# Patient Record
Sex: Female | Born: 1935
Health system: Southern US, Community
[De-identification: ages and names within clinical notes are randomized; demographics above are authoritative.]

## PROBLEM LIST (undated history)

## (undated) DIAGNOSIS — L309 Dermatitis, unspecified: Secondary | ICD-10-CM

## (undated) DIAGNOSIS — R911 Solitary pulmonary nodule: Secondary | ICD-10-CM

## (undated) DIAGNOSIS — K219 Gastro-esophageal reflux disease without esophagitis: Secondary | ICD-10-CM

## (undated) DIAGNOSIS — M1990 Unspecified osteoarthritis, unspecified site: Secondary | ICD-10-CM

## (undated) DIAGNOSIS — J449 Chronic obstructive pulmonary disease, unspecified: Secondary | ICD-10-CM

## (undated) DIAGNOSIS — I10 Essential (primary) hypertension: Secondary | ICD-10-CM

## (undated) DIAGNOSIS — K449 Diaphragmatic hernia without obstruction or gangrene: Secondary | ICD-10-CM

## (undated) DIAGNOSIS — IMO0002 Reserved for concepts with insufficient information to code with codable children: Secondary | ICD-10-CM

## (undated) DIAGNOSIS — E785 Hyperlipidemia, unspecified: Secondary | ICD-10-CM

## (undated) DIAGNOSIS — G450 Vertebro-basilar artery syndrome: Secondary | ICD-10-CM

## (undated) DIAGNOSIS — E039 Hypothyroidism, unspecified: Secondary | ICD-10-CM

## (undated) DIAGNOSIS — M353 Polymyalgia rheumatica: Secondary | ICD-10-CM

## (undated) HISTORY — PX: OTHER SURGICAL HISTORY: SHX169

## (undated) HISTORY — DX: Gastro-esophageal reflux disease without esophagitis: K21.9

## (undated) HISTORY — DX: Dermatitis, unspecified: L30.9

## (undated) HISTORY — PX: TOTAL ABDOMINAL HYSTERECTOMY: SHX209

## (undated) HISTORY — DX: Reserved for concepts with insufficient information to code with codable children: IMO0002

## (undated) HISTORY — PX: BREAST CYST EXCISION: SHX579

## (undated) HISTORY — DX: Diaphragmatic hernia without obstruction or gangrene: K44.9

## (undated) HISTORY — PX: CAROTID ENDARTERECTOMY: SUR193

## (undated) HISTORY — DX: Polymyalgia rheumatica: M35.3

## (undated) HISTORY — DX: Essential (primary) hypertension: I10

## (undated) HISTORY — DX: Solitary pulmonary nodule: R91.1

## (undated) HISTORY — PX: RHINOPLASTY: SUR1284

## (undated) HISTORY — DX: Chronic obstructive pulmonary disease, unspecified: J44.9

## (undated) HISTORY — DX: Hyperlipidemia, unspecified: E78.5

## (undated) HISTORY — DX: Hypothyroidism, unspecified: E03.9

## (undated) HISTORY — DX: Vertebro-basilar artery syndrome: G45.0

## (undated) HISTORY — DX: Unspecified osteoarthritis, unspecified site: M19.90

---

## 1999-07-22 ENCOUNTER — Other Ambulatory Visit: Admission: RE | Admit: 1999-07-22 | Discharge: 1999-07-22 | Payer: Self-pay | Admitting: *Deleted

## 2000-09-04 ENCOUNTER — Other Ambulatory Visit: Admission: RE | Admit: 2000-09-04 | Discharge: 2000-09-04 | Payer: Self-pay | Admitting: *Deleted

## 2001-07-01 ENCOUNTER — Ambulatory Visit (HOSPITAL_COMMUNITY): Admission: RE | Admit: 2001-07-01 | Discharge: 2001-07-01 | Payer: Self-pay | Admitting: Gastroenterology

## 2001-09-19 ENCOUNTER — Emergency Department (HOSPITAL_COMMUNITY): Admission: EM | Admit: 2001-09-19 | Discharge: 2001-09-19 | Payer: Self-pay | Admitting: Emergency Medicine

## 2002-12-01 ENCOUNTER — Encounter: Payer: Self-pay | Admitting: Internal Medicine

## 2002-12-01 ENCOUNTER — Encounter: Admission: RE | Admit: 2002-12-01 | Discharge: 2002-12-01 | Payer: Self-pay | Admitting: Internal Medicine

## 2002-12-30 ENCOUNTER — Encounter: Payer: Self-pay | Admitting: Internal Medicine

## 2002-12-30 ENCOUNTER — Encounter: Admission: RE | Admit: 2002-12-30 | Discharge: 2002-12-30 | Payer: Self-pay | Admitting: Internal Medicine

## 2003-02-23 ENCOUNTER — Ambulatory Visit (HOSPITAL_COMMUNITY): Admission: RE | Admit: 2003-02-23 | Discharge: 2003-02-23 | Payer: Self-pay | Admitting: Gastroenterology

## 2003-04-05 ENCOUNTER — Encounter: Admission: RE | Admit: 2003-04-05 | Discharge: 2003-04-05 | Payer: Self-pay | Admitting: Gastroenterology

## 2003-10-26 ENCOUNTER — Encounter: Admission: RE | Admit: 2003-10-26 | Discharge: 2003-10-26 | Payer: Self-pay | Admitting: Internal Medicine

## 2003-11-02 ENCOUNTER — Encounter: Admission: RE | Admit: 2003-11-02 | Discharge: 2003-11-16 | Payer: Self-pay | Admitting: Internal Medicine

## 2005-11-10 ENCOUNTER — Encounter: Admission: RE | Admit: 2005-11-10 | Discharge: 2005-11-10 | Payer: Self-pay | Admitting: Internal Medicine

## 2008-12-22 ENCOUNTER — Encounter: Payer: Self-pay | Admitting: Pulmonary Disease

## 2009-01-02 ENCOUNTER — Encounter (INDEPENDENT_AMBULATORY_CARE_PROVIDER_SITE_OTHER): Payer: Self-pay | Admitting: Interventional Radiology

## 2009-01-02 ENCOUNTER — Ambulatory Visit (HOSPITAL_COMMUNITY): Admission: RE | Admit: 2009-01-02 | Discharge: 2009-01-02 | Payer: Self-pay | Admitting: Internal Medicine

## 2009-01-31 ENCOUNTER — Ambulatory Visit: Payer: Self-pay | Admitting: Pulmonary Disease

## 2009-01-31 DIAGNOSIS — M353 Polymyalgia rheumatica: Secondary | ICD-10-CM | POA: Insufficient documentation

## 2009-01-31 DIAGNOSIS — M1990 Unspecified osteoarthritis, unspecified site: Secondary | ICD-10-CM | POA: Insufficient documentation

## 2009-01-31 DIAGNOSIS — J984 Other disorders of lung: Secondary | ICD-10-CM | POA: Insufficient documentation

## 2009-01-31 DIAGNOSIS — K449 Diaphragmatic hernia without obstruction or gangrene: Secondary | ICD-10-CM | POA: Insufficient documentation

## 2009-01-31 DIAGNOSIS — L259 Unspecified contact dermatitis, unspecified cause: Secondary | ICD-10-CM | POA: Insufficient documentation

## 2009-01-31 DIAGNOSIS — E785 Hyperlipidemia, unspecified: Secondary | ICD-10-CM | POA: Insufficient documentation

## 2009-01-31 DIAGNOSIS — IMO0002 Reserved for concepts with insufficient information to code with codable children: Secondary | ICD-10-CM | POA: Insufficient documentation

## 2009-01-31 DIAGNOSIS — I10 Essential (primary) hypertension: Secondary | ICD-10-CM | POA: Insufficient documentation

## 2009-01-31 DIAGNOSIS — E039 Hypothyroidism, unspecified: Secondary | ICD-10-CM | POA: Insufficient documentation

## 2009-01-31 DIAGNOSIS — K219 Gastro-esophageal reflux disease without esophagitis: Secondary | ICD-10-CM | POA: Insufficient documentation

## 2009-02-02 ENCOUNTER — Ambulatory Visit: Payer: Self-pay | Admitting: Pulmonary Disease

## 2009-02-24 ENCOUNTER — Encounter: Payer: Self-pay | Admitting: Pulmonary Disease

## 2009-02-27 ENCOUNTER — Telehealth: Payer: Self-pay | Admitting: Pulmonary Disease

## 2009-03-05 ENCOUNTER — Ambulatory Visit: Payer: Self-pay | Admitting: Cardiovascular Disease

## 2009-03-09 ENCOUNTER — Ambulatory Visit: Payer: Self-pay | Admitting: Pulmonary Disease

## 2009-03-09 DIAGNOSIS — J479 Bronchiectasis, uncomplicated: Secondary | ICD-10-CM | POA: Insufficient documentation

## 2009-03-09 DIAGNOSIS — J449 Chronic obstructive pulmonary disease, unspecified: Secondary | ICD-10-CM | POA: Insufficient documentation

## 2009-04-26 ENCOUNTER — Ambulatory Visit: Payer: Self-pay | Admitting: Pulmonary Disease

## 2009-05-08 ENCOUNTER — Telehealth (INDEPENDENT_AMBULATORY_CARE_PROVIDER_SITE_OTHER): Payer: Self-pay | Admitting: *Deleted

## 2009-05-08 ENCOUNTER — Ambulatory Visit: Payer: Self-pay | Admitting: Internal Medicine

## 2009-06-28 ENCOUNTER — Ambulatory Visit: Payer: Self-pay | Admitting: Cardiology

## 2009-07-06 ENCOUNTER — Ambulatory Visit: Payer: Self-pay | Admitting: Pulmonary Disease

## 2009-07-06 DIAGNOSIS — K117 Disturbances of salivary secretion: Secondary | ICD-10-CM | POA: Insufficient documentation

## 2009-12-26 ENCOUNTER — Ambulatory Visit: Payer: Self-pay | Admitting: Cardiology

## 2009-12-28 ENCOUNTER — Ambulatory Visit: Payer: Self-pay | Admitting: Pulmonary Disease

## 2010-06-20 NOTE — Assessment & Plan Note (Signed)
Summary: f/u CT chest results/LC   Visit Type:  Follow-up Primary Sibel Khurana/Referring Maylee Bare:  Dr. Creola Corn  CC:  Patient here to discuss CT scan results.  The patient c/o increased sob with exertion and when outside in the heat/humidity..  History of Present Illness: 77 yo female with COPD, bronchiectasis, and pulmonary nodule.  Her breathing has been doing okay.  She has occasional cough and clear sputum.  She denies fever, epistaxis, or hemoptysis.  She notices some trouble with her breathing with the hot weather, but otherwise feels like she is able to do most of her activities w/o too much breathing trouble.  She does not get wheezing much, and has not needed to use her rescue inhaler.  She is now down to 5 mg prednisone per day.  CT of Chest  Procedure date:  12/26/2009  Findings:      CT CHEST WITHOUT CONTRAST   Technique:  Multidetector CT imaging of the chest was performed following the standard protocol without IV contrast.   Comparison: 06/28/2009 and 03/05/2009   Findings: There is no axillary lymphadenopathy.  No mediastinal or hilar lymphadenopathy.  Heart size is normal.  No pericardial or pleural effusion.  Coronary artery calcification is noted.   There is some bronchiectasis in the posterior right upper lobe. Spiculated posterior right upper lobe pulmonary nodule may have some associated mineralization, and measures 7 x 10 mm today, stable since the previous study when measured 8 x 11 mm and unchanged when comparing back to the study from 03/05/2009.   Bronchiectasis and scarring again noted in the right middle lobe. There is some bronchiectatic change with nodular scarring seen in the anterior right lower lobe.  4 mm posterior right lower lobe pulmonary nodule on image 35 is unchanged.   Bronchiectasis and scarring in the posterior lingula is unchanged. Tiny posteromedial left lower lobe pulmonary nodule is less apparent on today's study.     IMPRESSION: The dominant 7 x 10 mm right upper lobe pulmonary nodule is unchanged in the nearly 1-year interval since our oldest comparison study.  This is reassuring.  An additional follow-up CT chest without contrast in 12 months is recommended.   Scattered areas of bronchiectasis and nodular scarring are seen in the lower lungs bilaterally, unchanged.  Imaging features are probably related to previous atypical infection.   Current Medications (verified): 1)  Spiriva Handihaler 18 Mcg Caps (Tiotropium Bromide Monohydrate) .... One Puff Once Daily 2)  Symbicort 160-4.5 Mcg/act Aero (Budesonide-Formoterol Fumarate) .... Two Puffs Two Times A Day 3)  Proair Hfa 108 (90 Base) Mcg/act Aers (Albuterol Sulfate) .... Two Puffs Up To Four Times Per Day As Needed 4)  Toprol Xl 50 Mg Xr24h-Tab (Metoprolol Succinate) .Marland Kitchen.. 1 By Mouth Daily 5)  Aspir-Low 81 Mg Tbec (Aspirin) .Marland Kitchen.. 1 By Mouth Daily 6)  Amitriptyline Hcl 50 Mg Tabs (Amitriptyline Hcl) .Marland Kitchen.. 1 By Mouth Daily 7)  Multivitamins  Tabs (Multiple Vitamin) .Marland Kitchen.. 1 By Mouth Daily 8)  Synthroid 88 Mcg Tabs (Levothyroxine Sodium) .Marland Kitchen.. 1 By Mouth Daily 9)  Prilosec 20 Mg Cpdr (Omeprazole) .Marland Kitchen.. 1 By Mouth Daily As Needed 10)  Prednisone 5 Mg Tabs (Prednisone) .Marland Kitchen.. 1 By Mouth Daily  Allergies (verified): 1)  ! Sulfa  Past History:  Past Medical History: Polymyalgia rheumatica Degenerative disk disase Osteoarthritis Eczema Hiatal hernia GERD Gastric ulcer 2004 Hypothyroidism Hypertension Hyperlipidemia Vertebrobasilar insufficiency COPD       - PFT 02/02/09 FEV1 0.99(53%), FVC 1.58(58%), FEV1% 63, TLC 3.79(79%), DLCO 51%, +  BD Bronchiectasis Right upper lobe pulmonary nodule      - 9 x 12 mm CT chest 12/22/08, 03/05/09, 06/28/09, 12/26/09  Past Surgical History: Reviewed history from 01/31/2009 and no changes required. TAH  1975 Benign breast cysts rhinoplasty  1976 Broken tail bone Carotid Endarterectomy Right  Vital  Signs:  Patient profile:   77 year old female Height:      63 inches (160.02 cm) Weight:      166.38 pounds (75.63 kg) BMI:     29.58 O2 Sat:      94 % on Room air Temp:     97.9 degrees F (36.61 degrees C) oral Pulse rate:   91 / minute BP sitting:   130 / 68  (left arm) Cuff size:   regular  Vitals Entered By: Michel Bickers CMA (December 28, 2009 3:18 PM)  O2 Sat at Rest %:  94 O2 Flow:  Room air CC: Patient here to discuss CT scan results.  The patient c/o increased sob with exertion and when outside in the heat/humidity. Comments Medications reviewed with the patient. Daytime phone verified. Michel Bickers [facility]  December 28, 2009 3:25 PM   Physical Exam  General:  obese.   Nose:  no deformity, discharge, inflammation, or lesions Mouth:  no deformity or lesions Neck:  no JVD.   Lungs:  prolonged exhalation, no wheeze or crackels Heart:  regular rate and rhythm, S1, S2 without murmurs, rubs, gallops, or clicks Extremities:  no clubbing, cyanosis, edema, or deformity noted Cervical Nodes:  no significant adenopathy   Impression & Recommendations:  Problem # 1:  PULMONARY NODULE, RIGHT UPPER LOBE (ICD-518.89) She has stable radiographic findings.  Will plan to repeat CT chest in one year.  She does not have any symptoms that would suggest an active infection at present.  Problem # 2:  COPD (ICD-496)  She appears stable on her current regimen.  Will see if she can taper off some of her inhalers.  Will have her use spiriva every other day for 2 to 3 weeks.  If she remains stable, she is to then stop spiriva.  Problem # 3:  BRONCHIECTASIS (ICD-494.0)  As above.  Problem # 4:  POLYMYALGIA RHEUMATICA (ICD-725) She is on prednisone per primary care.  Medications Added to Medication List This Visit: 1)  Prilosec 20 Mg Cpdr (Omeprazole) .Marland Kitchen.. 1 by mouth daily as needed  Complete Medication List: 1)  Spiriva Handihaler 18 Mcg Caps (Tiotropium bromide monohydrate) .... One puff once  daily 2)  Symbicort 160-4.5 Mcg/act Aero (Budesonide-formoterol fumarate) .... Two puffs two times a day 3)  Proair Hfa 108 (90 Base) Mcg/act Aers (Albuterol sulfate) .... Two puffs up to four times per day as needed 4)  Toprol Xl 50 Mg Xr24h-tab (Metoprolol succinate) .Marland Kitchen.. 1 by mouth daily 5)  Aspir-low 81 Mg Tbec (Aspirin) .Marland Kitchen.. 1 by mouth daily 6)  Amitriptyline Hcl 50 Mg Tabs (Amitriptyline hcl) .Marland Kitchen.. 1 by mouth daily 7)  Multivitamins Tabs (Multiple vitamin) .Marland Kitchen.. 1 by mouth daily 8)  Synthroid 88 Mcg Tabs (Levothyroxine sodium) .Marland Kitchen.. 1 by mouth daily 9)  Prilosec 20 Mg Cpdr (Omeprazole) .Marland Kitchen.. 1 by mouth daily as needed 10)  Prednisone 5 Mg Tabs (Prednisone) .Marland Kitchen.. 1 by mouth daily  Patient Instructions: 1)  Spiriva every other day for 2 to 3 weeks.  If okay then stop spiriva. 2)  Symbicort two puffs two times a day 3)  Proair two puffs up to four times per day as  needed 4)  CT chest in one year 5)  Follow up in 6 months  Appended Document: f/u CT chest results/LC

## 2010-06-20 NOTE — Assessment & Plan Note (Signed)
Summary: rov after ct/apc   Primary Provider/Referring Provider:  Dr. Creola Corn  CC:  Patient here to discuss Ct chest results. Patients husband passed away this week.Marland Kitchen  History of Present Illness: 77 yo female with COPD, bronchiectasis, and pulmonary nodule.  Her breathing has been doing well.  She does not have much cough, wheeze, or sputum.  She denies fever or hemoptysis.  She has not had chest pain or swelling.  She has been getting dryness in her mouth.  Unfortunately her husband passed away recently after having a stroke.  CT of Chest  Procedure date:  06/28/2009  Findings:      Findings: The chest wall is unremarkable and stable.  No breast masses, supraclavicular or axillary adenopathy.   The heart is normal in size.  No pericardial effusion.  No mediastinal or hilar adenopathy.  Stable atherosclerotic changes involving the aorta but no focal aneurysm.  Coronary artery calcifications are again demonstrated.  The esophagus is grossly normal and stable.   Examination of the lung parenchyma demonstrates stable middle lobe and lingular bronchiectasis and parenchymal scarring.  Most of the small nodules seen on the prior study have resolved.  No change in the right upper lobe nodule which measures 10.0 x 8.0 mm. There is also a small stable right lower lobe nodule which measures 4 mm. There is a small stable calcified granuloma in the right lower lobe on image number 39 and small adjacent stable nodule on image number 40.   No focal pulmonary filtrates, pulmonary edema or pleural effusions.   The upper abdomen is unremarkable.   IMPRESSION:   1.  Persistent right middle lobe and lingular bronchiectasis and parenchymal scarring. 2.  Resolution of several of the smaller nodular opacities seen on the prior study. 3.  The remaining pulmonary nodules are stable.   Current Medications (verified): 1)  Spiriva Handihaler 18 Mcg Caps (Tiotropium Bromide Monohydrate)  .... One Puff Once Daily 2)  Symbicort 160-4.5 Mcg/act Aero (Budesonide-Formoterol Fumarate) .... Two Puffs Two Times A Day 3)  Proair Hfa 108 (90 Base) Mcg/act Aers (Albuterol Sulfate) .... Two Puffs Up To Four Times Per Day As Needed 4)  Toprol Xl 50 Mg Xr24h-Tab (Metoprolol Succinate) .Marland Kitchen.. 1 By Mouth Daily 5)  Aspir-Low 81 Mg Tbec (Aspirin) .Marland Kitchen.. 1 By Mouth Daily 6)  Amitriptyline Hcl 50 Mg Tabs (Amitriptyline Hcl) .Marland Kitchen.. 1 By Mouth Daily 7)  Multivitamins  Tabs (Multiple Vitamin) .Marland Kitchen.. 1 By Mouth Daily 8)  Synthroid 88 Mcg Tabs (Levothyroxine Sodium) .Marland Kitchen.. 1 By Mouth Daily 9)  Prilosec 20 Mg Cpdr (Omeprazole) .Marland Kitchen.. 1 By Mouth Daily 10)  Vitamin D 1000 Unit Tabs (Cholecalciferol) .Marland Kitchen.. 1 By Mouth Daily 11)  Prednisone 5 Mg Tabs (Prednisone) .Marland Kitchen.. 1 By Mouth Daily 12)  Caltrate 600+d 600-400 Mg-Unit Tabs (Calcium Carbonate-Vitamin D) .Marland Kitchen.. 1 By Mouth Two Times A Day 13)  Mucinex 600 Mg Xr12h-Tab (Guaifenesin) .... One By Mouth Two Times A Day As Needed 14)  Diflucan 100 Mg Tabs (Fluconazole) .... One Daily X 3 Days  Allergies (verified): 1)  ! Sulfa  Past History:  Past Surgical History: Last updated: 01/31/2009 TAH  1975 Benign breast cysts rhinoplasty  1976 Broken tail bone Carotid Endarterectomy Right  Past Medical History: Polymyalgia rheumatica Degenerative disk disase Osteoarthritis Eczema Hiatal hernia GERD Gastric ulcer 2004 Hypothyroidism Hypertension Hyperlipidemia Vertebrobasilar insufficiency COPD       - PFT 02/02/09 FEV1 0.99(53%), FVC 1.58(58%), FEV1% 63, TLC 3.79(79%), DLCO 51%, +BD Bronchiectasis Right upper lobe  pulmonary nodule      - 9 x 12 mm CT chest 12/22/08, 03/05/09, 06/28/09  Social History: Smoked 2 packs per day for 2 years.  Quit in 1960's. No alcohol use Widowed Worked staining glass  Vital Signs:  Patient profile:   77 year old female Height:      63 inches (160.02 cm) Weight:      168 pounds (76.36 kg) BMI:     29.87 O2 Sat:      92 %  on Room air Temp:     97.9 degrees F (36.61 degrees C) oral Pulse rate:   104 / minute BP sitting:   144 / 80  (right arm) Cuff size:   regular  Vitals Entered By: Michel Bickers CMA (July 06, 2009 1:33 PM)  O2 Sat at Rest %:  92 O2 Flow:  Room air  Physical Exam  General:  obese.   Nose:  no deformity, discharge, inflammation, or lesions Mouth:  no deformity or lesions Neck:  no JVD.   Lungs:  prolonged exhalation, no wheeze or crackels Heart:  regular rate and rhythm, S1, S2 without murmurs, rubs, gallops, or clicks Abdomen:  bowel sounds positive; abdomen soft and non-tender without masses, or organomegaly Extremities:  no clubbing, cyanosis, edema, or deformity noted Cervical Nodes:  no significant adenopathy   Impression & Recommendations:  Problem # 1:  COPD (ICD-496) She appears stable on her current regimen.  Problem # 2:  BRONCHIECTASIS (ICD-494.0) As above.  Problem # 3:  PULMONARY NODULE, RIGHT UPPER LOBE (ICD-518.89) He CT chest showed stable appearance of nodule.  Will plan to repeat CT chest in August 2011.  Problem # 4:  DRY MOUTH (ICD-527.7) She has noticed dry mouth.  I explained that use of elavil can contribute to this.  Advised that she should follow up with her primary care for further assessment of this.  Complete Medication List: 1)  Spiriva Handihaler 18 Mcg Caps (Tiotropium bromide monohydrate) .... One puff once daily 2)  Symbicort 160-4.5 Mcg/act Aero (Budesonide-formoterol fumarate) .... Two puffs two times a day 3)  Proair Hfa 108 (90 Base) Mcg/act Aers (Albuterol sulfate) .... Two puffs up to four times per day as needed 4)  Toprol Xl 50 Mg Xr24h-tab (Metoprolol succinate) .Marland Kitchen.. 1 by mouth daily 5)  Aspir-low 81 Mg Tbec (Aspirin) .Marland Kitchen.. 1 by mouth daily 6)  Amitriptyline Hcl 50 Mg Tabs (Amitriptyline hcl) .Marland Kitchen.. 1 by mouth daily 7)  Multivitamins Tabs (Multiple vitamin) .Marland Kitchen.. 1 by mouth daily 8)  Synthroid 88 Mcg Tabs (Levothyroxine sodium) .Marland Kitchen..  1 by mouth daily 9)  Prilosec 20 Mg Cpdr (Omeprazole) .Marland Kitchen.. 1 by mouth daily 10)  Vitamin D 1000 Unit Tabs (Cholecalciferol) .Marland Kitchen.. 1 by mouth daily 11)  Prednisone 5 Mg Tabs (Prednisone) .Marland Kitchen.. 1 by mouth daily 12)  Caltrate 600+d 600-400 Mg-unit Tabs (Calcium carbonate-vitamin d) .Marland Kitchen.. 1 by mouth two times a day 13)  Mucinex 600 Mg Xr12h-tab (Guaifenesin) .... One by mouth two times a day as needed 14)  Diflucan 100 Mg Tabs (Fluconazole) .... One daily x 3 days  Other Orders: Radiology Referral (Radiology) Est. Patient Level III (20254)  Patient Instructions: 1)  Repeat CT chest in 6 months 2)  Follow up after CT chest   Immunization History:  Pneumovax Immunization History:    Pneumovax:  historical (01/31/2009)

## 2010-07-08 ENCOUNTER — Ambulatory Visit (INDEPENDENT_AMBULATORY_CARE_PROVIDER_SITE_OTHER): Payer: MEDICARE | Admitting: Pulmonary Disease

## 2010-07-08 ENCOUNTER — Encounter: Payer: Self-pay | Admitting: Pulmonary Disease

## 2010-07-08 DIAGNOSIS — M353 Polymyalgia rheumatica: Secondary | ICD-10-CM

## 2010-07-08 DIAGNOSIS — J449 Chronic obstructive pulmonary disease, unspecified: Secondary | ICD-10-CM

## 2010-07-08 DIAGNOSIS — J984 Other disorders of lung: Secondary | ICD-10-CM

## 2010-07-09 ENCOUNTER — Other Ambulatory Visit: Payer: Self-pay | Admitting: Pulmonary Disease

## 2010-07-09 DIAGNOSIS — R911 Solitary pulmonary nodule: Secondary | ICD-10-CM

## 2010-07-16 NOTE — Assessment & Plan Note (Signed)
Summary: rov//sh   Primary Provider/Referring Provider:  Dr. Creola Corn  CC:  follow up. Pt states she has had an increase in sob x few days and feels tired. Marland Kitchen  History of Present Illness: 77 yo female with COPD, bronchiectasis, and pulmonary nodule.  Since her last visit she stopped spiriva.  She has noticed more trouble with her breathing, especially over the last few days.  She does not feel more congested, and has not been coughing more than usual.  She feels like she gets a rattle in her chest.  She denies fever, and her sinuses have been okay.  She denies sick exposures.  She is currently on 4 mg prednisone once daily.  She has not been using proair.  She was not sure if she could use it except in emergencies.  Current Medications (verified): 1)  Symbicort 160-4.5 Mcg/act Aero (Budesonide-Formoterol Fumarate) .... Two Puffs Two Times A Day 2)  Proair Hfa 108 (90 Base) Mcg/act Aers (Albuterol Sulfate) .... Two Puffs Up To Four Times Per Day As Needed 3)  Toprol Xl 50 Mg Xr24h-Tab (Metoprolol Succinate) .Marland Kitchen.. 1 By Mouth Daily 4)  Aspir-Low 81 Mg Tbec (Aspirin) .Marland Kitchen.. 1 By Mouth Daily 5)  Amitriptyline Hcl 50 Mg Tabs (Amitriptyline Hcl) .Marland Kitchen.. 1 By Mouth Daily 6)  Multivitamins  Tabs (Multiple Vitamin) .Marland Kitchen.. 1 By Mouth Daily 7)  Synthroid 75 Mcg Tabs (Levothyroxine Sodium) .... Once Daily 8)  Prilosec 20 Mg Cpdr (Omeprazole) .Marland Kitchen.. 1 By Mouth Daily As Needed 9)  Prednisone 1 Mg Tabs (Prednisone) .... 4 Tabs Daily  Allergies (verified): 1)  ! Sulfa  Past History:  Past Medical History: Reviewed history from 12/28/2009 and no changes required. Polymyalgia rheumatica Degenerative disk disase Osteoarthritis Eczema Hiatal hernia GERD Gastric ulcer 2004 Hypothyroidism Hypertension Hyperlipidemia Vertebrobasilar insufficiency COPD       - PFT 02/02/09 FEV1 0.99(53%), FVC 1.58(58%), FEV1% 63, TLC 3.79(79%), DLCO 51%, +BD Bronchiectasis Right upper lobe pulmonary nodule      - 9 x 12  mm CT chest 12/22/08, 03/05/09, 06/28/09, 12/26/09  Past Surgical History: Reviewed history from 01/31/2009 and no changes required. TAH  1975 Benign breast cysts rhinoplasty  1976 Broken tail bone Carotid Endarterectomy Right  Family History: Reviewed history from 01/31/2009 and no changes required. Family History COPD -- mother Family History Breast Cancer -- sister Liver failure -- son Family History Emphysema --mother and father Family History MI/Heart Attack--father  Social History: Reviewed history from 07/06/2009 and no changes required. Smoked 2 packs per day for 2 years.  Quit in 1960's. No alcohol use Widowed Worked staining glass  Vital Signs:  Patient profile:   77 year old female Height:      63 inches Weight:      158.25 pounds BMI:     28.13 O2 Sat:      90 % on Room air Temp:     98.3 degrees F oral Pulse rate:   85 / minute BP sitting:   140 / 70  (left arm) Cuff size:   regular  Vitals Entered By: Carver Fila (July 08, 2010 3:59 PM)  O2 Flow:  Room air CC: follow up. Pt states she has had an increase in sob x few days and feels tired.  Comments meds and allergies updated Phone number updated Carver Fila  July 08, 2010 3:59 PM    Physical Exam  General:  obese.   Nose:  no deformity, discharge, inflammation, or lesions Mouth:  no deformity or lesions  Neck:  no JVD.   Lungs:  prolonged exhalation, no wheeze or crackels Heart:  regular rate and rhythm, S1, S2 without murmurs, rubs, gallops, or clicks Extremities:  no clubbing, cyanosis, edema, or deformity noted Neurologic:  normal CN II-XII.   Cervical Nodes:  no significant adenopathy   Impression & Recommendations:  Problem # 1:  COPD (ICD-496) Will resume her spiriva.  She is to continue symbicort.  Advised her that it is okay to use her proair on a more regular basis if needed.  Problem # 2:  BRONCHIECTASIS (ICD-494.0) As above.  Problem # 3:  PULMONARY NODULE, RIGHT UPPER  LOBE (ICD-518.89)  She will need CT chest follow up in August 2012  Problem # 4:  POLYMYALGIA RHEUMATICA (ICD-725)  She is on prednisone per primary care.  Medications Added to Medication List This Visit: 1)  Spiriva Handihaler 18 Mcg Caps (Tiotropium bromide monohydrate) .... One puff once daily 2)  Synthroid 75 Mcg Tabs (Levothyroxine sodium) .... Once daily 3)  Prednisone 1 Mg Tabs (Prednisone) .... 4 tabs daily  Complete Medication List: 1)  Spiriva Handihaler 18 Mcg Caps (Tiotropium bromide monohydrate) .... One puff once daily 2)  Symbicort 160-4.5 Mcg/act Aero (Budesonide-formoterol fumarate) .... Two puffs two times a day 3)  Proair Hfa 108 (90 Base) Mcg/act Aers (Albuterol sulfate) .... Two puffs up to four times per day as needed 4)  Toprol Xl 50 Mg Xr24h-tab (Metoprolol succinate) .Marland Kitchen.. 1 by mouth daily 5)  Aspir-low 81 Mg Tbec (Aspirin) .Marland Kitchen.. 1 by mouth daily 6)  Amitriptyline Hcl 50 Mg Tabs (Amitriptyline hcl) .Marland Kitchen.. 1 by mouth daily 7)  Multivitamins Tabs (Multiple vitamin) .Marland Kitchen.. 1 by mouth daily 8)  Synthroid 75 Mcg Tabs (Levothyroxine sodium) .... Once daily 9)  Prilosec 20 Mg Cpdr (Omeprazole) .Marland Kitchen.. 1 by mouth daily as needed 10)  Prednisone 1 Mg Tabs (Prednisone) .... 4 tabs daily  Other Orders: Radiology Referral (Radiology) Est. Patient Level IV (60454)  Patient Instructions: 1)  Restart spiriva one puff once daily  2)  Okay to use proair up to four times per day as needed for cough, wheeze, chest congestion, or shortness of breath 3)  Follow up in August 2012 after CT chest Prescriptions: SPIRIVA HANDIHALER 18 MCG CAPS (TIOTROPIUM BROMIDE MONOHYDRATE) one puff once daily  #30 x 6   Entered and Authorized by:   [name] MD   Signed by:   [name] MD on 07/08/2010   Method used:   Electronically to        [facility]. [phone]* (retail)       [phone]       [place], [place]  [address]       Ph: [phone]        Fax: [phone]   RxID:   [id number]    Immunization History:  Influenza Immunization History:    Influenza:  historical (02/16/2010)   Appended Document: walk update Ambulatory Pulse Oximetry  Resting; HR__81___    02 93%ra  Lap1 (185 feet)   HR_88____   02 Sat__93%ra___ Lap2 (185 feet)   HR_91____   02 Sat__94%ra___    Lap3 (185 feet)   HR_96____   02 Sat___94%ra__  _x__Test Completed without Difficulty ___Test Stopped due to: Carver Fila  July 08, 2010 4:52 PM      Clinical Lists Changes

## 2010-08-24 LAB — CBC
HCT: 43.5 % (ref 36.0–46.0)
Hemoglobin: 14.3 g/dL (ref 12.0–15.0)
MCHC: 32.9 g/dL (ref 30.0–36.0)
MCV: 94.5 fL (ref 78.0–100.0)
Platelets: 266 10*3/uL (ref 150–400)
RBC: 4.6 MIL/uL (ref 3.87–5.11)
RDW: 13.9 % (ref 11.5–15.5)
WBC: 10.1 10*3/uL (ref 4.0–10.5)

## 2010-08-24 LAB — PROTIME-INR
INR: 1 (ref 0.00–1.49)
Prothrombin Time: 13.3 seconds (ref 11.6–15.2)

## 2010-08-24 LAB — APTT: aPTT: 29 seconds (ref 24–37)

## 2010-10-04 NOTE — Op Note (Signed)
   Jenna Carrillo, Jenna Carrillo                         ACCOUNT NO.:  [id number]   MEDICAL RECORD NO.:  [id number]                   PATIENT TYPE:  AMB   LOCATION:  ENDO                                 [facility]ACILITY[facility]  [facility]   PHYSICIAN:  James L. Malon Kindle., M.D.          DATE OF BIRTH:  [birth year]   DATE OF PROCEDURE:  02/23/2003  DATE OF DISCHARGE:                                 OPERATIVE REPORT   PROCEDURE:  Esophagogastroduodenoscopy with biopsy.   MEDICATIONS:  1. Cetacaine spray.  2. Fentanyl 50 mcg.  3. Versed __________ mg IV.   INDICATION:  Persistent epigastric pain despite fairly normal upper GI and  gallbladder ultrasound.   DESCRIPTION OF PROCEDURE:  The procedure explained to the patient and  consent obtained.  The patient in the left lateral decubitus position, the  Olympus scope was inserted and advanced.  We advanced down into the stomach.  The ileocecal valve was identified and passed.  The duodenum including the  bulb and second portion were seen well.  There were no ulcerations or  inflammation.  The scope was withdrawn.  The pyloric channel was normal.  On  the anterior wall of the antrum was a 0.5 cm shallow ulcer.  It appeared to  be healing.  In an area away from the ulcer, a biopsy was taken for rapid  urease test for Helicobacter.  No other ulcerations or inflammation was  seen.  The fundus and cardia were seen well in the retroflexed view and were  normal.  The scope was withdrawn.  The distal and proximal esophagus were  seen well and were normal.  The GE junction was widely patent with free  reflux.   ASSESSMENT:  1. Small gastric ulcer, probably the source of her continuing symptoms.  2. Gastroesophageal reflux disease.   PLAN:  1. We will continue on Aciphex b.i.d.  2. Check the results of CLOtest.  3. Keep her off of nonsteroidal drugs.  4. We will see her back in the office in two months.  We will need a follow-     up upper GI.                                 James L. Malon Kindle., M.D.    Waldron Session  D:  02/23/2003  T:  02/23/2003  Job:  [id number]   cc:   Gwen Pounds, M.D.  [address]  [place]  [place] [address]  Fax: [phone]

## 2010-10-04 NOTE — Procedures (Signed)
[place]  Patient:    Jenna Carrillo, Jenna Carrillo Visit Number: [id number] MRN: [id number]          Service Type: END Location: ENDO Attending Physician:  Orland Mustard Dictated by:   Llana Aliment. Randa Evens, M.D. Proc. Date: 07/01/01 Admit Date:  07/01/2001   CC:         Creola Corn, MD   Procedure Report  DATE OF BIRTH:  05-10-1931  PROCEDURE PERFORMED:  Colonoscopy.  ENDOSCOPIST:  Llana Aliment. Randa Evens, M.D.  MEDICATIONS USED:  Fentanyl 75 mcg, Versed 7.5 mg IV.  INSTRUMENT:  Olympus pediatric colonoscope.  INDICATIONS:  Strong family history of colon cancer.  DESCRIPTION OF PROCEDURE:  The procedure had been explained to the patient and consent obtained.  With the patient in the left lateral decubitus position, the Olympus pediatric video colonoscope was inserted and advanced under direct visualization.  The prep was excellent and we were able to advance using abdominal pressure and multiple position changes to the cecum.  The ileocecal valve was identified.  The scope was withdrawn.  The cecum, ascending colon, hepatic flexure, transverse colon, splenic flexure, descending and sigmoid colon were seen well upon withdrawal.  No polyps were seen, no significant diverticular disease.  The patient tolerated the procedure well.  Maintained on low flow oxygen and pulse oximeter throughout the procedure. ASSESSMENT:  Essentially normal colonoscopy to the cecum.  PLAN:  Will recommend repeating in five years. Dictated by:   Llana Aliment. Randa Evens, M.D. Attending Physician:  Orland Mustard DD:  07/01/01 TD:  07/01/01 Job: [id number]

## 2010-12-12 ENCOUNTER — Other Ambulatory Visit: Payer: Self-pay | Admitting: Pulmonary Disease

## 2011-01-02 ENCOUNTER — Ambulatory Visit (INDEPENDENT_AMBULATORY_CARE_PROVIDER_SITE_OTHER)
Admission: RE | Admit: 2011-01-02 | Discharge: 2011-01-02 | Disposition: A | Discharge status: Home / Self Care | Payer: Medicare Other | Source: Ambulatory Visit | Attending: Pulmonary Disease | Admitting: Pulmonary Disease

## 2011-01-02 ENCOUNTER — Telehealth: Payer: Self-pay | Admitting: Pulmonary Disease

## 2011-01-02 DIAGNOSIS — R911 Solitary pulmonary nodule: Secondary | ICD-10-CM

## 2011-01-02 DIAGNOSIS — J984 Other disorders of lung: Secondary | ICD-10-CM

## 2011-01-02 NOTE — Telephone Encounter (Signed)
ATC Melissa back from Radiology.  The above # left was incorrect.  This was the # for a supervisor at [facility] and he was unaware of anyone named Melissa in his dept.  ATC [address] where pt had Ct done but was informed by Misty Stanley there is no one named Melissa there either.  Therefore will sign off on this message and wait for [facility] to call back.

## 2011-01-03 ENCOUNTER — Telehealth: Payer: Self-pay | Admitting: Pulmonary Disease

## 2011-01-03 DIAGNOSIS — J984 Other disorders of lung: Secondary | ICD-10-CM

## 2011-01-03 DIAGNOSIS — J449 Chronic obstructive pulmonary disease, unspecified: Secondary | ICD-10-CM

## 2011-01-03 NOTE — Telephone Encounter (Signed)
Pt is coming in 8/23 at 9 am to discuss results.

## 2011-01-03 NOTE — Telephone Encounter (Signed)
CT chest 01/02/11>>11x7 mm spiculated RUL nodule, 4 mm RLL post nodule, 6 mm RLL nodule with central calcification, patchy opacity with BTX RML and lingula  Will have my nurse schedule ROV to discuss results.

## 2011-01-08 ENCOUNTER — Encounter: Payer: Self-pay | Admitting: Pulmonary Disease

## 2011-01-09 ENCOUNTER — Encounter: Payer: Self-pay | Admitting: Pulmonary Disease

## 2011-01-09 ENCOUNTER — Telehealth: Payer: Self-pay | Admitting: Pulmonary Disease

## 2011-01-09 ENCOUNTER — Other Ambulatory Visit: Payer: Self-pay | Admitting: Pulmonary Disease

## 2011-01-09 ENCOUNTER — Ambulatory Visit (INDEPENDENT_AMBULATORY_CARE_PROVIDER_SITE_OTHER): Payer: Medicare Other | Admitting: Pulmonary Disease

## 2011-01-09 DIAGNOSIS — J449 Chronic obstructive pulmonary disease, unspecified: Secondary | ICD-10-CM

## 2011-01-09 DIAGNOSIS — J984 Other disorders of lung: Secondary | ICD-10-CM

## 2011-01-09 DIAGNOSIS — M353 Polymyalgia rheumatica: Secondary | ICD-10-CM

## 2011-01-09 MED ORDER — ALBUTEROL SULFATE (2.5 MG/3ML) 0.083% IN NEBU: INHALATION_SOLUTION | RESPIRATORY_TRACT | Status: DC

## 2011-01-09 MED ORDER — BUDESONIDE 0.25 MG/2ML IN SUSP: 0.2500 mg | Freq: Two times a day (BID) | RESPIRATORY_TRACT | Status: DC

## 2011-01-09 NOTE — Patient Instructions (Signed)
Stop symbicort Albuterol one vial nebulized twice per day followed by budesonide one vial nebulized twice per day (morning and night) Continue spiriva one puff per day Proair two puffs as needed for cough, wheezing, or chest congestion Follow up in 6 months

## 2011-01-09 NOTE — Assessment & Plan Note (Signed)
She is on prednisone per primary care. 

## 2011-01-09 NOTE — Assessment & Plan Note (Signed)
As above.

## 2011-01-09 NOTE — Assessment & Plan Note (Addendum)
She has increasing dyspnea.  I am worried she may not be using her inhalers effectively.  Will have her stop symbicort.  Will arrange for home nebulizer and start scheduled albuterol and budesonide.  She is to continue spiriva.  Advised her to call if this does not help.

## 2011-01-09 NOTE — Progress Notes (Signed)
Subjective:    Patient ID: Jenna Carrillo, female    DOB: 1931-04-16, 77 y.o.   MRN: [id number]  HPI  77 yo female with COPD, bronchiectasis, and pulmonary nodule.   She is here to follow up CT chest.    She has been getting more short of breath and needing to use her proair more often.  She has been using spiriva and symbicort.  She has a cough with chest congestion.  She gets occasional wheeze, but no fever or hemoptysis.  She has been feeling more tired.  She has been under stress related to her home estate planning.  Past Medical History  Diagnosis Date  . Polymyalgia rheumatica   . Degenerative disk disease   . Osteoarthritis   . Eczema   . Hiatal hernia   . GERD (gastroesophageal reflux disease)   . Gastric ulcer   . Hypothyroidism   . Hypertension   . Hyperlipidemia   . Vertebrobasilar insufficiency   . COPD (chronic obstructive pulmonary disease)   . Bronchiectasis   . Pulmonary nodule, right      Family History  Problem Relation Age of Onset  . COPD Mother   . Breast cancer Mother   . Liver disease Son   . Emphysema Mother   . Emphysema Father   . Heart attack Father      History    Occupational History  .      staining glass   Social History Main Topics  . Smoking status: Former Smoker -- 2.0 packs/day for 2 years    Types: Cigarettes    Quit date: 05/19/1958  . Smokeless tobacco: Not on file  . Alcohol Use: Not on file  . Drug Use: Not on file  . Sexually Active: Not on file   Allergies  Allergen Reactions  . Sulfonamide Derivatives     Review of Systems     Objective:   Physical Exam BP 128/68  Pulse 68  Temp(Src) 97.5 F (36.4 C) (Oral)  Ht 5' 3.5" (1.613 m)  Wt 154 lb 6.4 oz (70.035 kg)  BMI 26.92 kg/m2  SpO2 96%  General: obese.  Nose: no deformity, discharge, inflammation, or lesions  Mouth: no deformity or lesions  Neck: no JVD.  Lungs: prolonged exhalation, no wheeze or crackels  Heart: regular rate and rhythm, S1, S2  without murmurs, rubs, gallops, or clicks  Extremities: no clubbing, cyanosis, edema, or deformity noted  Neurologic: normal CN II-XII.  Cervical Nodes: no significant adenopathy   CT CHEST WITHOUT CONTRAST 12/24/10:  Technique: Multidetector CT imaging of the chest was performed following the standard protocol without IV contrast.   Comparison: None.  Findings: The visualized thyroid is unremarkable.  11 x 7 mm spiculated opacity in the right upper lobe (series 3/image 20). 4 mm nodule in the posterior right lower lobe (series 3/image 36). 6 mm pulmonary nodule with central calcification in the right lower lobe (series 3/image 42). Additional smaller tiny pulmonary nodules bilaterally.  Patchy opacity with bronchiectasis in the right middle lobe and lingula, likely the sequela of prior atypical infection such as MAI.  No pleural effusion or pneumothorax. Heart is normal in size. No pericardial effusion. Coronary atherosclerosis. Atherosclerotic calcifications of the aortic arch.  No suspicious mediastinal or axillary lymphadenopathy.  Visualized upper abdomen is notable for hepatic steatosis.  Degenerative changes of the visualized thoracolumbar spine.   IMPRESSION:  11 mm spiculated opacity in the right upper lobe. In the absence of prior imaging for  comparison, the appearance is worrisome for neoplasm, and PET CT or possible biopsy is suggested. If prior imaging becomes available and is submitted for comparison, this report can be addended with a comparison.  Additional smaller bilateral pulmonary nodules, measuring up to 6 mm in the right lower lobe.  Patchy opacity with bronchiectasis in the right middle lobe and lingula, likely the sequela of prior atypical infection such as MAI.  Spirometry 01/09/11>>FEV1 0.97 (49%), FEV1% 67    Assessment & Plan:   PULMONARY NODULE, RIGHT UPPER LOBE Rt upper lobe nodule was 9x12 mm on CT chest from December 22 2008.  It is now 11x7 mm on CT chest from  January 02, 2011, so essentially unchanged.  No further interventions are needed for this.  COPD She has increasing dyspnea.  I am worried she may not be using her inhalers effectively.  Will have her stop symbicort.  Will arrange for home nebulizer and start scheduled albuterol and budesonide.  She is to continue spiriva.  Advised her to call if this does not help.  BRONCHIECTASIS As above.  Polymyalgia rheumatica She is on prednisone per primary care.    Updated Medication List Outpatient Encounter Prescriptions as of 01/09/2011  Medication Sig Dispense Refill  . albuterol (PROAIR HFA) 108 (90 BASE) MCG/ACT inhaler Inhale 2 puffs into the lungs every 4 (four) hours as needed.        Marland Kitchen amitriptyline (ELAVIL) 50 MG tablet Take 50 mg by mouth at bedtime.        Marland Kitchen aspirin 81 MG tablet Take 81 mg by mouth daily.        Marland Kitchen levothyroxine (SYNTHROID, LEVOTHROID) 75 MCG tablet Take 88 mcg by mouth daily.       . metoprolol (TOPROL-XL) 50 MG 24 hr tablet Take 50 mg by mouth 2 (two) times daily.       . Multiple Vitamin (MULTIVITAMIN) tablet Take 1 tablet by mouth daily.        Marland Kitchen omeprazole (PRILOSEC) 20 MG capsule Take 20 mg by mouth daily as needed.        . predniSONE (DELTASONE) 1 MG tablet 4 tablets a day       . tiotropium (SPIRIVA) 18 MCG inhalation capsule Place 18 mcg into inhaler and inhale daily.        Marland Kitchen DISCONTD: SYMBICORT 160-4.5 MCG/ACT inhaler INHALE 2 PUFFS BY MOUTH TWICE A DAY  10.2 g  2  . albuterol (PROVENTIL) (2.5 MG/3ML) 0.083% nebulizer solution One vial nebulized twice per day and every 6 hours as needed  75 mL  5  . budesonide (PULMICORT) 0.25 MG/2ML nebulizer solution Take 2 mLs (0.25 mg total) by nebulization 2 (two) times daily.  60 mL  5

## 2011-01-09 NOTE — Telephone Encounter (Signed)
ATC [facility] to verify the msg, the pharm closes at 5 pm. [facility] tomorrow am.

## 2011-01-09 NOTE — Assessment & Plan Note (Signed)
Rt upper lobe nodule was 9x12 mm on CT chest from December 22 2008.  It is now 11x7 mm on CT chest from January 02, 2011, so essentially unchanged.  No further interventions are needed for this.

## 2011-01-10 MED ORDER — BUDESONIDE 0.25 MG/2ML IN SUSP: 0.2500 mg | Freq: Two times a day (BID) | RESPIRATORY_TRACT | Status: DC

## 2011-01-10 MED ORDER — ALBUTEROL SULFATE (2.5 MG/3ML) 0.083% IN NEBU: INHALATION_SOLUTION | RESPIRATORY_TRACT | Status: DC

## 2011-01-10 NOTE — Telephone Encounter (Signed)
Rxs for albuterol and budesonide were sent to [facility] for only a 2 wk supply.  However, per Harriett Sine, they only bill for a 1 month supply.  She is requesting new rxs to cover the 1 month supply  Fax # [phone].  Rxs printed and given to Mindy to have VS sign.

## 2011-01-10 NOTE — Telephone Encounter (Signed)
Rx's has been signed and faxed to # given

## 2011-03-12 ENCOUNTER — Telehealth: Payer: Self-pay | Admitting: Pulmonary Disease

## 2011-03-12 DIAGNOSIS — J449 Chronic obstructive pulmonary disease, unspecified: Secondary | ICD-10-CM

## 2011-03-12 NOTE — Telephone Encounter (Signed)
Called and spoke with pt. Pt states she has had a history of low blood sugar years ago and "knows what the symptoms are."  However she states she doesn't have a way to test her blood sugars.  She states every time she uses albuterol and/or budesonide nebs she gets "weak, shaky and heart races."  Informed pt these are all side effects that the albuterol can cause.   Pt states she "cannot tolerate these symptoms" and requests VS recs.  Vs, please advise.  Thanks.

## 2011-03-13 MED ORDER — ALBUTEROL SULFATE (2.5 MG/3ML) 0.083% IN NEBU: 2.5000 mg | INHALATION_SOLUTION | RESPIRATORY_TRACT | Status: DC | PRN

## 2011-03-13 NOTE — Telephone Encounter (Signed)
Explained to pt that her symptoms are related to using albuterol.  She has been using this once or twice per day because she was instructed to.  She does not feel like she needs to use this otherwise.  Advised her she can use albuterol only as needed, but not on a scheduled basis.  She is to continue spiriva and pulmicort.

## 2011-07-22 ENCOUNTER — Other Ambulatory Visit: Payer: Self-pay | Admitting: Pulmonary Disease

## 2011-07-22 MED ORDER — TIOTROPIUM BROMIDE MONOHYDRATE 18 MCG IN CAPS: 18.0000 ug | ORAL_CAPSULE | Freq: Every day | RESPIRATORY_TRACT | Status: DC

## 2011-08-08 DIAGNOSIS — IMO0002 Reserved for concepts with insufficient information to code with codable children: Secondary | ICD-10-CM | POA: Insufficient documentation

## 2011-11-03 ENCOUNTER — Encounter: Payer: Self-pay | Admitting: Pulmonary Disease

## 2011-11-03 ENCOUNTER — Ambulatory Visit (INDEPENDENT_AMBULATORY_CARE_PROVIDER_SITE_OTHER): Payer: Medicare Other | Admitting: Pulmonary Disease

## 2011-11-03 VITALS — BP 132/66 | HR 77 | Temp 98.3°F | Ht 63.5 in | Wt 155.0 lb

## 2011-11-03 DIAGNOSIS — M353 Polymyalgia rheumatica: Secondary | ICD-10-CM

## 2011-11-03 DIAGNOSIS — J449 Chronic obstructive pulmonary disease, unspecified: Secondary | ICD-10-CM

## 2011-11-03 DIAGNOSIS — J479 Bronchiectasis, uncomplicated: Secondary | ICD-10-CM

## 2011-11-03 NOTE — Patient Instructions (Signed)
Follow up in one year >> call if help needed sooner 

## 2011-11-03 NOTE — Assessment & Plan Note (Signed)
She is maintained on spiriva and prn albuterol.

## 2011-11-03 NOTE — Assessment & Plan Note (Signed)
She has not been using nebulizer therapy.  Will have this discontinued.  She is likely getting some benefit from prednisone for this.  As such, she can forgo inhaled corticosteroid use for now.

## 2011-11-03 NOTE — Progress Notes (Signed)
Chief Complaint  Patient presents with  . Follow-up    Breathing has been okay. has a cough and wheezing occasionally but not very much. pt no longer wants to use the nebulizer medications/machine    History of Present Illness: Jenna SOBH is a 78 y.o. female with COPD, and bronchiectasis.   She has not noticed any benefit from nebulizer therapy.  She has not used this for months, and wants it removed.  She continues to use spiriva.  She uses proair once every few weeks.  She had her prednisone dose decreased to 3 mg daily, and has been doing okay.  She gets occasional cough, especially when talking on the phone.  She denies wheeze, sputum, hemoptysis, or fever.  Past Medical History  Diagnosis Date  . Polymyalgia rheumatica   . Degenerative disk disease   . Osteoarthritis   . Eczema   . Hiatal hernia   . GERD (gastroesophageal reflux disease)   . Gastric ulcer   . Hypothyroidism   . Hypertension   . Hyperlipidemia   . Vertebrobasilar insufficiency   . COPD (chronic obstructive pulmonary disease)   . Bronchiectasis   . Pulmonary nodule, right     Past Surgical History  Procedure Date  . Total abdominal hysterectomy   . Breast cyst excision     right  . Rhinoplasty   . Broken tail bone   . Carotid endarterectomy     right    Outpatient Encounter Prescriptions as of 11/03/2011  Medication Sig Dispense Refill  . albuterol (PROAIR HFA) 108 (90 BASE) MCG/ACT inhaler Inhale 2 puffs into the lungs every 4 (four) hours as needed.        Marland Kitchen amitriptyline (ELAVIL) 50 MG tablet Take 50 mg by mouth at bedtime.        Marland Kitchen aspirin 81 MG tablet Take 81 mg by mouth daily.        Marland Kitchen levothyroxine (SYNTHROID, LEVOTHROID) 75 MCG tablet Take 88 mcg by mouth daily.       . metoprolol (TOPROL-XL) 50 MG 24 hr tablet Take 50 mg by mouth 2 (two) times daily.       . Multiple Vitamin (MULTIVITAMIN) tablet Take 1 tablet by mouth daily.        Marland Kitchen omeprazole (PRILOSEC) 20 MG capsule Take 20 mg by  mouth daily as needed.        . predniSONE (DELTASONE) 1 MG tablet 3 tablets a day      . tiotropium (SPIRIVA) 18 MCG inhalation capsule Place 1 capsule (18 mcg total) into inhaler and inhale daily.  30 capsule  6  . DISCONTD: albuterol (PROVENTIL) (2.5 MG/3ML) 0.083% nebulizer solution Take 3 mLs (2.5 mg total) by nebulization every 4 (four) hours as needed for wheezing or shortness of breath. One vial nebulized twice per day and every 6 hours as needed  60 vial  5  . DISCONTD: budesonide (PULMICORT) 0.25 MG/2ML nebulizer solution Take 2 mLs (0.25 mg total) by nebulization 2 (two) times daily.  120 mL  5    Allergies  Allergen Reactions  . Sulfonamide Derivatives     Physical Exam:  Blood pressure 132/66, pulse 77, temperature 98.3 F (36.8 C), temperature source Oral, height 5' 3.5" (1.613 m), weight 155 lb (70.308 kg), SpO2 97.00%. Body mass index is 27.03 kg/(m^2). Wt Readings from Last 2 Encounters:  11/03/11 155 lb (70.308 kg)  01/09/11 154 lb 6.4 oz (70.035 kg)   General - no distress HEENT - no  sinus tenderness, no oral exudate, no LAN Cardiac - s1s2 regular, no murmur Chest - decreased breath sounds, no wheeze/rales Abd - soft, non tender Ext - no edema Neuro - normal strength   Assessment/Plan:  Jenna Helling, MD [place] [facility]/Critical [facility]/Sleep Pager:  [phone] 11/03/2011, 3:42 PM

## 2011-11-03 NOTE — Assessment & Plan Note (Signed)
She is on prednisone per primary care. 

## 2012-01-05 ENCOUNTER — Telehealth: Payer: Self-pay | Admitting: Pulmonary Disease

## 2012-01-05 DIAGNOSIS — J479 Bronchiectasis, uncomplicated: Secondary | ICD-10-CM

## 2012-01-05 NOTE — Telephone Encounter (Signed)
BRONCHIECTASIS Jenna Helling, MD 11/03/2011 3:42 PM Signed  She has not been using nebulizer therapy. Will have this discontinued. She is likely getting some benefit from prednisone for this. As such, she can forgo inhaled corticosteroid use for now.  I have sent order for this and pt is aware. Nothing further was needed

## 2012-03-31 ENCOUNTER — Other Ambulatory Visit: Payer: Self-pay | Admitting: *Deleted

## 2012-03-31 MED ORDER — TIOTROPIUM BROMIDE MONOHYDRATE 18 MCG IN CAPS: 18.0000 ug | ORAL_CAPSULE | Freq: Every day | RESPIRATORY_TRACT | Status: DC

## 2012-11-12 ENCOUNTER — Encounter: Payer: Self-pay | Admitting: Pulmonary Disease

## 2012-11-12 ENCOUNTER — Ambulatory Visit (INDEPENDENT_AMBULATORY_CARE_PROVIDER_SITE_OTHER)
Admission: RE | Admit: 2012-11-12 | Discharge: 2012-11-12 | Disposition: A | Discharge status: Home / Self Care | Payer: Medicare Other | Source: Ambulatory Visit | Attending: Pulmonary Disease | Admitting: Pulmonary Disease

## 2012-11-12 ENCOUNTER — Ambulatory Visit (INDEPENDENT_AMBULATORY_CARE_PROVIDER_SITE_OTHER): Payer: Medicare Other | Admitting: Pulmonary Disease

## 2012-11-12 VITALS — BP 120/66 | HR 66 | Temp 97.0°F | Ht 63.0 in | Wt 146.0 lb

## 2012-11-12 DIAGNOSIS — J449 Chronic obstructive pulmonary disease, unspecified: Secondary | ICD-10-CM

## 2012-11-12 DIAGNOSIS — M353 Polymyalgia rheumatica: Secondary | ICD-10-CM

## 2012-11-12 DIAGNOSIS — J479 Bronchiectasis, uncomplicated: Secondary | ICD-10-CM

## 2012-11-12 DIAGNOSIS — J4489 Other specified chronic obstructive pulmonary disease: Secondary | ICD-10-CM

## 2012-11-12 DIAGNOSIS — J309 Allergic rhinitis, unspecified: Secondary | ICD-10-CM

## 2012-11-12 MED ORDER — UMECLIDINIUM-VILANTEROL 62.5-25 MCG/INH IN AEPB: 1.0000 | INHALATION_SPRAY | Freq: Every day | RESPIRATORY_TRACT | Status: DC

## 2012-11-12 MED ORDER — MOMETASONE FUROATE 50 MCG/ACT NA SUSP: 2.0000 | Freq: Every day | NASAL | Status: DC

## 2012-11-12 NOTE — Assessment & Plan Note (Signed)
She is on prednisone per primary care. 

## 2012-11-12 NOTE — Assessment & Plan Note (Signed)
Some of her cough could be related to rhinitis and post nasal drip.  Will have her use nasonex daily for 2 weeks, then as needed.

## 2012-11-12 NOTE — Progress Notes (Signed)
Chief Complaint  Patient presents with  . COPD    Breathing has gotten worse. Reports SOB, coughing and wheezing. Denies chest tightness.    History of Present Illness: Jenna Carrillo is a 79 y.o. female COPD, and bronchiectasis with history of polymyalgia rheumatica.   Her breathing has been getting progressively worse.  She has more chest congestion and cough.  She is not bringing up sputum.  She has more allergies and sinus congestion.  She has been getting post-nasal drip.  She is not using anything for her allergies.  She has to clear her throat frequently.  She denies chest pain or fever.  She uses her proair twice per day, and this helps.  She remains on prednisone 3 mg daily. She has not had recent chest xray.  TESTS: PFT 02/02/09 >> FEV1 0.99(53%), FVC 1.58(58%), FEV1% 63, TLC 3.79(79%), DLCO 51%, +BD Spirometry 01/09/11 >> FEV1 0.97 (49%), FEV1% 67   Jenna Carrillo  has a past medical history of Polymyalgia rheumatica; Degenerative disk disease; Osteoarthritis; Eczema; Hiatal hernia; GERD (gastroesophageal reflux disease); Gastric ulcer; Hypothyroidism; Hypertension; Hyperlipidemia; Vertebrobasilar insufficiency; COPD (chronic obstructive pulmonary disease); Bronchiectasis; and Pulmonary nodule, right.  Jenna Carrillo  has past surgical history that includes Total abdominal hysterectomy; Breast cyst excision; Rhinoplasty; broken tail bone; and Carotid endarterectomy.  Prior to Admission medications   Medication Sig Start Date End Date Taking? Authorizing Provider  albuterol (PROAIR HFA) 108 (90 BASE) MCG/ACT inhaler Inhale 2 puffs into the lungs every 4 (four) hours as needed.     Yes Historical Provider, MD  amitriptyline (ELAVIL) 50 MG tablet Take 50 mg by mouth at bedtime.     Yes Historical Provider, MD  aspirin 81 MG tablet Take 81 mg by mouth daily.     Yes Historical Provider, MD  levothyroxine (SYNTHROID, LEVOTHROID) 88 MCG tablet Take 88 mcg by mouth daily before  breakfast.   Yes Historical Provider, MD  metoprolol (TOPROL-XL) 50 MG 24 hr tablet Take 50 mg by mouth 2 (two) times daily.    Yes Historical Provider, MD  Multiple Vitamin (MULTIVITAMIN) tablet Take 1 tablet by mouth daily.     Yes Historical Provider, MD  omeprazole (PRILOSEC) 20 MG capsule Take 20 mg by mouth daily as needed.     Yes Historical Provider, MD  predniSONE (DELTASONE) 1 MG tablet 3 tablets a day   Yes Historical Provider, MD  tiotropium (SPIRIVA) 18 MCG inhalation capsule Place 1 capsule (18 mcg total) into inhaler and inhale daily. 03/31/12  Yes [name], MD    Allergies  Allergen Reactions  . Sulfonamide Derivatives      Physical Exam:  General - No distress ENT - No sinus tenderness, clear nasal discharge, no oral exudate, no LAN Cardiac - s1s2 regular, no murmur Chest - scattered rhonchi, no wheeze Back - No focal tenderness Abd - Soft, non-tender Ext - No edema Neuro - Normal strength Skin - No rashes Psych - normal mood, and behavior   Assessment/Plan:  [name], MD [place] [facility]/Critical Care/Sleep Pager:  [phone]

## 2012-11-12 NOTE — Patient Instructions (Signed)
Nasonex two sprays each nostril daily for two weeks, then as needed Chest xray today >> will call with results Anoro one puff daily >> call in one week to update status with anoro Do not use spiriva while using anoro Follow up in 6 months

## 2012-11-12 NOTE — Assessment & Plan Note (Signed)
Will repeat chest x-ray. 

## 2012-11-12 NOTE — Assessment & Plan Note (Signed)
She has increased cough.  Will try her on anoro in place of spiriva.  She will call back in one week to let us know if anoro is working better.

## 2012-11-16 ENCOUNTER — Telehealth: Payer: Self-pay | Admitting: Pulmonary Disease

## 2012-11-16 NOTE — Telephone Encounter (Signed)
Pt is aware of CXR results. Nothing further was needed. 

## 2012-11-16 NOTE — Telephone Encounter (Signed)
Patient returning call.

## 2012-11-16 NOTE — Telephone Encounter (Signed)
LMTCB

## 2012-11-16 NOTE — Telephone Encounter (Signed)
Dg Chest 2 View  11/12/2012    *RADIOLOGY REPORT*   Clinical Data: Cough, chronic obstructive pulmonary disease.   CHEST - 2 VIEW   Comparison: CT scan of January 02, 2011.   Findings:  Cardiomediastinal silhouette appears normal.  Right upper lobe nodular density is noted which appears to be stable compared with prior CT scan.  Bilateral basilar scarring is noted which is present on prior exam.  No pneumothorax or pleural effusion is noted.  No acute pulmonary disease is noted.   IMPRESSION:  Stable right upper lobe nodule compared to prior CT scan. Bilateral basilar scarring is noted.  No acute cardiopulmonary abnormality seen.    Original Report Authenticated By: Lupita Raider.,  M.D.    Will have my nurse inform pt that CXR looks okay.  She has chronic changes from hx of bronchiectasis.  No change in appearance of Rt upper lobe nodule.  No new findings.  No change to current treatment plan.

## 2012-11-25 ENCOUNTER — Telehealth: Payer: Self-pay | Admitting: Pulmonary Disease

## 2012-11-25 NOTE — Telephone Encounter (Signed)
ATC patient no answer [id number] 

## 2012-11-25 NOTE — Telephone Encounter (Signed)
Okay to send refill for spiriva one puff daily, dispense 30 capsules with 5 refills.

## 2012-11-25 NOTE — Telephone Encounter (Signed)
Spoke with patient, patient tried Anoro and states she does not feel this is any better than the spiriva and she would rather stay with the spiriva. Patient needin rx for spiriva sent to pharmacy-- rx sent Will forward to Dr. Craige Cotta as Lorain Childes

## 2012-11-25 NOTE — Telephone Encounter (Signed)
Returning call can be reached at [phone].Jenna Carrillo

## 2012-11-29 ENCOUNTER — Telehealth: Payer: Self-pay | Admitting: Pulmonary Disease

## 2012-11-29 MED ORDER — TIOTROPIUM BROMIDE MONOHYDRATE 18 MCG IN CAPS: 18.0000 ug | ORAL_CAPSULE | Freq: Every day | RESPIRATORY_TRACT | Status: DC

## 2012-11-29 NOTE — Telephone Encounter (Signed)
Pt aware rx has been sent. Nothing further was needed 

## 2013-05-14 ENCOUNTER — Encounter (HOSPITAL_BASED_OUTPATIENT_CLINIC_OR_DEPARTMENT_OTHER): Payer: Self-pay | Admitting: Emergency Medicine

## 2013-05-14 ENCOUNTER — Emergency Department (HOSPITAL_BASED_OUTPATIENT_CLINIC_OR_DEPARTMENT_OTHER)
Admission: EM | Admit: 2013-05-14 | Discharge: 2013-05-14 | Disposition: A | Discharge status: Home / Self Care | Payer: Medicare Other | Attending: Emergency Medicine | Admitting: Emergency Medicine

## 2013-05-14 DIAGNOSIS — M353 Polymyalgia rheumatica: Secondary | ICD-10-CM | POA: Insufficient documentation

## 2013-05-14 DIAGNOSIS — W268XXA Contact with other sharp object(s), not elsewhere classified, initial encounter: Secondary | ICD-10-CM | POA: Insufficient documentation

## 2013-05-14 DIAGNOSIS — Z7982 Long term (current) use of aspirin: Secondary | ICD-10-CM | POA: Insufficient documentation

## 2013-05-14 DIAGNOSIS — Z87891 Personal history of nicotine dependence: Secondary | ICD-10-CM | POA: Insufficient documentation

## 2013-05-14 DIAGNOSIS — E039 Hypothyroidism, unspecified: Secondary | ICD-10-CM | POA: Insufficient documentation

## 2013-05-14 DIAGNOSIS — S51811A Laceration without foreign body of right forearm, initial encounter: Secondary | ICD-10-CM

## 2013-05-14 DIAGNOSIS — J449 Chronic obstructive pulmonary disease, unspecified: Secondary | ICD-10-CM | POA: Insufficient documentation

## 2013-05-14 DIAGNOSIS — K219 Gastro-esophageal reflux disease without esophagitis: Secondary | ICD-10-CM | POA: Insufficient documentation

## 2013-05-14 DIAGNOSIS — Y9389 Activity, other specified: Secondary | ICD-10-CM | POA: Insufficient documentation

## 2013-05-14 DIAGNOSIS — I10 Essential (primary) hypertension: Secondary | ICD-10-CM | POA: Insufficient documentation

## 2013-05-14 DIAGNOSIS — IMO0002 Reserved for concepts with insufficient information to code with codable children: Secondary | ICD-10-CM | POA: Insufficient documentation

## 2013-05-14 DIAGNOSIS — Z79899 Other long term (current) drug therapy: Secondary | ICD-10-CM | POA: Insufficient documentation

## 2013-05-14 DIAGNOSIS — Y929 Unspecified place or not applicable: Secondary | ICD-10-CM | POA: Insufficient documentation

## 2013-05-14 DIAGNOSIS — M1990 Unspecified osteoarthritis, unspecified site: Secondary | ICD-10-CM | POA: Insufficient documentation

## 2013-05-14 DIAGNOSIS — Z872 Personal history of diseases of the skin and subcutaneous tissue: Secondary | ICD-10-CM | POA: Insufficient documentation

## 2013-05-14 DIAGNOSIS — J4489 Other specified chronic obstructive pulmonary disease: Secondary | ICD-10-CM | POA: Insufficient documentation

## 2013-05-14 DIAGNOSIS — S51809A Unspecified open wound of unspecified forearm, initial encounter: Secondary | ICD-10-CM | POA: Insufficient documentation

## 2013-05-14 DIAGNOSIS — Z8711 Personal history of peptic ulcer disease: Secondary | ICD-10-CM | POA: Insufficient documentation

## 2013-05-14 MED ORDER — LIDOCAINE HCL 2 % IJ SOLN
INTRAMUSCULAR | Status: AC
  Administered 2013-05-14: 13:00:00
  Filled 2013-05-14: qty 20

## 2013-05-14 NOTE — ED Provider Notes (Signed)
CSN: [id number]     Arrival date & time 05/14/13  1029 History   First MD Initiated Contact with Patient 05/14/13 1042     Chief Complaint  Patient presents with  . Laceration   (Consider location/radiation/quality/duration/timing/severity/associated sxs/prior Treatment) HPI Comments: Patient is an 79 year old female with history of PMR on chronic prednisone. She presents today with complaints of a laceration to the right forearm that occurred while reaching into a box and scratching it on a metal staple. Last tetanus was within the last 5 years.  Patient is a 79 y.o. female presenting with skin laceration. The history is provided by the patient.  Laceration Location: Right forearm. Depth:  Through underlying tissue Quality: straight   Bleeding: controlled   Time since incident:  1 hour Laceration mechanism:  Metal edge   Past Medical History  Diagnosis Date  . Polymyalgia rheumatica   . Degenerative disk disease   . Osteoarthritis   . Eczema   . Hiatal hernia   . GERD (gastroesophageal reflux disease)   . Gastric ulcer   . Hypothyroidism   . Hypertension   . Hyperlipidemia   . Vertebrobasilar insufficiency   . COPD (chronic obstructive pulmonary disease)   . Bronchiectasis   . Pulmonary nodule, right    Past Surgical History  Procedure Laterality Date  . Total abdominal hysterectomy    . Breast cyst excision      right  . Rhinoplasty    . Broken tail bone    . Carotid endarterectomy      right   Family History  Problem Relation Age of Onset  . COPD Mother   . Breast cancer Mother   . Liver disease Son   . Emphysema Mother   . Emphysema Father   . Heart attack Father    History  Substance Use Topics  . Smoking status: Former Smoker -- 2.00 packs/day for 2 years    Types: Cigarettes    Quit date: 05/19/1958  . Smokeless tobacco: Not on file  . Alcohol Use: No   OB History   Grav Para Term Preterm Abortions TAB SAB Ect Mult Living                  Review of Systems  All other systems reviewed and are negative.    Allergies  Sulfonamide derivatives  Home Medications   Current Outpatient Rx  Name  Route  Sig  Dispense  Refill  . albuterol (PROAIR HFA) 108 (90 BASE) MCG/ACT inhaler   Inhalation   Inhale 2 puffs into the lungs every 4 (four) hours as needed.           Marland Kitchen amitriptyline (ELAVIL) 50 MG tablet   Oral   Take 50 mg by mouth at bedtime.           Marland Kitchen aspirin 81 MG tablet   Oral   Take 81 mg by mouth daily.           Marland Kitchen levothyroxine (SYNTHROID, LEVOTHROID) 88 MCG tablet   Oral   Take 88 mcg by mouth daily before breakfast.         . metoprolol (TOPROL-XL) 50 MG 24 hr tablet   Oral   Take 50 mg by mouth 2 (two) times daily.          . mometasone (NASONEX) 50 MCG/ACT nasal spray   Nasal   Place 2 sprays into the nose daily.   17 g   11   . Multiple  Vitamin (MULTIVITAMIN) tablet   Oral   Take 1 tablet by mouth daily.           Marland Kitchen omeprazole (PRILOSEC) 20 MG capsule   Oral   Take 20 mg by mouth daily as needed.           . predniSONE (DELTASONE) 1 MG tablet      3 tablets a day         . tiotropium (SPIRIVA) 18 MCG inhalation capsule   Inhalation   Place 1 capsule (18 mcg total) into inhaler and inhale daily.   30 capsule   6   . Umeclidinium-Vilanterol (ANORO ELLIPTA) 62.5-25 MCG/INH AEPB   Inhalation   Inhale 1 puff into the lungs daily.   1 each   5    BP 167/61  Pulse 92  Temp(Src) 97.3 F (36.3 C) (Oral)  Resp 18  Ht 5\' 3"  (1.6 m)  Wt 137 lb (62.143 kg)  BMI 24.27 kg/m2  SpO2 97% Physical Exam  Nursing note and vitals reviewed. Constitutional: She is oriented to person, place, and time. She appears well-developed and well-nourished. No distress.  HENT:  Head: Normocephalic and atraumatic.  Neck: Normal range of motion. Neck supple.  Musculoskeletal:  The right forearm is noted to have a 2.5 cm laceration to the dorsal aspect just distal to the elbow. There is  some fat visible however no tendons or muscles are visible. Distal pulses are intact as are sensation and motor.  Neurological: She is alert and oriented to person, place, and time.  Skin: Skin is warm. She is not diaphoretic.    ED Course  Procedures (including critical care time) Labs Review Labs Reviewed - No data to display Imaging Review No results found.  EKG Interpretation   None      LACERATION REPAIR Performed by: Geoffery Lyons Authorized by: Geoffery Lyons Consent: Verbal consent obtained. Risks and benefits: risks, benefits and alternatives were discussed Consent given by: patient Patient identity confirmed: provided demographic data Prepped and Draped in normal sterile fashion Wound explored  Laceration Location: Right forearm  Laceration Length: 2.5 cm  No Foreign Bodies seen or palpated  Anesthesia: local infiltration  Local anesthetic: lidocaine 1 % without epinephrine  Anesthetic total: 2 ml  Irrigation method: syringe Amount of cleaning: standard  Skin closure: 5-0 Prolene   Number of sutures: 5   Technique: Simple interrupted   Patient tolerance: Patient tolerated the procedure well with no immediate complications.  MDM  No diagnosis found. Local wound care and sutures out in 10 days.    Geoffery Lyons, MD 05/14/13 1106

## 2013-05-14 NOTE — ED Notes (Signed)
Patient cut R forearm in a box staple approx one hour ago, bleeding controlled, Tetanus with the past five years per patient

## 2013-05-24 ENCOUNTER — Ambulatory Visit (INDEPENDENT_AMBULATORY_CARE_PROVIDER_SITE_OTHER): Payer: Medicare Other | Admitting: Pulmonary Disease

## 2013-05-24 ENCOUNTER — Encounter: Payer: Self-pay | Admitting: Pulmonary Disease

## 2013-05-24 VITALS — BP 132/70 | HR 76 | Temp 97.5°F | Ht 63.5 in | Wt 141.0 lb

## 2013-05-24 DIAGNOSIS — J449 Chronic obstructive pulmonary disease, unspecified: Secondary | ICD-10-CM

## 2013-05-24 DIAGNOSIS — J479 Bronchiectasis, uncomplicated: Secondary | ICD-10-CM

## 2013-05-24 NOTE — Progress Notes (Signed)
Chief Complaint  Patient presents with  . COPD    Breathing is unchanged. Reports coughing and wheezing. Denies chest tightness or SOB at this time.    History of Present Illness: Jenna Carrillo is a 80 y.o. female COPD, and bronchiectasis with history of polymyalgia rheumatica.   She remains on predisone 3 mg per day.  She has occasional cough with clear sputum.  She denies fever, wheeze, chest tightness or sinus congestion.  She has developed a rash on her legs and had laceration on her right arm.  TESTS: PFT 02/02/09 >> FEV1 0.99(53%), FVC 1.58(58%), FEV1% 63, TLC 3.79(79%), DLCO 51%, +BD Spirometry 01/09/11 >> FEV1 0.97 (49%), FEV1% 67   Jenna Carrillo  has a past medical history of Polymyalgia rheumatica; Degenerative disk disease; Osteoarthritis; Eczema; Hiatal hernia; GERD (gastroesophageal reflux disease); Gastric ulcer; Hypothyroidism; Hypertension; Hyperlipidemia; Vertebrobasilar insufficiency; COPD (chronic obstructive pulmonary disease); Bronchiectasis; and Pulmonary nodule, right.  Jenna Carrillo  has past surgical history that includes Total abdominal hysterectomy; Breast cyst excision; Rhinoplasty; broken tail bone; and Carotid endarterectomy.  Prior to Admission medications   Medication Sig Start Date End Date Taking? Authorizing Provider  albuterol (PROAIR HFA) 108 (90 BASE) MCG/ACT inhaler Inhale 2 puffs into the lungs every 4 (four) hours as needed.     Yes Historical Provider, MD  amitriptyline (ELAVIL) 50 MG tablet Take 50 mg by mouth at bedtime.     Yes Historical Provider, MD  aspirin 81 MG tablet Take 81 mg by mouth daily.     Yes Historical Provider, MD  levothyroxine (SYNTHROID, LEVOTHROID) 88 MCG tablet Take 88 mcg by mouth daily before breakfast.   Yes Historical Provider, MD  metoprolol (TOPROL-XL) 50 MG 24 hr tablet Take 50 mg by mouth 2 (two) times daily.    Yes Historical Provider, MD  Multiple Vitamin (MULTIVITAMIN) tablet Take 1 tablet by mouth  daily.     Yes Historical Provider, MD  omeprazole (PRILOSEC) 20 MG capsule Take 20 mg by mouth daily as needed.     Yes Historical Provider, MD  predniSONE (DELTASONE) 1 MG tablet 3 tablets a day   Yes Historical Provider, MD  tiotropium (SPIRIVA) 18 MCG inhalation capsule Place 1 capsule (18 mcg total) into inhaler and inhale daily. 03/31/12  Yes [name], MD    Allergies  Allergen Reactions  . Sulfonamide Derivatives      Physical Exam:  General - No distress ENT - No sinus tenderness, no oral exudate, no LAN Cardiac - s1s2 regular, no murmur Chest - scattered rhonchi, no wheeze Back - No focal tenderness Abd - Soft, non-tender Ext - No edema, partially healed laceration Rt forearm Neuro - Normal strength Skin - faint petechial type rash on legs b/l Psych - normal mood, and behavior   Assessment/Plan:  [name], MD [place] [facility]/Critical [facility]/Sleep Pager:  [phone]

## 2013-05-24 NOTE — Assessment & Plan Note (Signed)
She did not tolerate anoro.  She has been using spiriva and this helps.  Unfortunately she was told by her insurance that she would need to change to alternate inhaler >> she will check with her insurance about what options are available for her.

## 2013-05-24 NOTE — Patient Instructions (Signed)
Follow up in 6 months 

## 2013-05-24 NOTE — Assessment & Plan Note (Signed)
Stable

## 2013-09-05 ENCOUNTER — Telehealth: Payer: Self-pay | Admitting: Pulmonary Disease

## 2013-09-05 MED ORDER — TIOTROPIUM BROMIDE MONOHYDRATE 18 MCG IN CAPS: 18.0000 ug | ORAL_CAPSULE | Freq: Every day | RESPIRATORY_TRACT | Status: DC

## 2013-09-05 NOTE — Telephone Encounter (Signed)
Called and spoke with donna, pts daughter and she is aware of refill of the spiriva that has been sent to the pharmacy.

## 2013-12-28 DIAGNOSIS — H3530 Unspecified macular degeneration: Secondary | ICD-10-CM | POA: Insufficient documentation

## 2013-12-28 DIAGNOSIS — H40003 Preglaucoma, unspecified, bilateral: Secondary | ICD-10-CM | POA: Insufficient documentation

## 2014-02-03 ENCOUNTER — Ambulatory Visit (INDEPENDENT_AMBULATORY_CARE_PROVIDER_SITE_OTHER): Payer: Medicare Other | Admitting: Pulmonary Disease

## 2014-02-03 ENCOUNTER — Encounter: Payer: Self-pay | Admitting: Pulmonary Disease

## 2014-02-03 ENCOUNTER — Ambulatory Visit (INDEPENDENT_AMBULATORY_CARE_PROVIDER_SITE_OTHER)
Admission: RE | Admit: 2014-02-03 | Discharge: 2014-02-03 | Disposition: A | Discharge status: Home / Self Care | Payer: Medicare Other | Source: Ambulatory Visit | Attending: Pulmonary Disease | Admitting: Pulmonary Disease

## 2014-02-03 VITALS — BP 122/62 | HR 75 | Ht 63.5 in | Wt 133.4 lb

## 2014-02-03 DIAGNOSIS — R059 Cough, unspecified: Secondary | ICD-10-CM

## 2014-02-03 DIAGNOSIS — J42 Unspecified chronic bronchitis: Secondary | ICD-10-CM

## 2014-02-03 DIAGNOSIS — J309 Allergic rhinitis, unspecified: Secondary | ICD-10-CM

## 2014-02-03 DIAGNOSIS — J449 Chronic obstructive pulmonary disease, unspecified: Secondary | ICD-10-CM

## 2014-02-03 DIAGNOSIS — R05 Cough: Secondary | ICD-10-CM | POA: Insufficient documentation

## 2014-02-03 DIAGNOSIS — J479 Bronchiectasis, uncomplicated: Secondary | ICD-10-CM

## 2014-02-03 DIAGNOSIS — M353 Polymyalgia rheumatica: Secondary | ICD-10-CM

## 2014-02-03 MED ORDER — FLUTICASONE PROPIONATE 50 MCG/ACT NA SUSP: 2.0000 | Freq: Every day | NASAL | Status: DC

## 2014-02-03 NOTE — Assessment & Plan Note (Signed)
Likely related to post-nasal drip.

## 2014-02-03 NOTE — Assessment & Plan Note (Signed)
She is to continue spiriva and prn proair.  I don't think she needs Abx at this time.  Will f/u CXR.

## 2014-02-03 NOTE — Assessment & Plan Note (Signed)
Will have her use nasal irrigation and flonase for one week, then as needed.

## 2014-02-03 NOTE — Progress Notes (Signed)
Chief Complaint  Patient presents with  . Follow-up    Pt c/o cough, wheezing and chest congestion x 1 week with thick mucus that she is unable to expel. Denies SOB, CP/tightness.     History of Present Illness: Jenna Carrillo is a 80 y.o. female COPD, and bronchiectasis with history of polymyalgia rheumatica.   She has notice more trouble with cough for the past week.  She is prone to getting allergies, and her nose has been more weepy.  She is getting glob of phlegm in her throat that she can't cough up, and was to swallow sometimes.  She hears some wheeze.  She denies fever, headache, or chest pain.  She remains on 3 mg prednisone per day.  She has not been using proair much.  TESTS: PFT 02/02/09 >> FEV1 0.99(53%), FVC 1.58(58%), FEV1% 63, TLC 3.79(79%), DLCO 51%, +BD Spirometry 01/09/11 >> FEV1 0.97 (49%), FEV1% 67  PMHx, PSHx, Medications, Allergies, Fhx, Shx reviewed.  Physical Exam:  General - No distress ENT - No sinus tenderness, no oral exudate, no LAN, clear nasal drainage Cardiac - s1s2 regular, no murmur Chest - scattered rhonchi, no wheeze Back - No focal tenderness Abd - Soft, non-tender Ext - No edema, partially healed laceration Rt forearm Neuro - Normal strength Skin - no rashes Psych - normal mood, and behavior   Assessment/Plan:  [name], MD [place] [facility]Critical [facility]/Sleep Pager:  [phone]

## 2014-02-03 NOTE — Assessment & Plan Note (Addendum)
She is on prednisone per primary care.

## 2014-02-03 NOTE — Patient Instructions (Signed)
Chest xray today Nasal irrigation (saline nasal spray) daily for one week, then as needed Flonase two sprays each nostril daily for one week, then as needed Call if not feeling better Follow up in 6 months

## 2014-02-03 NOTE — Assessment & Plan Note (Signed)
F/u CXR.

## 2014-02-07 ENCOUNTER — Telehealth: Payer: Self-pay | Admitting: Pulmonary Disease

## 2014-02-07 NOTE — Telephone Encounter (Signed)
Dg Chest 2 View  02/03/2014   CLINICAL DATA:  Increasing cough.  Chronic bronchitis.  EXAM: CHEST  2 VIEW  COMPARISON:  11/12/2012 and chest CT dated 01/02/2011  FINDINGS: The heart size and pulmonary vascularity are normal. There is scarring in the right upper lobe and chronic atelectasis and scarring in the right middle lobe as well as in the lingula of the left lung. There is a nodular density in the right upper lobe laterally with central calcification. There is also slight calcification and scarring in the right middle lobe. The appearance is unchanged since the prior CT scan.  There are no acute infiltrates or effusions. No acute osseous abnormality.  IMPRESSION: No acute disease.  Stable scarring in both lungs.   Electronically Signed   By: Geanie Cooley M.D.   On: 02/03/2014 15:28    Will have my nurse inform pt that CXR shows chronic changes of COPD.  No evidence for pneumonia.  No change to current tx plan from 02/03/14.

## 2014-02-09 NOTE — Telephone Encounter (Signed)
Results have been explained to patient, pt expressed understanding. Nothing further needed.  

## 2014-05-04 ENCOUNTER — Telehealth: Payer: Self-pay | Admitting: Pulmonary Disease

## 2014-05-04 MED ORDER — TIOTROPIUM BROMIDE MONOHYDRATE 18 MCG IN CAPS: 18.0000 ug | ORAL_CAPSULE | Freq: Every day | RESPIRATORY_TRACT | Status: DC

## 2014-05-04 NOTE — Telephone Encounter (Signed)
Pt's daughter requesting spiriva refill for pt.  This has been sent in.  Nothing further needed.

## 2014-10-13 ENCOUNTER — Encounter: Payer: Self-pay | Admitting: Pulmonary Disease

## 2014-10-13 ENCOUNTER — Ambulatory Visit (INDEPENDENT_AMBULATORY_CARE_PROVIDER_SITE_OTHER): Payer: Medicare Other | Admitting: Pulmonary Disease

## 2014-10-13 VITALS — BP 132/74 | HR 79 | Ht 63.5 in | Wt 138.6 lb

## 2014-10-13 DIAGNOSIS — J479 Bronchiectasis, uncomplicated: Secondary | ICD-10-CM | POA: Diagnosis not present

## 2014-10-13 DIAGNOSIS — J42 Unspecified chronic bronchitis: Secondary | ICD-10-CM | POA: Diagnosis not present

## 2014-10-13 DIAGNOSIS — M353 Polymyalgia rheumatica: Secondary | ICD-10-CM

## 2014-10-13 MED ORDER — FLUTICASONE FUROATE-VILANTEROL 100-25 MCG/INH IN AEPB: 1.0000 | INHALATION_SPRAY | Freq: Every day | RESPIRATORY_TRACT | Status: DC

## 2014-10-13 NOTE — Patient Instructions (Signed)
Breo one puff daily >> rinse mouth after each use Stop spiriva while using Breo Albuterol every 6 hours as needed for cough, wheeze, or chest congestion Call in 2 weeks to update status Follow up in 6 months

## 2014-10-13 NOTE — Progress Notes (Signed)
Chief Complaint  Patient presents with  . Follow-up    Pt c/o chest congestion, SOB and cough with brown mucus production at times x 2 months. Pt would like to discuss alternative for Spiriva - when she hits donut hole it costs her $300 monthly.    History of Present Illness: Jenna Carrillo is a 81 y.o. female COPD, and bronchiectasis with history of polymyalgia rheumatica.   She has noticed more cough and sputum.  She denies wheeze, fever, or hemoptysis.  She feels that spiriva helps, but she is concerned about expense.  She has been using albuterol and this helps.  TESTS: PFT 02/02/09 >> FEV1 0.99(53%), FVC 1.58(58%), FEV1% 63, TLC 3.79(79%), DLCO 51%, +BD Spirometry 01/09/11 >> FEV1 0.97 (49%), FEV1% 67  PMHx >> HH, GERD, PUD, Hypothyroidism, HTN, HLD  PSHx, Medications, Allergies, Fhx, Shx reviewed.  Physical Exam: Blood pressure 132/74, pulse 79, height 5' 3.5" (1.613 m), weight 138 lb 9.6 oz (62.869 kg), SpO2 96 %. Body mass index is 24.16 kg/(m^2).  General - No distress ENT - No sinus tenderness, no oral exudate, no LAN, clear nasal drainage Cardiac - s1s2 regular, no murmur Chest - no wheeze Back - No focal tenderness Abd - Soft, non-tender Ext - No edema Neuro - Normal strength Skin - no rashes Psych - normal mood, and behavior   Assessment/Plan:  COPD with Bronchiectasis. Plan: - will try changing her from spiriva to breo - she can continue albuterol as needed  Polymyalgia rheumatica. Plan: - she has f/u with PCP in June to discuss plans for prednisone   [name], MD [place] [facility]/Critical Care/Sleep Pager:  [phone]

## 2015-02-09 ENCOUNTER — Telehealth: Payer: Self-pay | Admitting: Pulmonary Disease

## 2015-02-09 MED ORDER — TIOTROPIUM BROMIDE MONOHYDRATE 18 MCG IN CAPS: 18.0000 ug | ORAL_CAPSULE | Freq: Every day | RESPIRATORY_TRACT | Status: DC

## 2015-02-09 NOTE — Telephone Encounter (Signed)
Pt returned call [phone]

## 2015-02-09 NOTE — Telephone Encounter (Signed)
Spoke with pt. She was told at last OV to stop spiriva and take breo. She doesn't feel breo works as well as spiriva and wants to go back to this. Please advise Dr. Craige Cotta thanks

## 2015-02-09 NOTE — Telephone Encounter (Signed)
Please call Audry Riles (daughter) [phone] once we complete this.

## 2015-02-09 NOTE — Telephone Encounter (Signed)
lmtcb X1 for pt's daughter to relay results/recs.

## 2015-02-09 NOTE — Telephone Encounter (Signed)
Okay to send order for spiriva one puff daily, and stop breo.

## 2015-02-09 NOTE — Telephone Encounter (Signed)
Spoke with daughter and is aware of below. spiriva sent in. Nothing further needed

## 2015-02-09 NOTE — Telephone Encounter (Signed)
lmomtcb x1 

## 2015-02-13 ENCOUNTER — Other Ambulatory Visit: Payer: Self-pay | Admitting: Pulmonary Disease

## 2015-02-13 MED ORDER — TIOTROPIUM BROMIDE MONOHYDRATE 18 MCG IN CAPS: 18.0000 ug | ORAL_CAPSULE | Freq: Every day | RESPIRATORY_TRACT | Status: DC

## 2015-02-13 NOTE — Telephone Encounter (Signed)
Received paper refill request for Spiriva 18 MCG CP - HANDIHALER Refill  30 Capsules x5 approved per VS Sent RX to [facility]  Nothing further needed

## 2015-07-12 ENCOUNTER — Ambulatory Visit (INDEPENDENT_AMBULATORY_CARE_PROVIDER_SITE_OTHER): Payer: Medicare Other | Admitting: Pulmonary Disease

## 2015-07-12 ENCOUNTER — Encounter: Payer: Self-pay | Admitting: Pulmonary Disease

## 2015-07-12 VITALS — BP 128/72 | HR 93 | Ht 63.5 in | Wt 143.4 lb

## 2015-07-12 DIAGNOSIS — M353 Polymyalgia rheumatica: Secondary | ICD-10-CM | POA: Diagnosis not present

## 2015-07-12 DIAGNOSIS — J479 Bronchiectasis, uncomplicated: Secondary | ICD-10-CM | POA: Diagnosis not present

## 2015-07-12 DIAGNOSIS — J42 Unspecified chronic bronchitis: Secondary | ICD-10-CM

## 2015-07-12 NOTE — Patient Instructions (Signed)
Follow up in 1 year.

## 2015-07-12 NOTE — Progress Notes (Signed)
Current Outpatient Prescriptions on File Prior to Visit  Medication Sig  . albuterol (PROAIR HFA) 108 (90 BASE) MCG/ACT inhaler Inhale 2 puffs into the lungs every 4 (four) hours as needed.    Marland Kitchen amitriptyline (ELAVIL) 50 MG tablet Take 50 mg by mouth at bedtime.  Marland Kitchen levothyroxine (SYNTHROID, LEVOTHROID) 88 MCG tablet Take 88 mcg by mouth daily before breakfast.  . metoprolol (TOPROL-XL) 50 MG 24 hr tablet Take 50 mg by mouth 2 (two) times daily.   . Multiple Vitamin (MULTIVITAMIN) tablet Take 1 tablet by mouth daily.    Marland Kitchen omeprazole (PRILOSEC) 20 MG capsule Take 20 mg by mouth daily as needed.    . predniSONE (DELTASONE) 1 MG tablet 3 tablets a day  . tiotropium (SPIRIVA) 18 MCG inhalation capsule Place 1 capsule (18 mcg total) into inhaler and inhale daily.   No current facility-administered medications on file prior to visit.    Chief Complaint  Patient presents with  . Follow-up    pt. states breathing is baseline. occ. SOB. dry cough x5wk. occ. wheezing. occ. chest pain/tightness    Past medical history HH, GERD, PUD, Hypothyroidism, HTN, HLD  PSHx, Medications, Allergies, Fhx, Shx reviewed.  Tests PFT 02/02/09 >> FEV1 0.99(53%), FVC 1.58(58%), FEV1% 63, TLC 3.79(79%), DLCO 51%, +BD Spirometry 01/09/11 >> FEV1 0.97 (49%), FEV1% 67  Vital signs BP 128/72 mmHg  Pulse 93  Ht 5' 3.5" (1.613 m)  Wt 143 lb 6.4 oz (65.046 kg)  BMI 25.00 kg/m2  SpO2 96%  History of Present Illness: Jenna Carrillo is a 82 y.o. female COPD, and bronchiectasis with history of polymyalgia rheumatica.   Her breathing has been stable.  She gets occasional cough and clear sputum.  She denies wheeze, chest pain, fever, or hemoptysis.  She uses spiriva daily.  She does not use albuterol much.  She remains on 3 mg prednisone daily.  She fell recently and bruised her Lt arm.  Physical Exam:  General - No distress ENT - No sinus tenderness, no oral exudate, no LAN, clear nasal drainage Cardiac - s1s2  regular, no murmur Chest - no wheeze Back - No focal tenderness Abd - Soft, non-tender Ext - No edema Neuro - Normal strength Skin - ecchymosis Lt upper arm Psych - normal mood, and behavior   Assessment/Plan:  COPD with Bronchiectasis. Plan: - continue spiriva and prn albuterol - completed handicap parking form for her  Polymyalgia rheumatica. Plan: - she continues on 3 mg prednisone daily per her PCP   Patient Instructions  Follow up in 1 year    [name], MD July 12, 2015 3:53 PM [place] [facility]Critical [facility]/Sleep Pager:  [phone]

## 2015-10-12 ENCOUNTER — Telehealth: Payer: Self-pay | Admitting: Pulmonary Disease

## 2015-10-12 ENCOUNTER — Ambulatory Visit (INDEPENDENT_AMBULATORY_CARE_PROVIDER_SITE_OTHER)
Admission: RE | Admit: 2015-10-12 | Discharge: 2015-10-12 | Disposition: A | Discharge status: Home / Self Care | Payer: Medicare Other | Source: Ambulatory Visit | Attending: Pulmonary Disease | Admitting: Pulmonary Disease

## 2015-10-12 ENCOUNTER — Other Ambulatory Visit: Payer: Self-pay | Admitting: Pulmonary Disease

## 2015-10-12 ENCOUNTER — Encounter: Payer: Self-pay | Admitting: Pulmonary Disease

## 2015-10-12 ENCOUNTER — Ambulatory Visit (INDEPENDENT_AMBULATORY_CARE_PROVIDER_SITE_OTHER): Payer: Medicare Other | Admitting: Pulmonary Disease

## 2015-10-12 VITALS — BP 150/60 | HR 101 | Temp 98.0°F | Ht 63.5 in | Wt 133.4 lb

## 2015-10-12 DIAGNOSIS — J479 Bronchiectasis, uncomplicated: Secondary | ICD-10-CM | POA: Diagnosis not present

## 2015-10-12 DIAGNOSIS — R918 Other nonspecific abnormal finding of lung field: Secondary | ICD-10-CM

## 2015-10-12 MED ORDER — LEVOFLOXACIN 500 MG PO TABS: 500.0000 mg | ORAL_TABLET | Freq: Every day | ORAL | Status: DC

## 2015-10-12 NOTE — Telephone Encounter (Signed)
Results and recommendations given to pt. Rx already sent. Appt scheduled. Nothing further needed.

## 2015-10-12 NOTE — Assessment & Plan Note (Signed)
Chest x-ray today- showed right middle lobe infiltrate Levaquin daily for 7 days Follow-up imaging after month, sooner if no improvement Delsym cough syrup 5 ML twice daily as needed for cough  Call us if no better by next week

## 2015-10-12 NOTE — Progress Notes (Signed)
Notes Recorded by Oretha Milch, MD on 10/12/2015 at 12:50 PM X-ray shows right middle lobe pneumonia Send prescription for- levaquin 500 mg x 7 days  FU dr Craige Cotta in 1 month

## 2015-10-12 NOTE — Patient Instructions (Signed)
Chest x-ray today Based on this we will call you an antibiotic Delsym cough syrup 5 ML twice daily as needed for cough  Call us if no better by next week

## 2015-10-12 NOTE — Progress Notes (Signed)
   Subjective:    Patient ID: EMMALEAH MERONEY, female    DOB: 1931-04-19, 82 y.o.   MRN: [id number]  HPI 82 y.o. female , non smoker, COPD, and bronchiectasis with history of polymyalgia rheumatica.    10/12/2015  Chief Complaint  Patient presents with  . Acute Visit    sick with cold symptoms x 3 weeks, cares for her son who is bedridden, very fatigued, weak.  chest congestion, non-productive cough.     She complains of subjective fevers, dry cough and excessive fatigue for the last 3 weeks. She takes care of her 56 year old son with multiple sclerosis was bed bound and would like to start feeling better soon. He reports a 10 pound weight loss over the last 3 months   Tests PFT 02/02/09 >> FEV1 0.99(53%), FVC 1.58(58%), FEV1% 63, TLC 3.79(79%), DLCO 51%, +BD Spirometry 01/09/11 >> FEV1 0.97 (49%), FEV1% 67  CT chest 12/2010 11 mm spiculated opacity in the right upper lobe., 76mm smaller nodules in RLL, bronchiectasis in the right middle lobe and lingula,  Review of Systems Patient denies significant dyspnea,cough, hemoptysis,  chest pain, palpitations, pedal edema, orthopnea, paroxysmal nocturnal dyspnea, lightheadedness, nausea, vomiting, abdominal or  leg pains      Objective:   Physical Exam   Gen. Pleasant, well-nourished, in no distress ENT - no lesions, no post nasal drip Neck: No JVD, no thyromegaly, no carotid bruits Lungs: no use of accessory muscles, no dullness to percussion, rt axilla rales, no rhonchi  Cardiovascular: Rhythm regular, heart sounds  normal, no murmurs or gallops, no peripheral edema Musculoskeletal: No deformities, no cyanosis or clubbing        Assessment & Plan:

## 2015-10-12 NOTE — Assessment & Plan Note (Signed)
-  Appears to be granulomas -Unchanged on chest x-ray Consider repeat CT after current infiltrate

## 2015-11-12 ENCOUNTER — Encounter: Payer: Self-pay | Admitting: Acute Care

## 2015-11-12 ENCOUNTER — Telehealth: Payer: Self-pay | Admitting: Acute Care

## 2015-11-12 ENCOUNTER — Ambulatory Visit (INDEPENDENT_AMBULATORY_CARE_PROVIDER_SITE_OTHER): Payer: Medicare Other | Admitting: Acute Care

## 2015-11-12 ENCOUNTER — Other Ambulatory Visit: Payer: Self-pay | Admitting: Acute Care

## 2015-11-12 ENCOUNTER — Ambulatory Visit (INDEPENDENT_AMBULATORY_CARE_PROVIDER_SITE_OTHER)
Admission: RE | Admit: 2015-11-12 | Discharge: 2015-11-12 | Disposition: A | Discharge status: Home / Self Care | Payer: Medicare Other | Source: Ambulatory Visit | Attending: Acute Care | Admitting: Acute Care

## 2015-11-12 VITALS — BP 120/68 | HR 71 | Temp 97.5°F | Ht 63.0 in | Wt 129.0 lb

## 2015-11-12 DIAGNOSIS — R918 Other nonspecific abnormal finding of lung field: Secondary | ICD-10-CM | POA: Diagnosis not present

## 2015-11-12 DIAGNOSIS — J189 Pneumonia, unspecified organism: Secondary | ICD-10-CM

## 2015-11-12 MED ORDER — AMOXICILLIN-POT CLAVULANATE 875-125 MG PO TABS: 1.0000 | ORAL_TABLET | Freq: Two times a day (BID) | ORAL | Status: DC

## 2015-11-12 MED ORDER — LEVOFLOXACIN 500 MG PO TABS: 500.0000 mg | ORAL_TABLET | Freq: Every day | ORAL | Status: AC

## 2015-11-12 NOTE — Assessment & Plan Note (Signed)
Persistent patchy alveolar Right sided opacity despite 1 week of Levaquin per CXR 6/26 Plan: Levaquin 500 mg once daily x 7 additional days. ( This has been called in to pt.pharmacy , as CXR did not result until after patient had left.) We will repeat a CT chest to evaluate the pulmonary nodules. Come in and see Dr. Craige Cotta in August as is already scheduled. Continue your Spiriva daily Continue your Albuterol as needed for shortness of breath or wheezing. Please contact office for sooner follow up if symptoms do not improve or worsen or seek emergency care

## 2015-11-12 NOTE — Patient Instructions (Addendum)
It is nice to meet you today. We will do a chest xray today to make sure your pneumonia is gone. We will call you with results. We will repeat a CT chest to evaluate the pulmonary nodules. Come in and see Dr. Craige Cotta in August as is already scheduled. Continue your Spiriva daily Continue your Albuterol as needed for shortness of breath or wheezing. Please contact office for sooner follow up if symptoms do not improve or worsen or seek emergency care

## 2015-11-12 NOTE — Progress Notes (Signed)
History of Present Illness Jenna Carrillo is a 82 y.o. female with COPD followed by Dr. Craige Cotta.   11/12/2015 Follow Up Pneumonia: Pt. Presents to the office today for follow up of right middle lobe infiltrate. She states she feels better, denies fever, chest pain, cough, or increase in sputum.She completed her Levaquin. She does not have purulent secretions. She denies orthopnea or hemoptysis.She has noted she has had some weight loss. We discussed repeating a CT chest to evaluate the pulmonary nodules that were noted on a 2012 CT chest. She would like to do a follow up scan.Follow Up CXR done today indicates  Persistent patchy alveolar opacities in the right infrahilar region and in the lingula.  Tests  CXR 11/12/2015 ( Evaluated personally by me)  1. COPD. Persistent patchy alveolar opacities in the right infrahilar region and in the lingula. Additional follow-up chest x-ray in 2-3 weeks following any additional antibiotic therapy is needed. If the chest has not cleared at this time, chest CT scanning will be needed. 2. Aortic atherosclerosis.  PFT 02/02/09 >> FEV1 0.99(53%), FVC 1.58(58%), FEV1% 63, TLC 3.79(79%), DLCO 51%, +BD Spirometry 01/09/11 >> FEV1 0.97 (49%), FEV1% 67  CT chest 12/2010 11 mm spiculated opacity in the right upper lobe., 59mm smaller nodules in RLL, bronchiectasis in the right middle lobe and lingula  Past medical hx Past Medical History  Diagnosis Date  . Polymyalgia rheumatica (HCC)   . Degenerative disk disease   . Osteoarthritis   . Eczema   . Hiatal hernia   . GERD (gastroesophageal reflux disease)   . Gastric ulcer   . Hypothyroidism   . Hypertension   . Hyperlipidemia   . Vertebrobasilar insufficiency   . COPD (chronic obstructive pulmonary disease) (HCC)   . Bronchiectasis   . Pulmonary nodule, right      Past surgical hx, Family hx, Social hx all reviewed.  Current Outpatient Prescriptions on File Prior to Visit  Medication Sig  .  albuterol (PROAIR HFA) 108 (90 BASE) MCG/ACT inhaler Inhale 2 puffs into the lungs every 4 (four) hours as needed.    Marland Kitchen amitriptyline (ELAVIL) 50 MG tablet Take 50 mg by mouth at bedtime.  Marland Kitchen levothyroxine (SYNTHROID, LEVOTHROID) 88 MCG tablet Take 88 mcg by mouth daily before breakfast.  . metoprolol (TOPROL-XL) 50 MG 24 hr tablet Take 50 mg by mouth 2 (two) times daily.   . Multiple Vitamin (MULTIVITAMIN) tablet Take 1 tablet by mouth daily.    Marland Kitchen omeprazole (PRILOSEC) 20 MG capsule Take 20 mg by mouth daily as needed.    . predniSONE (DELTASONE) 1 MG tablet 3 tablets a day  . tiotropium (SPIRIVA) 18 MCG inhalation capsule Place 1 capsule (18 mcg total) into inhaler and inhale daily.   No current facility-administered medications on file prior to visit.     Allergies  Allergen Reactions  . Sulfonamide Derivatives     Review Of Systems:  Constitutional:   No  weight loss, night sweats,  Fevers, chills, fatigue, or  lassitude.  HEENT:   No headaches,  Difficulty swallowing,  Tooth/dental problems, or  Sore throat,                No sneezing, itching, ear ache, nasal congestion, post nasal drip,   CV:  No chest pain,  Orthopnea, PND, swelling in lower extremities, anasarca, dizziness, palpitations, syncope.   GI  No heartburn, indigestion, abdominal pain, nausea, vomiting, diarrhea, change in bowel habits, loss of appetite, bloody stools.  Resp: No shortness of breath with exertion or at rest.  No excess mucus, no productive cough,  No non-productive cough,  No coughing up of blood.  No change in color of mucus.  No wheezing.  No chest wall deformity  Skin: no rash or lesions.  GU: no dysuria, change in color of urine, no urgency or frequency.  No flank pain, no hematuria   MS:  No joint pain or swelling.  No decreased range of motion.  No back pain.  Psych:  No change in mood or affect. No depression or anxiety.  No memory loss.   Vital Signs BP 120/68 mmHg  Pulse 71   Temp(Src) 97.5 F (36.4 C) (Oral)  Ht 5\' 3"  (1.6 m)  Wt 129 lb (58.514 kg)  BMI 22.86 kg/m2  SpO2 98%   Physical Exam:  General- No distress,  A&Ox3 ENT: No sinus tenderness, TM clear, pale nasal mucosa, no oral exudate,no post nasal drip, no LAN Cardiac: S1, S2, regular rate and rhythm, no murmur Chest: No wheeze/ rales/ dullness; no accessory muscle use, no nasal flaring, no sternal retractions Abd.: Soft Non-tender Ext: No clubbing cyanosis, edema Neuro:  normal strength Skin: No rashes, warm and dry Psych: normal mood and behavior   Assessment/Plan  CAP (community acquired pneumonia) Persistent patchy alveolar Right sided opacity despite 1 week of Levaquin per CXR 6/26 Plan: Levaquin 500 mg once daily x 7 additional days. ( This has been called in to pt.pharmacy , as CXR did not result until after patient had left.) We will repeat a CT chest to evaluate the pulmonary nodules. Come in and see Dr. 7/26 in August as is already scheduled. Continue your Spiriva daily Continue your Albuterol as needed for shortness of breath or wheezing. Please contact office for sooner follow up if symptoms do not improve or worsen or seek emergency care    Pulmonary nodules Pulmonary nodules  Plan: We will repeat a CT chest to evaluate the pulmonary nodules. Come in and see Dr. September in August as is already scheduled.      September, NP 11/12/2015  6:15 PM

## 2015-11-12 NOTE — Telephone Encounter (Signed)
I tried to call Jenna Carrillo tonight to let her know her CXR was not clear, still indicates pneumonia..I have called in an additional week of Levaquin. She will need a follow up CXR in 3 weeks.There was no answer. I will have one of the nurses call the patient in the morning. I have left a message requesting the patient call the office in the morning to ensure she gets communicated with regarding these findings.

## 2015-11-12 NOTE — Assessment & Plan Note (Signed)
Pulmonary nodules  Plan: We will repeat a CT chest to evaluate the pulmonary nodules. Come in and see Dr. Craige Cotta in August as is already scheduled.

## 2015-11-13 NOTE — Telephone Encounter (Signed)
Called spoke with pt. Reviewed SG's recs. Scheduled pt for ov with SG on 12/05/15. Pt voiced understanding and had no further questions.

## 2015-11-13 NOTE — Telephone Encounter (Signed)
Patient returned call - She can be reached at [phone]-prm

## 2015-11-13 NOTE — Telephone Encounter (Signed)
LVM for pt to return call

## 2015-11-16 NOTE — Progress Notes (Signed)
Reviewed and agree with assessment/plan.  [name], MD [facility] Pulmonary/Critical Care 11/16/2015, 8:57 PM Pager:  [phone]

## 2015-11-27 ENCOUNTER — Ambulatory Visit (INDEPENDENT_AMBULATORY_CARE_PROVIDER_SITE_OTHER)
Admission: RE | Admit: 2015-11-27 | Discharge: 2015-11-27 | Disposition: A | Discharge status: Home / Self Care | Payer: Medicare Other | Source: Ambulatory Visit | Attending: Acute Care | Admitting: Acute Care

## 2015-11-27 DIAGNOSIS — R918 Other nonspecific abnormal finding of lung field: Secondary | ICD-10-CM | POA: Diagnosis not present

## 2015-12-05 ENCOUNTER — Telehealth: Payer: Self-pay | Admitting: Acute Care

## 2015-12-05 ENCOUNTER — Ambulatory Visit (INDEPENDENT_AMBULATORY_CARE_PROVIDER_SITE_OTHER): Payer: Medicare Other | Admitting: Acute Care

## 2015-12-05 ENCOUNTER — Encounter: Payer: Self-pay | Admitting: Acute Care

## 2015-12-05 ENCOUNTER — Ambulatory Visit (INDEPENDENT_AMBULATORY_CARE_PROVIDER_SITE_OTHER)
Admission: RE | Admit: 2015-12-05 | Discharge: 2015-12-05 | Disposition: A | Discharge status: Home / Self Care | Payer: Medicare Other | Source: Ambulatory Visit | Attending: Acute Care | Admitting: Acute Care

## 2015-12-05 ENCOUNTER — Other Ambulatory Visit: Payer: Self-pay | Admitting: Acute Care

## 2015-12-05 VITALS — BP 110/58 | HR 64 | Ht 63.0 in | Wt 130.0 lb

## 2015-12-05 DIAGNOSIS — J189 Pneumonia, unspecified organism: Secondary | ICD-10-CM

## 2015-12-05 DIAGNOSIS — J479 Bronchiectasis, uncomplicated: Secondary | ICD-10-CM | POA: Diagnosis not present

## 2015-12-05 DIAGNOSIS — R0600 Dyspnea, unspecified: Secondary | ICD-10-CM

## 2015-12-05 NOTE — Assessment & Plan Note (Addendum)
Mild progression per CT 10/2015 Currently clinically improved  after 2 rounds of Levaquin. No fever, purulent secretions. Plan: CXR today  Sputum Culture Follow up with Dr. Craige Cotta 01/14/2016

## 2015-12-05 NOTE — Progress Notes (Addendum)
History of Present Illness Jenna Carrillo is a 82 y.o. female former smoker ( Quit [phone]) with COPD,, and stable pulmonary nodules followed by Dr. Craige Cotta.  Past medical history HH, GERD, PUD, Hypothyroidism, HTN, HLD  7/19/2017Follow Up Pneumonia: Pt. Presents to the office today for follow up pneumonia. She states that she completed her Levaquin prescribed at the last visit for unresolved pneumonia. She denies fever, chest pain, cough is rare and productive with clear to white secretions.no orthopnea or hemoptysis. She states she does have fatigue as she is caring for her adult son who has MS. She is compliant with her Spiriva daily and uses her Albuterol approximately twice weekly. She is very active.  Tests 12/05/2015: CXR: Right middle lobe atelectasis and/or infiltrate. A component of bronchiectasis may be present. 2. Bilateral pulmonary nodules. 11/27/2015: CT W/O Contrast  IMPRESSION: 1. Chest CT findings of chronic infectious bronchiolitis due to atypical mycobacterial infection (MAI) including patchy areas of bronchiectasis, tree-in-bud opacities, bandlike scarring and centrilobular nodularity in both lungs, with mild progression since 2012, particularly in the right upper and right lower lobes. 2. No thoracic adenopathy. 3. Aortic atherosclerosis. Left main and 1 vessel coronary atherosclerosis.  PFT 02/02/09 >> FEV1 0.99(53%), FVC 1.58(58%), FEV1% 63, TLC 3.79(79%), DLCO 51%, +BD Spirometry 01/09/11 >> FEV1 0.97 (49%), FEV1% 67  Past medical hx Past Medical History  Diagnosis Date  . Polymyalgia rheumatica (HCC)   . Degenerative disk disease   . Osteoarthritis   . Eczema   . Hiatal hernia   . GERD (gastroesophageal reflux disease)   . Gastric ulcer   . Hypothyroidism   . Hypertension   . Hyperlipidemia   . Vertebrobasilar insufficiency   . COPD (chronic obstructive pulmonary disease) (HCC)   . Bronchiectasis   . Pulmonary nodule, right      Past surgical  hx, Family hx, Social hx all reviewed.  Current Outpatient Prescriptions on File Prior to Visit  Medication Sig  . albuterol (PROAIR HFA) 108 (90 BASE) MCG/ACT inhaler Inhale 2 puffs into the lungs every 4 (four) hours as needed.    Marland Kitchen amitriptyline (ELAVIL) 50 MG tablet Take 50 mg by mouth at bedtime.  Marland Kitchen levothyroxine (SYNTHROID, LEVOTHROID) 88 MCG tablet Take 88 mcg by mouth daily before breakfast.  . metoprolol (TOPROL-XL) 50 MG 24 hr tablet Take 50 mg by mouth 2 (two) times daily.   . Multiple Vitamin (MULTIVITAMIN) tablet Take 1 tablet by mouth daily.    Marland Kitchen omeprazole (PRILOSEC) 20 MG capsule Take 20 mg by mouth daily as needed.    . predniSONE (DELTASONE) 1 MG tablet 3 tablets a day  . tiotropium (SPIRIVA) 18 MCG inhalation capsule Place 1 capsule (18 mcg total) into inhaler and inhale daily.   No current facility-administered medications on file prior to visit.     Allergies  Allergen Reactions  . Sulfonamide Derivatives     Review Of Systems:  Constitutional:   No  weight loss, night sweats,  Fevers, chills, fatigue, or  lassitude.  HEENT:   No headaches,  Difficulty swallowing,  Tooth/dental problems, or  Sore throat,                No sneezing, itching, ear ache, nasal congestion, post nasal drip,   CV:  No chest pain,  Orthopnea, PND, swelling in lower extremities, anasarca, dizziness, palpitations, syncope.   GI  No heartburn, indigestion, abdominal pain, nausea, vomiting, diarrhea, change in bowel habits, loss of appetite, bloody stools.   Resp:  No shortness of breath with exertion or at rest.  No excess mucus, + productive cough ( white secretions) ,  No non-productive cough,  No coughing up of blood.  No change in color of mucus.  No wheezing.  No chest wall deformity  Skin: no rash or lesions.  GU: no dysuria, change in color of urine, no urgency or frequency.  No flank pain, no hematuria   MS:  No joint pain or swelling.  No decreased range of motion.  No back  pain.  Psych:  No change in mood or affect. No depression or anxiety.  No memory loss.   Vital Signs BP 110/58 mmHg  Pulse 64  Ht 5\' 3"  (1.6 m)  Wt 130 lb (58.968 kg)  BMI 23.03 kg/m2  SpO2 93%   Physical Exam:  General- No distress,  A&Ox3, quiet ENT: No sinus tenderness, TM clear, pale nasal mucosa, no oral exudate,no post nasal drip, no LAN Cardiac: S1, S2, regular rate and rhythm, no murmur Chest: No wheeze/ rales/ dullness; no accessory muscle use, no nasal flaring, no sternal retractions Abd.: Soft Non-tender Ext: No clubbing cyanosis, edema Neuro:  normal strength Skin: No rashes, warm and dry Psych: normal mood and behavior   Assessment/Plan  CAP (community acquired pneumonia) Follow up for pneumonia: CXR done this am Plan: We will call you with your CXR results. The pulmonary nodules in your lungs appear stable based on the CT done 10/2015. Continue your Spiriva daily. Remember to rinse your mouth after use. Use your Albuterol as needed for shortness of breath or wheezing. Rest when you can.  Follow up with Dr. 11/2015 in August as is already scheduled.   COPD COPD well controlled on current medication regimen Plan: Continue Spiriva daily Continue Albuterol As needed Follow up with Dr. September in August as scheduled. Please contact office for sooner follow up if symptoms do not improve or worsen or seek emergency care   BRONCHIECTASIS Mild progression per CT 10/2015 Currently clinically improved  after 2 rounds of Levaquin. No fever, purulent secretions. Plan: CXR today  Sputum Culture Follow up with Dr. 11/2015 01/14/2016     01/16/2016, NP 12/05/2015  4:19 PM

## 2015-12-05 NOTE — Assessment & Plan Note (Signed)
COPD well controlled on current medication regimen Plan: Continue Spiriva daily Continue Albuterol As needed Follow up with Dr. Craige Cotta in August as scheduled. Please contact office for sooner follow up if symptoms do not improve or worsen or seek emergency care

## 2015-12-05 NOTE — Progress Notes (Signed)
Reviewed and agree with assessment/plan.  [name], MD [facility] Pulmonary/Critical Care 12/05/2015, 12:12 PM Pager:  [phone]

## 2015-12-05 NOTE — Patient Instructions (Addendum)
It is good to see you again today. I am glad you are feeling better. We will call you with your CXR results. The pulmonary nodules in your lungs appear stable based on the CT done 10/2015. Continue your Spiriva daily. Remember to rinse your mouth after use. Use your Albuterol as needed for shortness of breath or wheezing. Rest when you can.  Follow up with Dr. Craige Cotta in August as is already scheduled. Please contact office for sooner follow up if symptoms do not improve or worsen or seek emergency care

## 2015-12-05 NOTE — Telephone Encounter (Signed)
Spoke with pt and advised of results and recommendations. She will come for sputum culture in next few days. Orders placed. Nothing further needed.

## 2015-12-05 NOTE — Telephone Encounter (Signed)
Please call Ms. Barefield and let her know that her CXR indicates that there is not complete resolution of her pneumonia/ infiltrate/ pneumonia. Let her know I have spoken with Dr. Craige Cotta about this, and that it may be a chronic problem. Let her know we want to collect a sputum sample and Dr. Craige Cotta will follow up with her regarding the plan of care at her August 28th appointment. Please order a sputum for culture, fungus and AFB. Please let her know to call if she needs to be seen sooner. Thanks

## 2015-12-05 NOTE — Assessment & Plan Note (Signed)
Follow up for pneumonia: CXR done this am Plan: We will call you with your CXR results. The pulmonary nodules in your lungs appear stable based on the CT done 10/2015. Continue your Spiriva daily. Remember to rinse your mouth after use. Use your Albuterol as needed for shortness of breath or wheezing. Rest when you can.  Follow up with Dr. Craige Cotta in August as is already scheduled.

## 2015-12-06 ENCOUNTER — Telehealth: Payer: Self-pay | Admitting: Pulmonary Disease

## 2015-12-06 ENCOUNTER — Other Ambulatory Visit: Payer: Medicare Other

## 2015-12-06 NOTE — Telephone Encounter (Signed)
Spoke with pt in the lobby. I explained to her that she will need to go downstairs for her sputum culture. She voiced understanding and had no further questions. Nothing further needed at this time.

## 2015-12-20 ENCOUNTER — Telehealth: Payer: Self-pay | Admitting: Acute Care

## 2015-12-20 NOTE — Telephone Encounter (Signed)
Pt returning call to nurse.Jenna Carrillo ° °

## 2015-12-20 NOTE — Telephone Encounter (Signed)
lmtcb

## 2015-12-20 NOTE — Telephone Encounter (Signed)
Per pt's chart, she was advised to provide a sputum sample for culture, fungus and AFB per the 7.19.17 phone note and incomplete resolution of PNA on cxr.  Pt picked up the specimen cup on 7.20.17.  She stated she has tried x2 weeks to produce an adequate sample and is unable to do so.  She stated she produces a lot of "spit" but cannot produce any mucus from her lungs.  Pt is scheduled to follow up with VS on 8.28.17 VS please advise, thank you.

## 2015-12-24 NOTE — Telephone Encounter (Signed)
Patient states that she is still feeling really bad.  Last Xray still showed that she has pneumonia.  She has not been able to produce any sputum.  She said that when she coughs, she does not produce anything.  Feels very fatigued all the time.  No chest tightness at this time.  Cough has gotten better.  Daughter is concerned that she still has pneumonia.  Patient wants to know if she needs any medication, does she need to have another Xray? (Patient is 82 years old and takes care of her adult son who has MS.)   Patient has a follow up appointment scheduled with Dr. Craige Cotta on 01/14/16.   Dr. Sherene Sires, please advise in Dr. Evlyn Courier absence.  Assessment/Plan  CAP (community acquired pneumonia) Follow up for pneumonia: CXR done this am Plan: We will call you with your CXR results. The pulmonary nodules in your lungs appear stable based on the CT done 10/2015. Continue your Spiriva daily. Remember to rinse your mouth after use. Use your Albuterol as needed for shortness of breath or wheezing. Rest when you can.  Follow up with Dr. Craige Cotta in August as is already scheduled.   COPD COPD well controlled on current medication regimen Plan: Continue Spiriva daily Continue Albuterol As needed Follow up with Dr. Craige Cotta in August as scheduled. Please contact office for sooner follow up if symptoms do not improve or worsen or seek emergency care   BRONCHIECTASIS Mild progression per CT 10/2015 Currently clinically improved  after 2 rounds of Levaquin. No fever, purulent secretions. Plan: CXR today  Sputum Culture Follow up with Dr. Craige Cotta 01/14/2016

## 2015-12-24 NOTE — Telephone Encounter (Signed)
Will need ov with all meds in hand  - ok to see NP or me as add on

## 2015-12-24 NOTE — Telephone Encounter (Signed)
Spoke with pt and she states that she can come in tomorrow while her son is getting med infusion that takes 3 hours. That way she does not have to get a sitter for him. Pt of VS. MW recommends OV for symptoms.  TP - can we use the 11am slot for this pt as an acute visit?  Thanks!

## 2015-12-24 NOTE — Telephone Encounter (Signed)
Pt daughter cb, [phone], daughter states patient is still feeling bad and no med were ever prescribed, please advise

## 2015-12-24 NOTE — Telephone Encounter (Signed)
Per TP  Okay to schedule appointment  Called spoke with pt. Scheduled her for ov with TP on 12/25/15 at 11am. She voiced understanding and had no further questions. Nothing further needed at this time.

## 2015-12-25 ENCOUNTER — Ambulatory Visit (INDEPENDENT_AMBULATORY_CARE_PROVIDER_SITE_OTHER): Payer: Medicare Other | Admitting: Adult Health

## 2015-12-25 ENCOUNTER — Encounter: Payer: Self-pay | Admitting: Adult Health

## 2015-12-25 DIAGNOSIS — J479 Bronchiectasis, uncomplicated: Secondary | ICD-10-CM

## 2015-12-25 MED ORDER — FLUTTER DEVI: 0 refills | Status: DC

## 2015-12-25 NOTE — Progress Notes (Signed)
Subjective:    Patient ID: Jenna Carrillo, female    DOB: 1931-04-07, 82 y.o.   MRN: [id number]  HPI 82 year old female former smoker with COPD and bronchiectasis History of polymyalgia rheumatica on chronic low-dose steroids,  12/25/2015 Follow up : PNA /Bronchiectasis  Patient returns for a three-week follow-up. She was recently treated for a pneumonia in May of this year with Levaquin for 7 days. Chest x-ray on showed a right middle lobe atelectasis versus infiltrate. This was slow to resolve on chest x-ray and a subsequent CT chest on 11/27/2015 showed Scattered granulomas throughout both lungs, moderate bronchiectasis-mild progression since 2012. There was bandlike consolidation and volume loss in the right middle lobe and lingula consistent with a post infectious scarring. These areas were suspicious for MAI. Patient says she has no cough . She was recommended to send a sputum culture and AFB. However, patient says she's unable to cough up any mucus. She says she just does not have any energy. We discussed her most recent CT chest results and the need for sputum culture possible.. She denies any hemoptysis, orthopnea, PND, or increased leg swelling and no fever.   TEST  PFT 02/02/09 >> FEV1 0.99(53%), FVC 1.58(58%), FEV1% 63, TLC 3.79(79%), DLCO 51%, +BD Spirometry 01/09/11 >> FEV1 0.97 (49%), FEV1% 67  Past Medical History:  Diagnosis Date  . Bronchiectasis   . COPD (chronic obstructive pulmonary disease) (HCC)   . Degenerative disk disease   . Eczema   . Gastric ulcer   . GERD (gastroesophageal reflux disease)   . Hiatal hernia   . Hyperlipidemia   . Hypertension   . Hypothyroidism   . Osteoarthritis   . Polymyalgia rheumatica (HCC)   . Pulmonary nodule, right   . Vertebrobasilar insufficiency    Current Outpatient Prescriptions on File Prior to Visit  Medication Sig Dispense Refill  . albuterol (PROAIR HFA) 108 (90 BASE) MCG/ACT inhaler Inhale 2 puffs into the lungs  every 4 (four) hours as needed.      Marland Kitchen amitriptyline (ELAVIL) 50 MG tablet Take 50 mg by mouth at bedtime.    Marland Kitchen levothyroxine (SYNTHROID, LEVOTHROID) 88 MCG tablet Take 88 mcg by mouth daily before breakfast.    . metoprolol (TOPROL-XL) 50 MG 24 hr tablet Take 50 mg by mouth 2 (two) times daily.     . Multiple Vitamin (MULTIVITAMIN) tablet Take 1 tablet by mouth daily.      Marland Kitchen omeprazole (PRILOSEC) 20 MG capsule Take 20 mg by mouth daily as needed.      . predniSONE (DELTASONE) 1 MG tablet 3 tablets a day    . tiotropium (SPIRIVA) 18 MCG inhalation capsule Place 1 capsule (18 mcg total) into inhaler and inhale daily. 30 capsule 5   No current facility-administered medications on file prior to visit.     Review of Systems Constitutional:   No  weight loss, night sweats,  Fevers, chills,  +fatigue, or  lassitude.  HEENT:   No headaches,  Difficulty swallowing,  Tooth/dental problems, or  Sore throat,                No sneezing, itching, ear ache, nasal congestion, post nasal drip,   CV:  No chest pain,  Orthopnea, PND, swelling in lower extremities, anasarca, dizziness, palpitations, syncope.   GI  No heartburn, indigestion, abdominal pain, nausea, vomiting, diarrhea, change in bowel habits, loss of appetite, bloody stools.   Resp: .  No excess mucus, no productive cough,  No non-productive  cough,  No coughing up of blood.  No change in color of mucus.  No wheezing.  No chest wall deformity  Skin: no rash or lesions.  GU: no dysuria, change in color of urine, no urgency or frequency.  No flank pain, no hematuria   MS:  No joint pain or swelling.  No decreased range of motion.  No back pain.  Psych:  No change in mood or affect. No depression or anxiety.  No memory loss.         Objective:   Physical Exam Vitals:   12/25/15 1107  BP: 120/64  Pulse: 84  Temp: 97.6 F (36.4 C)  TempSrc: Oral  SpO2: 98%  Weight: 130 lb (59 kg)  Height: 5\' 3"  (1.6 m)   GEN: A/Ox3; pleasant  , NAD,Frail and elderly   HEENT:  [place]/AT,  EACs-clear, TMs-wnl, NOSE-clear, THROAT-clear, no lesions, no postnasal drip or exudate noted.   NECK:  Supple w/ fair ROM; no JVD; normal carotid impulses w/o bruits; no thyromegaly or nodules palpated; no lymphadenopathy.    RESP decreased breath sounds in the bases . no accessory muscle use, no dullness to percussion  CARD:  RRR, no m/r/g  , no peripheral edema, pulses intact, no cyanosis or clubbing.  GI:   Soft & nt; nml bowel sounds; no organomegaly or masses detected.   Musco: Warm bil, no deformities or joint swelling noted.   Neuro: alert, no focal deficits noted.    Skin: Warm, no lesions or rashes  Leshae Mcclay NP-C  [facility]  12/25/2015

## 2015-12-25 NOTE — Patient Instructions (Addendum)
Try Mucinex liquid Twice daily  For congestion  Add Flutter valve Three times a day  .  Return sputum culture if able .  follow up Dr. Craige Cotta  In 2 weeks as planned and As needed   Please contact office for sooner follow up if symptoms do not improve or worsen or seek emergency care

## 2015-12-25 NOTE — Assessment & Plan Note (Signed)
Recent slowly resolving flare with possible pneumonia. CT chest is suspicious for MAI Would like to obtain a sputum culture and AFB. If possible. Patient's add in Mucinex twice daily along with flutter valve. She was given a Xopenex nebulizer treatment in office but was unable to give a sputum culture. She has been instructed to return sputum culture and AFB of possible  Plan  Patient Instructions  Try Mucinex liquid Twice daily  For congestion  Add Flutter valve Three times a day  .  Return sputum culture if able .  follow up Dr. Craige Cotta  In 2 weeks as planned and As needed   Please contact office for sooner follow up if symptoms do not improve or worsen or seek emergency care

## 2015-12-26 NOTE — Progress Notes (Signed)
Reviewed and agree with assessment/plan.  Derita Michelsen, MD [place] [facility]/Critical Care 12/26/2015, 10:00 AM Pager:  [phone] 

## 2016-01-14 ENCOUNTER — Ambulatory Visit (INDEPENDENT_AMBULATORY_CARE_PROVIDER_SITE_OTHER): Payer: Medicare Other | Admitting: Pulmonary Disease

## 2016-01-14 ENCOUNTER — Encounter: Payer: Self-pay | Admitting: Pulmonary Disease

## 2016-01-14 VITALS — BP 118/72 | HR 78 | Ht 63.0 in | Wt 131.2 lb

## 2016-01-14 DIAGNOSIS — J479 Bronchiectasis, uncomplicated: Secondary | ICD-10-CM | POA: Diagnosis not present

## 2016-01-14 DIAGNOSIS — M353 Polymyalgia rheumatica: Secondary | ICD-10-CM

## 2016-01-14 DIAGNOSIS — R918 Other nonspecific abnormal finding of lung field: Secondary | ICD-10-CM

## 2016-01-14 MED ORDER — ALBUTEROL SULFATE HFA 108 (90 BASE) MCG/ACT IN AERS: 2.0000 | INHALATION_SPRAY | RESPIRATORY_TRACT | 5 refills | Status: DC | PRN

## 2016-01-14 MED ORDER — TIOTROPIUM BROMIDE MONOHYDRATE 18 MCG IN CAPS: 18.0000 ug | ORAL_CAPSULE | Freq: Every day | RESPIRATORY_TRACT | 5 refills | Status: DC

## 2016-01-14 NOTE — Progress Notes (Signed)
Current Outpatient Prescriptions on File Prior to Visit  Medication Sig  . amitriptyline (ELAVIL) 50 MG tablet Take 50 mg by mouth at bedtime.  Marland Kitchen levothyroxine (SYNTHROID, LEVOTHROID) 88 MCG tablet Take 88 mcg by mouth daily before breakfast.  . metoprolol (TOPROL-XL) 50 MG 24 hr tablet Take 50 mg by mouth 2 (two) times daily.   . Multiple Vitamin (MULTIVITAMIN) tablet Take 1 tablet by mouth daily.    Marland Kitchen omeprazole (PRILOSEC) 20 MG capsule Take 20 mg by mouth daily as needed.    . predniSONE (DELTASONE) 1 MG tablet 3 tablets a day  . Respiratory Therapy Supplies (FLUTTER) DEVI Use as directed (Patient not taking: Reported on 01/14/2016)   No current facility-administered medications on file prior to visit.     Chief Complaint  Patient presents with  . Follow-up    Pt reports breathing is fair. reports occas wheezing/chest tightness. Pt did not use the flutter valve.     Past medical history HH, GERD, PUD, Hypothyroidism, HTN, HLD  PSHx, Medications, Allergies, Fhx, Shx reviewed.  Tests PFT 02/02/09 >> FEV1 0.99(53%), FVC 1.58(58%), FEV1% 63, TLC 3.79(79%), DLCO 51%, +BD Spirometry 01/09/11 >> FEV1 0.97 (49%), FEV1% 67 CT chest 11/27/15 >> scattered nodules, moderate cylindrical/varicoid BTX Rt > Lt lung, patchy tree in bud  Vital signs BP 118/72   Pulse 78   Ht 5\' 3"  (1.6 m)   Wt 131 lb 3.2 oz (59.5 kg)   SpO2 93%   BMI 23.24 kg/m   History of Present Illness: Jenna Carrillo is a 82 y.o. female COPD, and bronchiectasis with history of polymyalgia rheumatica.   She is not having much cough, and not bringing up sputum.  She didn't send sputum sample.  She denies chest pain, fever, or hemoptysis.  She needs new proair >> hasn't used for a while.  She remains on 3 mg prednisone.  Physical Exam:  General - No distress ENT - No sinus tenderness, no oral exudate, no LAN, clear nasal drainage Cardiac - s1s2 regular, no murmur Chest - no wheeze Back - No focal tenderness Abd -  Soft, non-tender Ext - No edema Neuro - Normal strength Skin - ecchymosis Lt upper arm Psych - normal mood, and behavior   Assessment/Plan:  COPD with Bronchiectasis. - continue spiriva and prn albuterol - defer further airway/sputum sampling >> she would like to pursue a conservative management approach  Polymyalgia rheumatica. - she continues on 3 mg prednisone daily per her PCP   Patient Instructions  Follow up in 6 months   97, MD [place] [facility]/Critical Care/Sleep Pager:  [phone] 01/14/2016, 2:49 PM

## 2016-01-14 NOTE — Patient Instructions (Signed)
Follow up in 6 months 

## 2016-03-05 DIAGNOSIS — H40003 Preglaucoma, unspecified, bilateral: Secondary | ICD-10-CM | POA: Insufficient documentation

## 2016-03-05 DIAGNOSIS — H2513 Age-related nuclear cataract, bilateral: Secondary | ICD-10-CM | POA: Insufficient documentation

## 2016-07-27 IMAGING — CT CT CHEST W/O CM
2 of 4 series · 15 of 36 positions shown, 18 images · non-contrast
Comparison: 01/02/2011 chest CT and 11/12/2015 chest radiograph.

CLINICAL DATA: Follow-up pulmonary nodules.

EXAM:
CT CHEST WITHOUT CONTRAST
TECHNIQUE: Multidetector CT imaging of the chest was performed following the
standard protocol without IV contrast.

[Series 2: thorax · axial · 0.64mm/px · z∈[-264,-22]mm · 12 of 144 slices shown, 15 images]
[im 12/144  mediastinal]
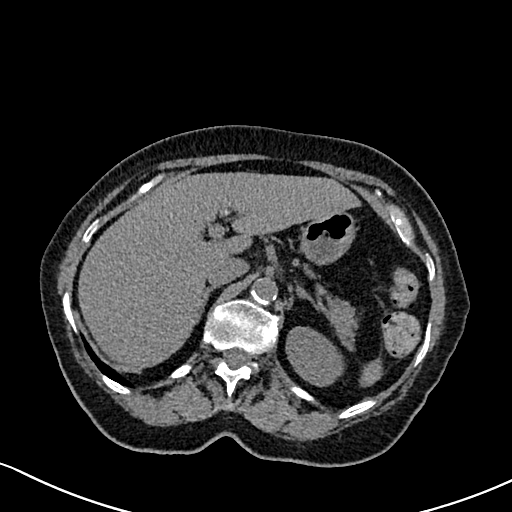
[im 12/144  lung]
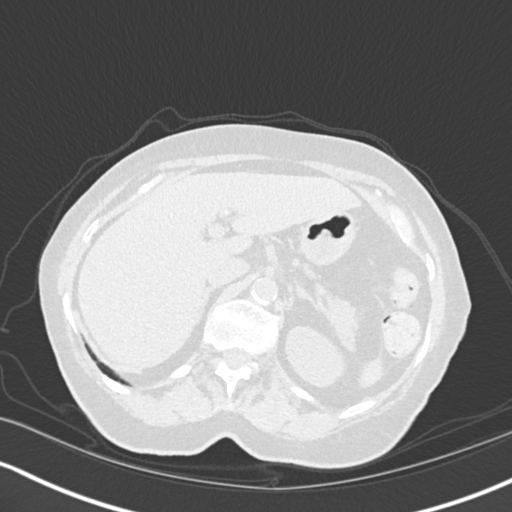
[im 23/144  lung]
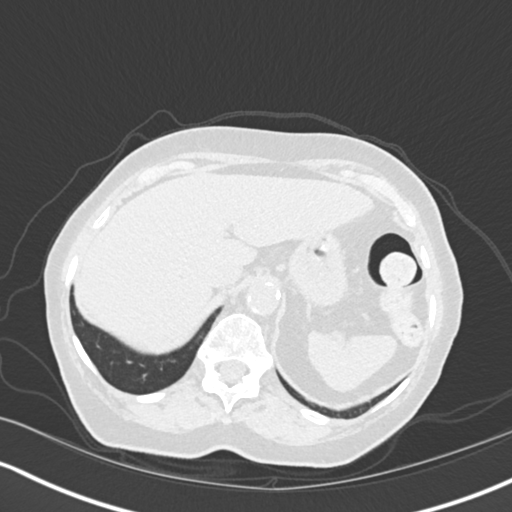
[im 34/144  lung]
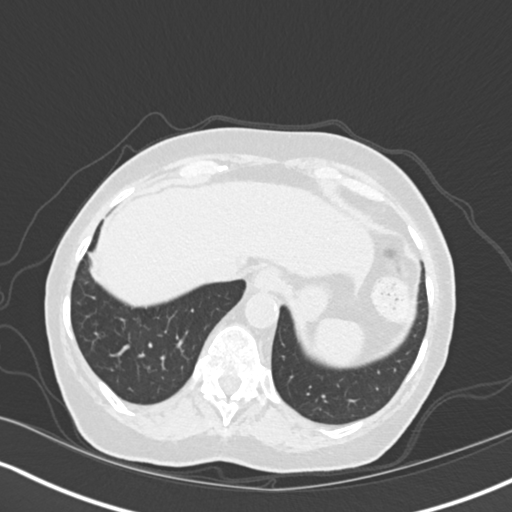
[im 45/144  lung]
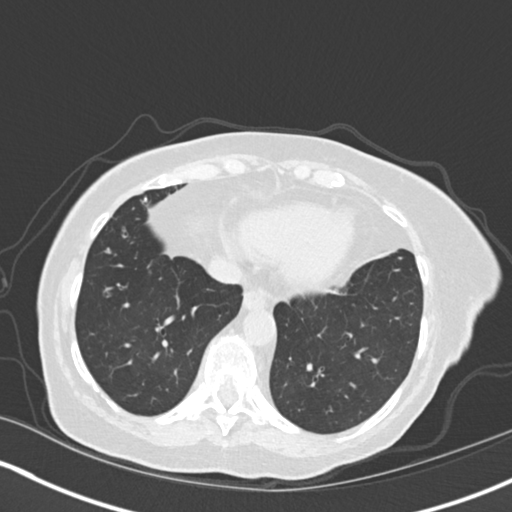
[im 56/144  mediastinal]
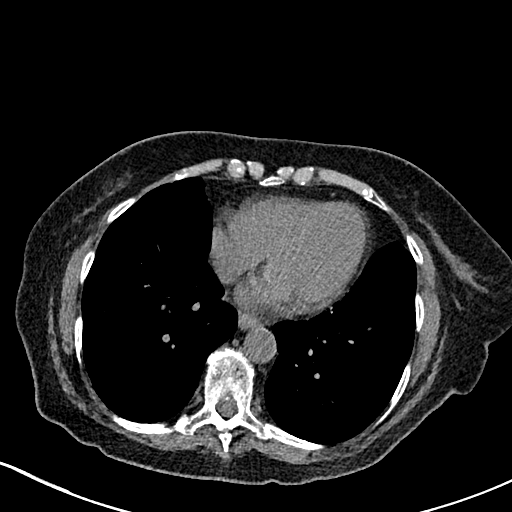
[im 56/144  lung]
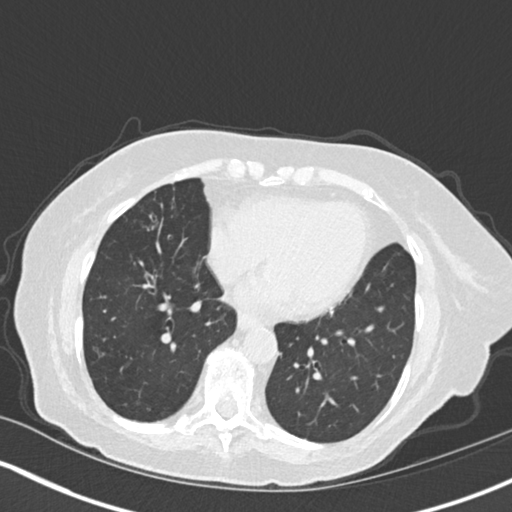
[im 67/144  lung]
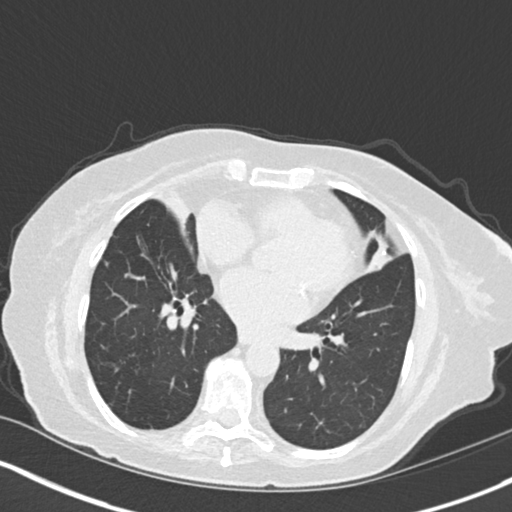
[im 78/144  lung]
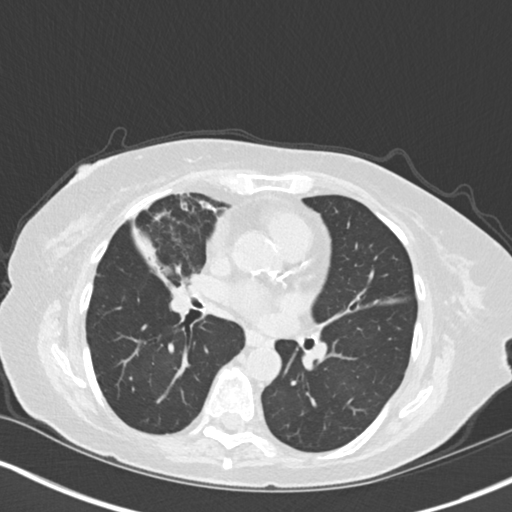
[im 89/144  lung]
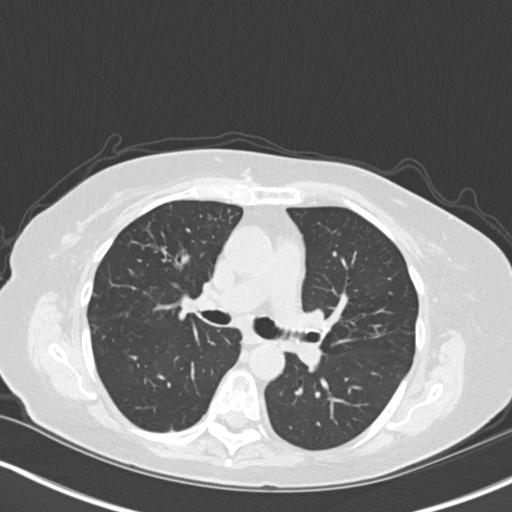
[im 100/144  mediastinal]
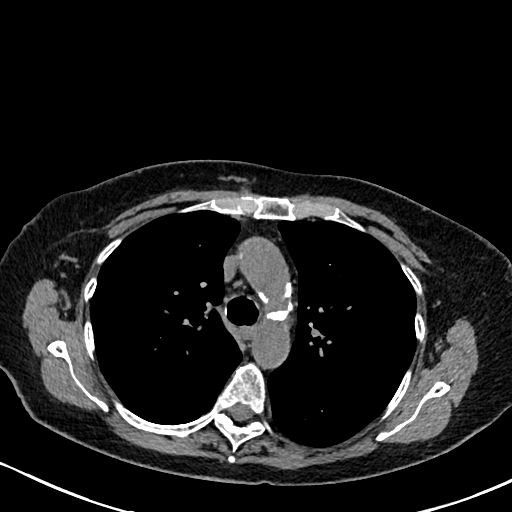
[im 100/144  lung]
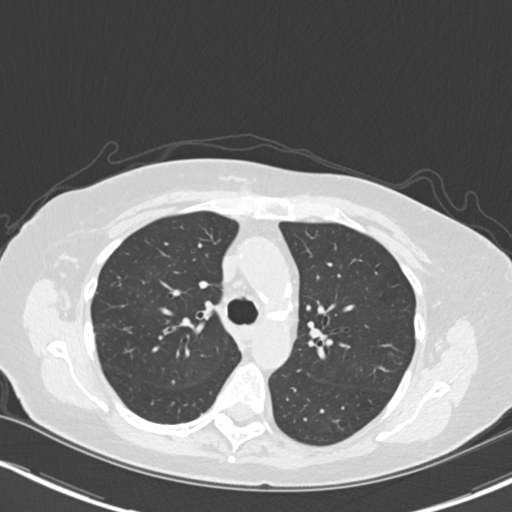
[im 111/144  lung]
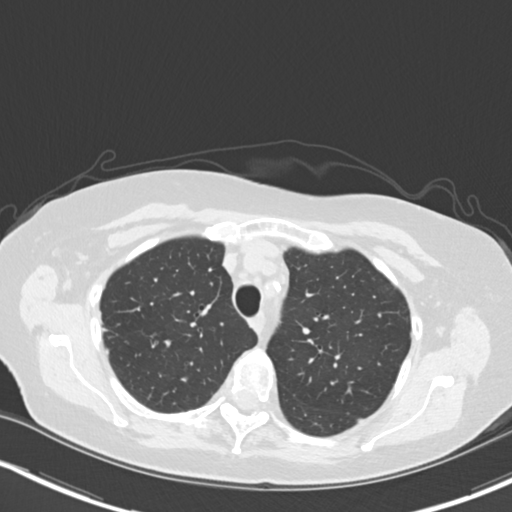
[im 122/144  lung]
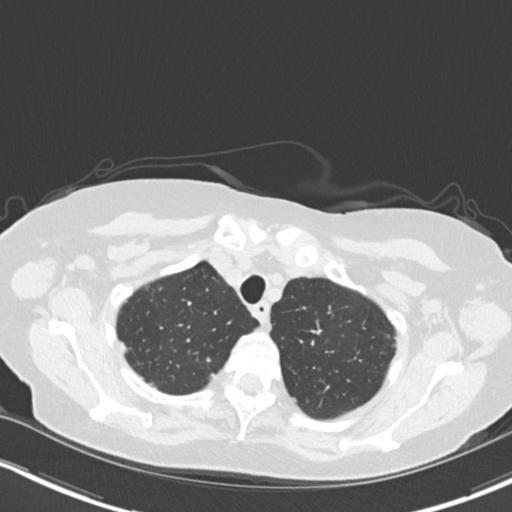
[im 133/144  lung]
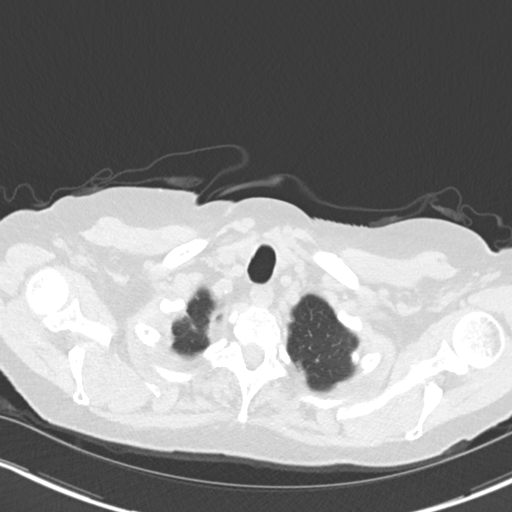

[Series 5: coronal · coronal · 0.59mm/px · 3 of 109 slices shown]
[im 22/109  lung]
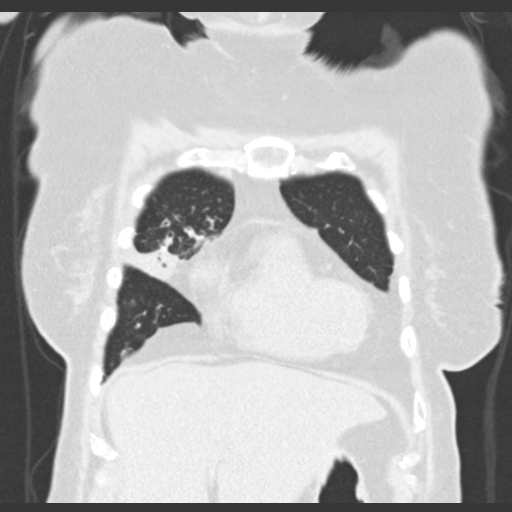
[im 44/109  lung]
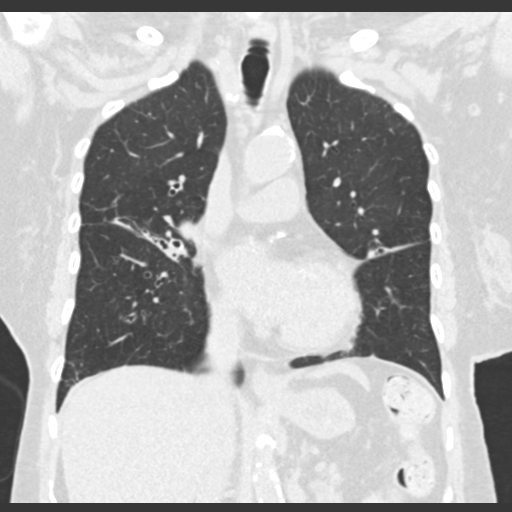
[im 65/109  lung]
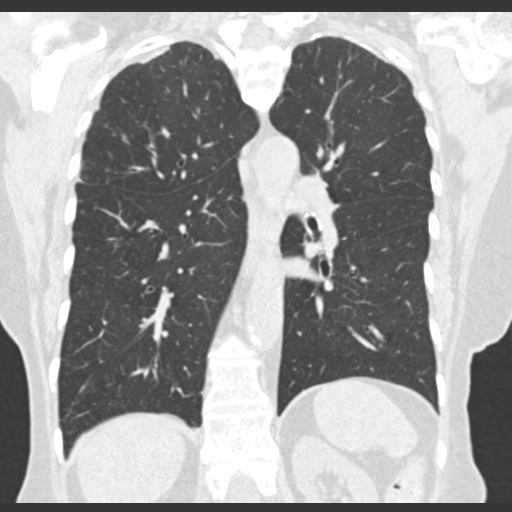

[15 of 36 positions shown; findings below may reference images not displayed]

FINDINGS: Mediastinum/Nodes: Normal heart size. No significant pericardial
fluid/thickening. Left main and left anterior descending coronary
atherosclerosis. Atherosclerotic nonaneurysmal thoracic aorta.
Normal caliber pulmonary arteries. No discrete thyroid nodules.
Unremarkable esophagus. No pathologically enlarged axillary,
mediastinal or gross hilar lymph nodes, noting limited sensitivity
for the detection of hilar adenopathy on this noncontrast study.

Lungs/Pleura: No pneumothorax. No pleural effusion. Stable calcified
9 mm posterior right upper lobe granuloma. Additional scattered
smaller subcentimeter calcified granulomas throughout both lungs.
There is moderate cylindrical and varicoid bronchiectasis in the
basilar right upper lobe, right middle lobe, peripheral right lower
lobe and lingula with mild progression since 7377 particularly in
the basilar right upper lobe and right lower lobe. There is
associated bandlike consolidation and volume loss in the right
middle lobe and lingula consistent with postinfectious/
postinflammatory scarring, mildly progressed. Mild patchy
tree-in-bud opacities are seen at the areas of bronchiectasis.
Additional scattered tiny centrilobular nodules in both lungs
measuring up to 4 mm appear stable. No acute consolidative airspace
disease or new significant pulmonary nodules.

Upper abdomen: Unremarkable.

Musculoskeletal: No aggressive appearing focal osseous lesions.
Moderate thoracic spondylosis.
IMPRESSION: 1. Chest CT findings of chronic infectious bronchiolitis due to
atypical mycobacterial infection ([place]) including patchy areas of
bronchiectasis, tree-in-bud opacities, bandlike scarring and
centrilobular nodularity in both lungs, with mild progression since
7377, particularly in the right upper and right lower lobes.
2. No thoracic adenopathy.
3. Aortic atherosclerosis. Left main and 1 vessel coronary
atherosclerosis.

## 2016-10-31 ENCOUNTER — Other Ambulatory Visit: Payer: Self-pay | Admitting: Acute Care

## 2016-12-09 ENCOUNTER — Other Ambulatory Visit: Payer: Self-pay | Admitting: Orthopedic Surgery

## 2016-12-09 DIAGNOSIS — M545 Low back pain: Principal | ICD-10-CM

## 2016-12-09 DIAGNOSIS — G8929 Other chronic pain: Secondary | ICD-10-CM

## 2016-12-18 ENCOUNTER — Ambulatory Visit
Admission: RE | Admit: 2016-12-18 | Discharge: 2016-12-18 | Disposition: A | Discharge status: Home / Self Care | Payer: Medicare Other | Source: Ambulatory Visit | Attending: Orthopedic Surgery | Admitting: Orthopedic Surgery

## 2016-12-18 DIAGNOSIS — G8929 Other chronic pain: Secondary | ICD-10-CM

## 2016-12-18 DIAGNOSIS — M545 Low back pain: Principal | ICD-10-CM

## 2016-12-18 MED ORDER — METHYLPREDNISOLONE ACETATE 40 MG/ML INJ SUSP (RADIOLOG
120.0000 mg | Freq: Once | INTRAMUSCULAR | Status: AC
  Administered 2016-12-18: 120 mg via EPIDURAL

## 2016-12-18 MED ORDER — IOPAMIDOL (ISOVUE-M 200) INJECTION 41%
1.0000 mL | Freq: Once | INTRAMUSCULAR | Status: AC
  Administered 2016-12-18: 1 mL via EPIDURAL

## 2016-12-18 NOTE — Discharge Instructions (Signed)

## 2016-12-20 ENCOUNTER — Emergency Department (HOSPITAL_BASED_OUTPATIENT_CLINIC_OR_DEPARTMENT_OTHER)
Admission: EM | Admit: 2016-12-20 | Discharge: 2016-12-20 | Disposition: A | Discharge status: Home / Self Care | Payer: Medicare Other | Attending: Emergency Medicine | Admitting: Emergency Medicine

## 2016-12-20 ENCOUNTER — Encounter (HOSPITAL_BASED_OUTPATIENT_CLINIC_OR_DEPARTMENT_OTHER): Payer: Self-pay | Admitting: *Deleted

## 2016-12-20 DIAGNOSIS — L0292 Furuncle, unspecified: Secondary | ICD-10-CM

## 2016-12-20 DIAGNOSIS — I10 Essential (primary) hypertension: Secondary | ICD-10-CM | POA: Insufficient documentation

## 2016-12-20 DIAGNOSIS — J449 Chronic obstructive pulmonary disease, unspecified: Secondary | ICD-10-CM | POA: Diagnosis not present

## 2016-12-20 DIAGNOSIS — L02426 Furuncle of left lower limb: Secondary | ICD-10-CM | POA: Diagnosis not present

## 2016-12-20 DIAGNOSIS — Z79899 Other long term (current) drug therapy: Secondary | ICD-10-CM | POA: Diagnosis not present

## 2016-12-20 DIAGNOSIS — L02416 Cutaneous abscess of left lower limb: Secondary | ICD-10-CM | POA: Diagnosis present

## 2016-12-20 DIAGNOSIS — Z87891 Personal history of nicotine dependence: Secondary | ICD-10-CM | POA: Insufficient documentation

## 2016-12-20 MED ORDER — DOXYCYCLINE HYCLATE 100 MG PO CAPS: 100.0000 mg | ORAL_CAPSULE | Freq: Two times a day (BID) | ORAL | 0 refills | Status: DC

## 2016-12-20 NOTE — ED Provider Notes (Signed)
[facility] Provider Note   CSN: [id number] Arrival date & time: 12/20/16  4332     History   Chief Complaint Chief Complaint  Patient presents with  . Abscess    HPI Jenna Carrillo is a 83 y.o. female.  HPI Patient states she's had a sore on her left calf for the past week. It has been red and draining some fluid. The area is about the size of a pea.  She has not had any fevers. She has not had any chills. No other systemic symptoms. Patient thought she noticed some pus underneath the skin surface today so she decided to get it checked out. Past Medical History:  Diagnosis Date  . Bronchiectasis   . COPD (chronic obstructive pulmonary disease) (HCC)   . Degenerative disk disease   . Eczema   . Gastric ulcer   . GERD (gastroesophageal reflux disease)   . Hiatal hernia   . Hyperlipidemia   . Hypertension   . Hypothyroidism   . Osteoarthritis   . Polymyalgia rheumatica (HCC)   . Pulmonary nodule, right   . Vertebrobasilar insufficiency     Patient Active Problem List   Diagnosis Date Noted  . Nuclear sclerosis of both eyes 03/05/2016  . Glaucoma suspect, bilateral 03/05/2016  . CAP (community acquired pneumonia) 11/12/2015  . Pulmonary nodules 10/12/2015  . Cough 02/03/2014  . Macular degeneration, left eye 12/28/2013  . Glaucoma suspect, both eyes 12/28/2013  . Allergic rhinitis 11/12/2012  . Nuclear cataract 08/08/2011  . BRONCHIECTASIS 03/09/2009  . COPD 03/09/2009  . Polymyalgia rheumatica (HCC) 01/31/2009    Past Surgical History:  Procedure Laterality Date  . BREAST CYST EXCISION     right  . broken tail bone    . CAROTID ENDARTERECTOMY     right  . RHINOPLASTY    . TOTAL ABDOMINAL HYSTERECTOMY      OB History    No data available       Home Medications    Prior to Admission medications   Medication Sig Start Date End Date Taking? Authorizing Provider  albuterol (PROAIR HFA) 108 (90 Base) MCG/ACT inhaler Inhale 2 puffs into  the lungs every 4 (four) hours as needed. 01/14/16   [name], MD  amitriptyline (ELAVIL) 50 MG tablet Take 50 mg by mouth at bedtime.    [provider]  doxycycline (VIBRAMYCIN) 100 MG capsule Take 1 capsule (100 mg total) by mouth 2 (two) times daily. 12/20/16   Linwood Dibbles, MD  levothyroxine (SYNTHROID, LEVOTHROID) 88 MCG tablet Take 88 mcg by mouth daily before breakfast.    [provider]  metoprolol (TOPROL-XL) 50 MG 24 hr tablet Take 50 mg by mouth 2 (two) times daily.     [provider]  moxifloxacin (VIGAMOX) 0.5 % ophthalmic solution Place 1 drop into the right eye 4 times daily.    [provider]  Multiple Vitamin (MULTIVITAMIN) tablet Take 1 tablet by mouth daily.      [provider]  omeprazole (PRILOSEC) 20 MG capsule Take 20 mg by mouth daily as needed.      [provider]  predniSONE (DELTASONE) 1 MG tablet 3 tablets a day    [provider]  Respiratory Therapy Supplies (FLUTTER) DEVI Use as directed Patient not taking: Reported on 01/14/2016 12/25/15   Parrett, Virgel Bouquet, NP  tiotropium (SPIRIVA) 18 MCG inhalation capsule Place 1 capsule (18 mcg total) into inhaler and inhale daily. 01/14/16   [name],  MD    Family History Family History  Problem Relation Age of Onset  . COPD Mother   . Breast cancer Mother   . Emphysema Mother   . Liver disease Son   . Emphysema Father   . Heart attack Father     Social History Social History  Substance Use Topics  . Smoking status: Former Smoker    Packs/day: 2.00    Years: 2.00    Types: Cigarettes    Quit date: 05/19/1958  . Smokeless tobacco: Never Used  . Alcohol use No     Allergies   Sulfonamide derivatives   Review of Systems Review of Systems  All other systems reviewed and are negative.    Physical Exam Updated Vital Signs BP (!) 152/64 (BP Location: Left Arm)   Pulse 70   Temp 98.4 F (36.9 C) (Oral)   Resp 18   Ht 1.6 m (5\' 3" )   Wt  60 kg (132 lb 4.4 oz)   SpO2 100%   BMI 23.43 kg/m   Physical Exam  Constitutional: She appears well-developed and well-nourished. No distress.  HENT:  Head: Normocephalic and atraumatic.  Right Ear: External ear normal.  Left Ear: External ear normal.  Eyes: Conjunctivae are normal. Right eye exhibits no discharge. Left eye exhibits no discharge. No scleral icterus.  Neck: Neck supple. No tracheal deviation present.  Cardiovascular: Normal rate.   Pulmonary/Chest: Effort normal. No stridor. No respiratory distress.  Abdominal: She exhibits no distension.  Musculoskeletal: She exhibits tenderness. She exhibits no edema.  0.5 centimeter circular lesion on the medial aspect of the right calf,  area is raised, surrounding erythema, small pustule versus dead skin at the skin surface  Neurological: She is alert. Cranial nerve deficit: no gross deficits.  Skin: Skin is warm and dry. No rash noted.  Psychiatric: She has a normal mood and affect.  Nursing note and vitals reviewed.    ED Treatments / Results  Labs (all labs ordered are listed, but only abnormal results are displayed) Labs Reviewed - No data to display   Procedures .Marland KitchenIncision and Drainage Date/Time: 12/20/2016 10:18 AM Performed by: Linwood Dibbles Authorized by: Linwood Dibbles   Consent:    Consent obtained:  Verbal   Consent given by:  Patient   Risks discussed:  Bleeding, incomplete drainage, pain and damage to other organs   Alternatives discussed:  No treatment Location:    Indications for incision and drainage: furuncle.   Location:  Lower extremity   Lower extremity location:  Leg Pre-procedure details:    Skin preparation:  Betadine Anesthesia (see MAR for exact dosages):    Anesthesia method:  None Procedure type:    Complexity:  Simple Procedure details:    Incision type: 18 gauge needle used to remove the superficial epidermal skin,    Incision depth:  Dermal   Packing materials:  None Post-procedure  details:    Patient tolerance of procedure:  Tolerated well, no immediate complications   (including critical care time)  Medications Ordered in ED Medications - No data to display   Initial Impression / Assessment and Plan / ED Course  I have reviewed the triage vital signs and the nursing notes.  Pertinent labs & imaging results that were available during my care of the patient were reviewed by me and considered in my medical decision making (see chart for details).   Small skin lesion.  No abscess identified.  Most likely infectious.  Devitalized tissue removed.  Applied  abx ointment.  Start on abx, follow up with pcp to make sure lesion resolves.  Final Clinical Impressions(s) / ED Diagnoses   Final diagnoses:  Furuncle    New Prescriptions New Prescriptions   DOXYCYCLINE (VIBRAMYCIN) 100 MG CAPSULE    Take 1 capsule (100 mg total) by mouth 2 (two) times daily.     Linwood Dibbles, MD 12/20/16 1020

## 2016-12-20 NOTE — ED Triage Notes (Signed)
Pt c/o abscess to right calf x 1 week

## 2016-12-20 NOTE — ED Notes (Signed)
Pt ambulatory, in NAD. 

## 2016-12-20 NOTE — Discharge Instructions (Signed)
Apply warm compresses or soak your leg in warm water several times per day. Take the antibiotics as prescribed. Monitor for fever or worsening symptoms. Follow-up with your primary care doctor or dermatologist to make sure the skin lesion resolves

## 2016-12-20 NOTE — ED Notes (Signed)
ED Provider at bedside. 

## 2017-03-04 ENCOUNTER — Other Ambulatory Visit: Payer: Self-pay | Admitting: Pulmonary Disease

## 2017-10-13 ENCOUNTER — Other Ambulatory Visit: Payer: Self-pay | Admitting: Pulmonary Disease

## 2017-10-14 ENCOUNTER — Telehealth: Payer: Self-pay | Admitting: Pulmonary Disease

## 2017-10-14 NOTE — Telephone Encounter (Signed)
Called and spoke with Patient's daughter, [name].  She was unaware that the Patient has not been seen since 12/2015.  She has appt with TP on 10/19/17.  I explained that we could refill at or after her OV and she could check with her PCP to see if they would refill Proair until she is seen here.  Patient stated understanding.  Nothing further needed at this time.

## 2017-10-14 NOTE — Telephone Encounter (Signed)
[name] returned call, [phone].

## 2017-10-14 NOTE — Telephone Encounter (Signed)
Attempted to call pt's daughter [name] but no answer.  Left message for [name] to return call x1

## 2017-10-19 ENCOUNTER — Ambulatory Visit: Payer: Medicare Other | Admitting: Adult Health

## 2017-10-21 ENCOUNTER — Ambulatory Visit: Payer: Medicare Other | Admitting: Adult Health

## 2017-10-21 ENCOUNTER — Encounter: Payer: Self-pay | Admitting: Adult Health

## 2017-10-21 DIAGNOSIS — J479 Bronchiectasis, uncomplicated: Secondary | ICD-10-CM

## 2017-10-21 MED ORDER — TIOTROPIUM BROMIDE MONOHYDRATE 18 MCG IN CAPS: 18.0000 ug | ORAL_CAPSULE | Freq: Every day | RESPIRATORY_TRACT | 11 refills | Status: DC

## 2017-10-21 NOTE — Progress Notes (Signed)
@Jenna Carrillo  ID: , female    DOB: 08-01-1930, 84 y.o.   MRN: [id number]  Chief Complaint  Jenna Carrillo presents with  . Follow-up    Bronchiectasis    Referring provider: [id number], MD  HPI: 84 year old female former smoker with COPD and bronchiectasis History of polymyalgia rheumatica on chronic low-dose steroids,   Tests PFT 02/02/09 >> FEV1 0.99(53%), FVC 1.58(58%), FEV1% 63, TLC 3.79(79%), DLCO 51%, +BD Spirometry 01/09/11 >> FEV1 0.97 (49%), FEV1% 67 CT chest 11/27/15 >> scattered nodules, moderate cylindrical/varicoid BTX Rt > Lt lung, patchy tree in bud  10/21/2017 Follow up : Bronchiectasis /COPD  Jenna Carrillo presents for a follow-up.  Jenna Carrillo has underlying COPD with bronchiectasis.  Last seen in the office in 2017 Is maintained on Spiriva and as needed albuterol.  Jenna Carrillo says overall her breathing is doing okay.  She does have a daily cough.  I have recommended Mucinex and flutter in the past.  Jenna Carrillo says she does not like the flutter valve.  Previously recommended to have follow-up chest x-ray and sputum sampling to rule out MAI.  Jenna Carrillo has declined this in the past. Denies any hemoptysis, weight loss, edema. Says she remains active.     Allergies  Allergen Reactions  . Sulfonamide Derivatives     Immunization History  Administered Date(s) Administered  . Influenza Split 02/16/2013, 02/19/2015, 12/18/2015  . Influenza Whole 03/09/2009, 02/16/2010  . Pneumococcal Conjugate-13 12/18/2015  . Pneumococcal Polysaccharide-23 01/31/2009    Past Medical History:  Diagnosis Date  . Bronchiectasis   . COPD (chronic obstructive pulmonary disease) (HCC)   . Degenerative disk disease   . Eczema   . Gastric ulcer   . GERD (gastroesophageal reflux disease)   . Hiatal hernia   . Hyperlipidemia   . Hypertension   . Hypothyroidism   . Osteoarthritis   . Polymyalgia rheumatica (HCC)   . Pulmonary nodule, right   . Vertebrobasilar insufficiency     Tobacco  History: Social History   Tobacco Use  Smoking Status Former Smoker  . Packs/day: 2.00  . Years: 2.00  . Pack years: 4.00  . Types: Cigarettes  . Last attempt to quit: 05/19/1958  . Years since quitting: 59.4  Smokeless Tobacco Never Used   Counseling given: Not Answered   Outpatient Encounter Medications as of 10/21/2017  Medication Sig  . amitriptyline (ELAVIL) 50 MG tablet Take 50 mg by mouth at bedtime.  12/21/2017 levothyroxine (SYNTHROID, LEVOTHROID) 88 MCG tablet Take 88 mcg by mouth daily before breakfast.  . metoprolol (TOPROL-XL) 50 MG 24 hr tablet Take 50 mg by mouth 2 (two) times daily.   . Multiple Vitamin (MULTIVITAMIN) tablet Take 1 tablet by mouth daily.    Marland Kitchen omeprazole (PRILOSEC) 20 MG capsule Take 20 mg by mouth daily as needed.    . predniSONE (DELTASONE) 1 MG tablet 3 tablets a day  . Respiratory Therapy Supplies (FLUTTER) DEVI Use as directed  . albuterol (PROAIR HFA) 108 (90 Base) MCG/ACT inhaler Inhale 2 puffs into the lungs every 4 (four) hours as needed. (Jenna Carrillo not taking: Reported on 10/21/2017)  . tiotropium (SPIRIVA HANDIHALER) 18 MCG inhalation capsule Place 1 capsule (18 mcg total) into inhaler and inhale daily.  . [DISCONTINUED] doxycycline (VIBRAMYCIN) 100 MG capsule Take 1 capsule (100 mg total) by mouth 2 (two) times daily.  . [DISCONTINUED] moxifloxacin (VIGAMOX) 0.5 % ophthalmic solution Place 1 drop into the right eye 4 times daily.  . [DISCONTINUED] SPIRIVA HANDIHALER 18 MCG inhalation capsule PLACE 1  CAPSULE (18 MCG TOTAL) INTO INHALER AND INHALE DAILY. (Jenna Carrillo not taking: Reported on 10/21/2017)   No facility-administered encounter medications on file as of 10/21/2017.      Review of Systems  Constitutional:   No  weight loss, night sweats,  Fevers, chills, fatigue, or  lassitude.  HEENT:   No headaches,  Difficulty swallowing,  Tooth/dental problems, or  Sore throat,                No sneezing, itching, ear ache, nasal congestion, post nasal drip,    CV:  No chest pain,  Orthopnea, PND, swelling in lower extremities, anasarca, dizziness, palpitations, syncope.   GI  No heartburn, indigestion, abdominal pain, nausea, vomiting, diarrhea, change in bowel habits, loss of appetite, bloody stools.   Resp  No chest wall deformity  Skin: no rash or lesions.  GU: no dysuria, change in color of urine, no urgency or frequency.  No flank pain, no hematuria   MS:  No joint pain or swelling.  No decreased range of motion.  No back pain.    Physical Exam  BP 140/80 (BP Location: Left Arm, Cuff Size: Normal)   Pulse 76   Ht 5\' 1"  (1.549 m)   Wt 132 lb 6.4 oz (60.1 kg)   SpO2 96%   BMI 25.02 kg/m   GEN: A/Ox3; pleasant , NAD, elderly   HEENT:  /AT,  EACs-clear, TMs-wnl, NOSE-clear, THROAT-clear, no lesions, no postnasal drip or exudate noted.   NECK:  Supple w/ fair ROM; no JVD; normal carotid impulses w/o bruits; no thyromegaly or nodules palpated; no lymphadenopathy.    RESP  Clear  P & A; w/o, wheezes/ rales/ or rhonchi. no accessory muscle use, no dullness to percussion  CARD:  RRR, no m/r/g, no peripheral edema, pulses intact, no cyanosis or clubbing.  GI:   Soft & nt; nml bowel sounds; no organomegaly or masses detected.   Musco: Warm bil, no deformities or joint swelling noted.   Neuro: alert, no focal deficits noted.    Skin: Warm, no lesions or rashes    Lab Results:   BMET No results found for: NA, K, CL, CO2, GLUCOSE, BUN, CREATININE, CALCIUM, GFRNONAA, GFRAA  BNP No results found for: BNP  ProBNP No results found for: PROBNP  Imaging: No results found.   Assessment & Plan:   COPD Compensated on Spiriva  Plan  Jenna Carrillo Instructions  Continue on Spiriva daily .  Mucinex DM liquid Twice daily  For congestion  follow up Dr.  In 1 year and As needed         BRONCHIECTASIS Compensated without flare   Plan  Jenna Carrillo Instructions  Continue on Spiriva daily .  Mucinex DM liquid Twice  daily  For congestion  follow up Dr. Craige Cotta  In 1 year and As needed            Craige Cotta, NP 10/21/2017

## 2017-10-21 NOTE — Patient Instructions (Addendum)
Continue on Spiriva daily .  Mucinex DM liquid Twice daily  For congestion  follow up Dr. Craige Cotta  In 1 year and As needed

## 2017-10-21 NOTE — Assessment & Plan Note (Signed)
Compensated on Spiriva  Plan  Patient Instructions  Continue on Spiriva daily .  Mucinex DM liquid Twice daily  For congestion  follow up Dr. Craige Cotta  In 1 year and As needed

## 2017-10-21 NOTE — Assessment & Plan Note (Signed)
Compensated without flare   Plan  Patient Instructions  Continue on Spiriva daily .  Mucinex DM liquid Twice daily  For congestion  follow up Dr. Craige Cotta  In 1 year and As needed

## 2017-10-22 ENCOUNTER — Telehealth: Payer: Self-pay | Admitting: Pulmonary Disease

## 2017-10-22 MED ORDER — ALBUTEROL SULFATE HFA 108 (90 BASE) MCG/ACT IN AERS: 2.0000 | INHALATION_SPRAY | RESPIRATORY_TRACT | 5 refills | Status: DC | PRN

## 2017-10-22 NOTE — Telephone Encounter (Signed)
Called and spoke with patients daughter. Advised that we have sent in prescription. Nothing further needed.

## 2017-10-22 NOTE — Progress Notes (Signed)
Reviewed and agree with assessment/plan.   Aerica Rincon, MD [facility] 05/14/2016, 12:24 PM Pager:  [phone]  

## 2018-02-12 ENCOUNTER — Telehealth: Payer: Self-pay | Admitting: Pulmonary Disease

## 2018-02-12 ENCOUNTER — Encounter: Payer: Self-pay | Admitting: General Surgery

## 2018-02-12 NOTE — Telephone Encounter (Signed)
I have relayed this message to Annabelle Harman who was the person who originally called the pt's daughter.

## 2018-02-12 NOTE — Telephone Encounter (Signed)
[employer] [phone]) had to LVM for Victorino Dike (referral coordinator) because medical records staff were gone for the day. Advised in vmail pt scheduled for appt on 02/15/18 at 11:00 and a copy of OV note and xray are needed for visit. Requested for information to be faxed asap to our office and to call if there are any questions.

## 2018-02-15 ENCOUNTER — Ambulatory Visit: Payer: Medicare Other | Admitting: Nurse Practitioner

## 2018-02-15 ENCOUNTER — Ambulatory Visit (INDEPENDENT_AMBULATORY_CARE_PROVIDER_SITE_OTHER)
Admission: RE | Admit: 2018-02-15 | Discharge: 2018-02-15 | Disposition: A | Discharge status: Home / Self Care | Payer: Medicare Other | Source: Ambulatory Visit | Attending: Nurse Practitioner | Admitting: Nurse Practitioner

## 2018-02-15 ENCOUNTER — Encounter: Payer: Self-pay | Admitting: Nurse Practitioner

## 2018-02-15 ENCOUNTER — Other Ambulatory Visit (INDEPENDENT_AMBULATORY_CARE_PROVIDER_SITE_OTHER): Payer: Medicare Other

## 2018-02-15 VITALS — BP 134/66 | HR 65 | Ht 61.0 in | Wt 128.8 lb

## 2018-02-15 DIAGNOSIS — J181 Lobar pneumonia, unspecified organism: Secondary | ICD-10-CM | POA: Diagnosis not present

## 2018-02-15 DIAGNOSIS — J479 Bronchiectasis, uncomplicated: Secondary | ICD-10-CM

## 2018-02-15 LAB — BUN: BUN: 15 mg/dL (ref 6–23)

## 2018-02-15 LAB — CREATININE, SERUM: CREATININE: 0.92 mg/dL (ref 0.40–1.20)

## 2018-02-15 NOTE — Assessment & Plan Note (Signed)
Patient Instructions  Will order chest xray for follow up on persistent changes in right lower lung - will call with results Continue spriva and proventil May try mucinex and delsym for cough Follow up with Dr. Sood in 2 weeks or sooner if needed    

## 2018-02-15 NOTE — Patient Instructions (Addendum)
Will order chest xray for follow up on persistent changes in right lower lung - will call with results Continue spriva and proventil May try mucinex and delsym for cough Follow up with Dr. Craige Cotta in 2 weeks or sooner if needed

## 2018-02-15 NOTE — Progress Notes (Signed)
@Patient  ID: , female    DOB: 1931-03-20, 84 y.o.   MRN: [id number]  Chief Complaint  Patient presents with  . Results    Abnormal xray    Referring provider: [id number], MD   HPI 84 year old female former smoker with COPD and bronchiectasis followed by Dr. 97.  History includes polymyalgia rheumatica on chronic low-dose steroids.   Tests: PFT 02/02/09 >> FEV1 0.99(53%), FVC 1.58(58%), FEV1% 63, TLC 3.79(79%), DLCO 51%, +BD Spirometry 01/09/11 >> FEV1 0.97 (49%), FEV1% 67 CT chest 11/27/15 >> scattered nodules, moderate cylindrical/varicoid BTX Rt > Lt lung, patchy tree in bud Chest xray 01/21/18>> persistent infiltrative-type changes in the right lower lobe superimposed on probable chronic scaring. Recommend follow up chest x-ray in 3-4 weeks. If changes persists, chest CT with IV contrast would be necessary for further evaluation with respect to neoplastic process.   OV 02/15/18 - follow from PCP abnormal chest x ray Patient presents to follow up on a recent abnormal x ray. She was seen by her PCP on 01/21/18 and was treated with Levaquin for 7 days for right lower lobe pneumonia seen on x ray. She had a repeat x ray on 02/11/18 which still showed infiltrative type changes so her PCP wanted her to be seen by pulmonary. She states that she has been stable. She still has a non productive cough.  This has been ongoing for quiet sometime now. She refuses to use flutter valve. She denies hemoptysis, weight loss, edema. She was ordered sputum cultures in June, but states that the cough has always been non-productive.      Allergies  Allergen Reactions  . Sulfonamide Derivatives     Immunization History  Administered Date(s) Administered  . Influenza Split 02/16/2013, 02/19/2015, 12/18/2015  . Influenza Whole 03/09/2009, 02/16/2010  . Pneumococcal Conjugate-13 12/18/2015  . Pneumococcal Polysaccharide-23 01/31/2009    Past Medical History:  Diagnosis Date  .  Bronchiectasis   . COPD (chronic obstructive pulmonary disease) (HCC)   . Degenerative disk disease   . Eczema   . Gastric ulcer   . GERD (gastroesophageal reflux disease)   . Hiatal hernia   . Hyperlipidemia   . Hypertension   . Hypothyroidism   . Osteoarthritis   . Polymyalgia rheumatica (HCC)   . Pulmonary nodule, right   . Vertebrobasilar insufficiency     Tobacco History: Social History   Tobacco Use  Smoking Status Former Smoker  . Packs/day: 2.00  . Years: 2.00  . Pack years: 4.00  . Types: Cigarettes  . Last attempt to quit: 05/19/1958  . Years since quitting: 59.7  Smokeless Tobacco Never Used   Counseling given: Yes   Outpatient Encounter Medications as of 02/15/2018  Medication Sig  . albuterol (PROAIR HFA) 108 (90 Base) MCG/ACT inhaler Inhale 2 puffs into the lungs every 4 (four) hours as needed.  02/17/2018 amitriptyline (ELAVIL) 50 MG tablet Take 50 mg by mouth at bedtime.  Marland Kitchen levothyroxine (SYNTHROID, LEVOTHROID) 88 MCG tablet Take 88 mcg by mouth daily before breakfast.  . metoprolol (TOPROL-XL) 50 MG 24 hr tablet Take 50 mg by mouth 2 (two) times daily.   . Multiple Vitamin (MULTIVITAMIN) tablet Take 1 tablet by mouth daily.    Marland Kitchen omeprazole (PRILOSEC) 20 MG capsule Take 20 mg by mouth daily as needed.    . predniSONE (DELTASONE) 1 MG tablet 3 tablets a day  . Respiratory Therapy Supplies (FLUTTER) DEVI Use as directed  . tiotropium (SPIRIVA HANDIHALER) 18  MCG inhalation capsule Place 1 capsule (18 mcg total) into inhaler and inhale daily.   No facility-administered encounter medications on file as of 02/15/2018.      Review of Systems  Review of Systems  Constitutional: Negative.  Negative for activity change, appetite change, chills and fever.  HENT: Negative.  Negative for congestion.   Respiratory: Positive for cough. Negative for shortness of breath and wheezing.   Cardiovascular: Negative.  Negative for chest pain, palpitations and leg swelling.    Gastrointestinal: Negative.   Allergic/Immunologic: Negative.   Neurological: Negative.   Psychiatric/Behavioral: Negative.        Physical Exam  BP 134/66 (BP Location: Left Arm, Patient Position: Sitting, Cuff Size: Normal)   Pulse 65   Ht 5\' 1"  (1.549 m)   Wt 128 lb 12.8 oz (58.4 kg)   SpO2 98%   BMI 24.34 kg/m   Wt Readings from Last 5 Encounters:  02/15/18 128 lb 12.8 oz (58.4 kg)  10/21/17 132 lb 6.4 oz (60.1 kg)  12/20/16 132 lb 4.4 oz (60 kg)  01/14/16 131 lb 3.2 oz (59.5 kg)  12/25/15 130 lb (59 kg)     Physical Exam  Constitutional: She is oriented to person, place, and time. She appears well-developed and well-nourished. No distress.  Cardiovascular: Normal rate and regular rhythm.  Pulmonary/Chest: Effort normal and breath sounds normal. No respiratory distress. She has no wheezes. She has no rales.  Neurological: She is alert and oriented to person, place, and time.  Psychiatric: She has a normal mood and affect.  Nursing note and vitals reviewed.    Imaging: Dg Chest 2 View  Result Date: 02/15/2018 CLINICAL DATA:  Cough and congestion EXAM: CHEST - 2 VIEW COMPARISON:  December 05, 2015 FINDINGS: There is scarring in the right infrahilar region, stable. There is also scarring in the lung bases, stable. There is a small granuloma in the right upper lobe, stable. There is no appreciable edema or consolidation. Heart size and pulmonary vascularity are normal. No adenopathy. There is aortic atherosclerosis. Bones are osteoporotic. There is lower thoracic dextroscoliosis. There is postoperative change in the lower right neck region. IMPRESSION: Areas of lung scarring. Calcified granuloma right upper lobe. No edema or consolidation. Stable cardiac silhouette. There is aortic atherosclerosis. Aortic Atherosclerosis (ICD10-I70.0). Electronically Signed   By: December 07, 2015 III M.D.   On: 02/15/2018 12:11     Assessment & Plan:   Lobar pneumonia, unspecified  organism [facility]) Patient Instructions  Will order chest xray for follow up on persistent changes in right lower lung - will call with results Continue spriva and proventil May try mucinex and delsym for cough Follow up with [facility] in 2 weeks or sooner if needed     BRONCHIECTASIS Patient Instructions  Will order chest xray for follow up on persistent changes in right lower lung - will call with results Continue spriva and proventil May try mucinex and delsym for cough Follow up with Dr. Craige Cotta in 2 weeks or sooner if needed        Craige Cotta, NP 02/15/2018

## 2018-02-15 NOTE — Assessment & Plan Note (Signed)
Patient Instructions  Will order chest xray for follow up on persistent changes in right lower lung - will call with results Continue spriva and proventil May try mucinex and delsym for cough Follow up with Dr. Craige Cotta in 2 weeks or sooner if needed

## 2018-02-16 ENCOUNTER — Other Ambulatory Visit: Payer: Medicare Other

## 2018-02-16 NOTE — Progress Notes (Signed)
Reviewed and agree with assessment/plan.   Dorette Hartel, MD [facility] 05/14/2016, 12:24 PM Pager:  [phone]  

## 2018-03-09 ENCOUNTER — Ambulatory Visit: Payer: Medicare Other | Admitting: Pulmonary Disease

## 2018-03-09 ENCOUNTER — Encounter: Payer: Self-pay | Admitting: Pulmonary Disease

## 2018-03-09 VITALS — BP 120/58 | HR 77 | Ht 63.0 in | Wt 130.0 lb

## 2018-03-09 DIAGNOSIS — J479 Bronchiectasis, uncomplicated: Secondary | ICD-10-CM | POA: Diagnosis not present

## 2018-03-09 NOTE — Patient Instructions (Signed)
Call to schedule follow up if your breathing symptoms get worse.

## 2018-03-09 NOTE — Progress Notes (Signed)
Jenna Carrillo, Critical Care, and Sleep Medicine  Chief Complaint  Patient presents with  . Follow-up    Pt has improved some since last ov.    Constitutional:  BP (!) 120/58 (BP Location: Left Arm, Cuff Size: Normal)   Pulse 77   Ht 5\' 3"  (1.6 m)   Wt 130 lb (59 kg)   SpO2 96%   BMI 23.03 kg/m   Past Medical History:  DJD, Eczema, PUD, GERD, HH, HLD, HTN, Hypothyroidism, OA  Brief Summary:  Jenna Carrillo is a 84 y.o. female former smoker with COPD, and bronchiectasis with history of polymyalgia rheumatica.   I last saw her in 2017.  She was seen over the Summer when she had pneumonia.  Follow up CXR from September 2019 showed chronic changes.  She has post nasal drip and this causes throat irritation.  She will get a dry cough especially when she lays down.  She is not bringing up sputum.  Denies fever, chest pain, hemoptysis, wheezing.  Mucinex helps.  Doesn't like flutter valve.  Still on prednisone for PMR and spiriva.  Physical Exam:   Appearance - well kempt  ENMT - clear nasal mucosa, midline nasal septum, no oral exudates, no LAN, trachea midline Respiratory - normal chest wall, normal respiratory effort, no accessory muscle use, no wheeze/rales CV - s1s2 regular rate and rhythm, no murmurs, no peripheral edema, radial pulses symmetric GI - soft, non tender, no masses Lymph - no adenopathy noted in neck and axillary areas MSK - normal muscle strength and tone, normal gait Ext - no cyanosis, clubbing, or joint inflammation noted Skin - no rashes, lesions, or ulcers Neuro - oriented to person, place, and time Psych - normal mood and affect   Assessment/Plan:   COPD with Bronchiectasis. - continue spiriva and prn albuterol - prn mucinex - she will call for f/u if her symptoms progress   Patient Instructions  Call to schedule follow up if your breathing symptoms get worse.    October 2019, MD [place] Carrillo/Critical Care Pager:  [phone] 03/09/2018, 12:47 PM  Flow Sheet     Carrillo tests:  PFT 02/02/09 >> FEV1 0.99(53%), FVC 1.58(58%), FEV1% 63, TLC 3.79(79%), DLCO 51%, +BD Spirometry 01/09/11 >> FEV1 0.97 (49%), FEV1% 67 CT chest 11/27/15 >> scattered nodules, moderate cylindrical/varicoid BTX Rt > Lt lung, patchy tree in bud   Medications:   Allergies as of 03/09/2018      Reactions   Sulfonamide Derivatives       Medication List        Accurate as of 03/09/18 12:47 PM. Always use your most recent med list.          albuterol 108 (90 Base) MCG/ACT inhaler Commonly known as:  PROVENTIL HFA;VENTOLIN HFA Inhale 2 puffs into the lungs every 4 (four) hours as needed.   amitriptyline 50 MG tablet Commonly known as:  ELAVIL Take 50 mg by mouth at bedtime.   levothyroxine 88 MCG tablet Commonly known as:  SYNTHROID, LEVOTHROID Take 88 mcg by mouth daily before breakfast.   metoprolol succinate 50 MG 24 hr tablet Commonly known as:  TOPROL-XL Take 50 mg by mouth 2 (two) times daily.   multivitamin tablet Take 1 tablet by mouth daily.   omeprazole 20 MG capsule Commonly known as:  PRILOSEC Take 20 mg by mouth daily as needed.   predniSONE 1 MG tablet Commonly known as:  DELTASONE 3 tablets a day   tiotropium 18 MCG inhalation capsule Commonly  known as:  SPIRIVA Place 1 capsule (18 mcg total) into inhaler and inhale daily.       Past Surgical History:  She  has a past surgical history that includes Total abdominal hysterectomy; Breast cyst excision; Rhinoplasty; broken tail bone; and Carotid endarterectomy.  Family History:  Her family history includes Breast cancer in her mother; COPD in her mother; Emphysema in her father and mother; Heart attack in her father; Liver disease in her son.  Social History:  She  reports that she quit smoking about 59 years ago. Her smoking use included cigarettes. She has a 4.00 pack-year smoking history. She has never used smokeless tobacco.  She reports that she does not drink alcohol or use drugs.

## 2018-11-05 ENCOUNTER — Other Ambulatory Visit: Payer: Self-pay | Admitting: Adult Health

## 2018-12-08 ENCOUNTER — Other Ambulatory Visit: Payer: Self-pay | Admitting: Pulmonary Disease

## 2018-12-20 ENCOUNTER — Ambulatory Visit (INDEPENDENT_AMBULATORY_CARE_PROVIDER_SITE_OTHER): Payer: Medicare Other | Admitting: Nurse Practitioner

## 2018-12-20 ENCOUNTER — Other Ambulatory Visit: Payer: Self-pay

## 2018-12-20 ENCOUNTER — Telehealth: Payer: Self-pay | Admitting: Pulmonary Disease

## 2018-12-20 ENCOUNTER — Encounter: Payer: Self-pay | Admitting: Nurse Practitioner

## 2018-12-20 DIAGNOSIS — J471 Bronchiectasis with (acute) exacerbation: Secondary | ICD-10-CM

## 2018-12-20 MED ORDER — BENZONATATE 200 MG PO CAPS: 200.0000 mg | ORAL_CAPSULE | Freq: Three times a day (TID) | ORAL | 1 refills | Status: DC | PRN

## 2018-12-20 MED ORDER — DOXYCYCLINE HYCLATE 100 MG PO TABS: 100.0000 mg | ORAL_TABLET | Freq: Two times a day (BID) | ORAL | 0 refills | Status: DC

## 2018-12-20 NOTE — Telephone Encounter (Signed)
Returned call to daughter, [name] regarding patient.  Complaint of cough, low grade fever 99.64F. Using spiriva, mucinex and albuterol inhaler but still coughing.  Patient unable to sleep due to cough per daughter.  Still on low dose prednisone daily.  Patient had seen primary care 12/17/18 but cough 'wasn't as bad then' so did not request medication.  Offered televisit since patient does not use mychart and patient has not been seen in the office since Oct 2019.  Daughter agreed to schedule televisit today at 1:30 pm with Eduardo Osier, NP.  Nothing further needed.

## 2018-12-20 NOTE — Progress Notes (Signed)
Virtual Visit via Telephone Note  I connected with Jenna Carrillo on 12/20/18 at  1:30 PM EDT by telephone and verified that I am speaking with the correct person using two identifiers.  Location: Patient: home Provider: office   I discussed the limitations, risks, security and privacy concerns of performing an evaluation and management service by telephone and the availability of in person appointments. I also discussed with the patient that there may be a patient responsible charge related to this service. The patient expressed understanding and agreed to proceed.   History of Present Illness: 85 year old female former smoker with COPD, bronchiectasis, history of polymyalgia rheumatica who is followed by Dr. Halford Chessman. Maintenance: Spiriva, albuterol  Patient has a tele-visit today for an acute visit.  Patient's daughter is on the phone with her today.  He states that she has had increased cough over the past 3 days.  She states that cough is nonproductive.  She states that she has had a low-grade fever of 99.49F for the past 2 days.  She reports having chills yesterday.  She is compliant with Breo but has albuterol use associated.  Patient has been taking Mucinex.  Patient refuses to use her flutter valve device.  Patient's daughter says that she has been isolating with current care with pandemic and thinks that she is very low risk for COVID.  Does not want to have a testing at this time.  Denies n/v/d, hemoptysis, PND, leg swelling.     Observations/Objective: PFT 02/02/09 >> FEV1 0.99(53%), FVC 1.58(58%), FEV1% 63, TLC 3.79(79%), DLCO 51%, +BD Spirometry 01/09/11 >> FEV1 0.97 (49%), FEV1% 67 CT chest 11/27/15 >> scattered nodules, moderate cylindrical/varicoid BTX Rt > Lt lung, patchy tree in bud  Assessment and Plan: Bronchiectasis with (acute) exacerbation ([place])  Patient has a tele-visit today for an acute visit.  Patient's daughter is on the phone with her today.  He states that she has  had increased cough over the past 3 days.  She states that cough is nonproductive.  She states that she has had a low-grade fever of 99.49F for the past 2 days.  She reports having chills yesterday.  She is compliant with Breo but has albuterol use associated.  Patient has been taking Mucinex.  Patient refuses to use her flutter valve device.  Patient's daughter says that she has been isolating with current care with pandemic and thinks that she is very low risk for COVID.  Does not want to have a testing at this time.   Patient Instructions  Will order doxycycline Will order Tessalon Perles Continue Spiriva Continue albuterol Continue Mucinex May take Tylenol for fever Make sure you are getting adequate fluids   Follow Up Instructions: Follow up with Dr. Halford Chessman in 3 months or sooner if needed    I discussed the assessment and treatment plan with the patient. The patient was provided an opportunity to ask questions and all were answered. The patient agreed with the plan and demonstrated an understanding of the instructions.   The patient was advised to call back or seek an in-person evaluation if the symptoms worsen or if the condition fails to improve as anticipated.  I provided 22 minutes of non-face-to-face time during this encounter.   Fenton Foy, NP

## 2018-12-20 NOTE — Assessment & Plan Note (Addendum)
Patient has a tele-visit today for an acute visit.  Patient's daughter is on the phone with her today.  He states that she has had increased cough over the past 3 days.  She states that cough is nonproductive.  She states that she has had a low-grade fever of 99.39F for the past 2 days.  She reports having chills yesterday.  She is compliant with Breo but has albuterol use associated.  Patient has been taking Mucinex.  Patient refuses to use her flutter valve device.  Patient's daughter says that she has been isolating with current care with pandemic and thinks that she is very low risk for COVID.  Does not want to have a testing at this time.   Patient Instructions  Will order doxycycline Will order Tessalon Perles Continue Spiriva Continue albuterol Continue Mucinex May take Tylenol for fever Make sure you are getting adequate fluids  Follow up: Follow up with Dr. Halford Chessman in 3 months or sooner if needed

## 2018-12-20 NOTE — Patient Instructions (Signed)
Will order doxycycline Will order Tessalon Perles Continue Spiriva Continue albuterol Continue Mucinex May take Tylenol for fever Make sure you are getting adequate fluids  Follow up: Follow up with Dr. Halford Chessman in 3 months or sooner if needed

## 2019-01-07 ENCOUNTER — Other Ambulatory Visit: Payer: Self-pay | Admitting: Pulmonary Disease

## 2019-01-08 NOTE — Progress Notes (Signed)
Reviewed and agree with assessment/plan.   Lakela Kuba, MD  Pulmonary/Critical Care 05/14/2016, 12:24 PM Pager:  [phone]  

## 2019-01-24 ENCOUNTER — Other Ambulatory Visit: Payer: Self-pay

## 2019-01-24 ENCOUNTER — Emergency Department (HOSPITAL_COMMUNITY): Payer: Medicare Other

## 2019-01-24 ENCOUNTER — Inpatient Hospital Stay (HOSPITAL_COMMUNITY): Payer: Medicare Other

## 2019-01-24 ENCOUNTER — Encounter (HOSPITAL_COMMUNITY): Payer: Self-pay | Admitting: Emergency Medicine

## 2019-01-24 ENCOUNTER — Inpatient Hospital Stay (HOSPITAL_COMMUNITY)
Admission: EM | Admit: 2019-01-24 | Discharge: 2019-01-30 | DRG: 871 | Disposition: A | Discharge status: Home Health | Payer: Medicare Other | Attending: Internal Medicine | Admitting: Internal Medicine

## 2019-01-24 DIAGNOSIS — W19XXXA Unspecified fall, initial encounter: Secondary | ICD-10-CM

## 2019-01-24 DIAGNOSIS — A419 Sepsis, unspecified organism: Principal | ICD-10-CM | POA: Diagnosis present

## 2019-01-24 DIAGNOSIS — I959 Hypotension, unspecified: Secondary | ICD-10-CM | POA: Diagnosis present

## 2019-01-24 DIAGNOSIS — D72829 Elevated white blood cell count, unspecified: Secondary | ICD-10-CM

## 2019-01-24 DIAGNOSIS — Z23 Encounter for immunization: Secondary | ICD-10-CM | POA: Diagnosis present

## 2019-01-24 DIAGNOSIS — Z882 Allergy status to sulfonamides status: Secondary | ICD-10-CM

## 2019-01-24 DIAGNOSIS — R000 Tachycardia, unspecified: Secondary | ICD-10-CM | POA: Diagnosis not present

## 2019-01-24 DIAGNOSIS — J440 Chronic obstructive pulmonary disease with acute lower respiratory infection: Secondary | ICD-10-CM | POA: Diagnosis present

## 2019-01-24 DIAGNOSIS — Z66 Do not resuscitate: Secondary | ICD-10-CM | POA: Diagnosis present

## 2019-01-24 DIAGNOSIS — E0580 Other thyrotoxicosis without thyrotoxic crisis or storm: Secondary | ICD-10-CM | POA: Diagnosis present

## 2019-01-24 DIAGNOSIS — E785 Hyperlipidemia, unspecified: Secondary | ICD-10-CM | POA: Diagnosis present

## 2019-01-24 DIAGNOSIS — Z7952 Long term (current) use of systemic steroids: Secondary | ICD-10-CM | POA: Diagnosis not present

## 2019-01-24 DIAGNOSIS — R109 Unspecified abdominal pain: Secondary | ICD-10-CM

## 2019-01-24 DIAGNOSIS — Z87440 Personal history of urinary (tract) infections: Secondary | ICD-10-CM | POA: Diagnosis not present

## 2019-01-24 DIAGNOSIS — Z803 Family history of malignant neoplasm of breast: Secondary | ICD-10-CM

## 2019-01-24 DIAGNOSIS — F05 Delirium due to known physiological condition: Secondary | ICD-10-CM | POA: Diagnosis present

## 2019-01-24 DIAGNOSIS — J449 Chronic obstructive pulmonary disease, unspecified: Secondary | ICD-10-CM

## 2019-01-24 DIAGNOSIS — I2510 Atherosclerotic heart disease of native coronary artery without angina pectoris: Secondary | ICD-10-CM | POA: Diagnosis present

## 2019-01-24 DIAGNOSIS — R410 Disorientation, unspecified: Secondary | ICD-10-CM | POA: Diagnosis not present

## 2019-01-24 DIAGNOSIS — G4700 Insomnia, unspecified: Secondary | ICD-10-CM | POA: Diagnosis present

## 2019-01-24 DIAGNOSIS — Z825 Family history of asthma and other chronic lower respiratory diseases: Secondary | ICD-10-CM

## 2019-01-24 DIAGNOSIS — W010XXA Fall on same level from slipping, tripping and stumbling without subsequent striking against object, initial encounter: Secondary | ICD-10-CM | POA: Diagnosis present

## 2019-01-24 DIAGNOSIS — G35 Multiple sclerosis: Secondary | ICD-10-CM | POA: Diagnosis present

## 2019-01-24 DIAGNOSIS — K5900 Constipation, unspecified: Secondary | ICD-10-CM

## 2019-01-24 DIAGNOSIS — G9340 Encephalopathy, unspecified: Secondary | ICD-10-CM | POA: Diagnosis present

## 2019-01-24 DIAGNOSIS — I5021 Acute systolic (congestive) heart failure: Secondary | ICD-10-CM | POA: Diagnosis not present

## 2019-01-24 DIAGNOSIS — I361 Nonrheumatic tricuspid (valve) insufficiency: Secondary | ICD-10-CM | POA: Diagnosis not present

## 2019-01-24 DIAGNOSIS — G9341 Metabolic encephalopathy: Secondary | ICD-10-CM

## 2019-01-24 DIAGNOSIS — R296 Repeated falls: Secondary | ICD-10-CM

## 2019-01-24 DIAGNOSIS — M1990 Unspecified osteoarthritis, unspecified site: Secondary | ICD-10-CM | POA: Diagnosis present

## 2019-01-24 DIAGNOSIS — R0602 Shortness of breath: Secondary | ICD-10-CM

## 2019-01-24 DIAGNOSIS — I4891 Unspecified atrial fibrillation: Secondary | ICD-10-CM | POA: Diagnosis not present

## 2019-01-24 DIAGNOSIS — I110 Hypertensive heart disease with heart failure: Secondary | ICD-10-CM | POA: Diagnosis not present

## 2019-01-24 DIAGNOSIS — Z20828 Contact with and (suspected) exposure to other viral communicable diseases: Secondary | ICD-10-CM | POA: Diagnosis present

## 2019-01-24 DIAGNOSIS — I480 Paroxysmal atrial fibrillation: Secondary | ICD-10-CM | POA: Diagnosis present

## 2019-01-24 DIAGNOSIS — T380X5A Adverse effect of glucocorticoids and synthetic analogues, initial encounter: Secondary | ICD-10-CM | POA: Diagnosis present

## 2019-01-24 DIAGNOSIS — J189 Pneumonia, unspecified organism: Secondary | ICD-10-CM | POA: Diagnosis present

## 2019-01-24 DIAGNOSIS — M353 Polymyalgia rheumatica: Secondary | ICD-10-CM | POA: Diagnosis present

## 2019-01-24 DIAGNOSIS — Z8249 Family history of ischemic heart disease and other diseases of the circulatory system: Secondary | ICD-10-CM

## 2019-01-24 DIAGNOSIS — Z79890 Hormone replacement therapy (postmenopausal): Secondary | ICD-10-CM

## 2019-01-24 DIAGNOSIS — Z8711 Personal history of peptic ulcer disease: Secondary | ICD-10-CM

## 2019-01-24 DIAGNOSIS — Z79899 Other long term (current) drug therapy: Secondary | ICD-10-CM | POA: Diagnosis not present

## 2019-01-24 DIAGNOSIS — I10 Essential (primary) hypertension: Secondary | ICD-10-CM

## 2019-01-24 DIAGNOSIS — E039 Hypothyroidism, unspecified: Secondary | ICD-10-CM | POA: Diagnosis present

## 2019-01-24 DIAGNOSIS — I471 Supraventricular tachycardia: Secondary | ICD-10-CM | POA: Diagnosis present

## 2019-01-24 DIAGNOSIS — F329 Major depressive disorder, single episode, unspecified: Secondary | ICD-10-CM | POA: Diagnosis present

## 2019-01-24 DIAGNOSIS — J9601 Acute respiratory failure with hypoxia: Secondary | ICD-10-CM | POA: Diagnosis present

## 2019-01-24 DIAGNOSIS — K219 Gastro-esophageal reflux disease without esophagitis: Secondary | ICD-10-CM | POA: Diagnosis present

## 2019-01-24 DIAGNOSIS — L309 Dermatitis, unspecified: Secondary | ICD-10-CM | POA: Diagnosis present

## 2019-01-24 DIAGNOSIS — Y92009 Unspecified place in unspecified non-institutional (private) residence as the place of occurrence of the external cause: Secondary | ICD-10-CM | POA: Diagnosis not present

## 2019-01-24 DIAGNOSIS — Z87891 Personal history of nicotine dependence: Secondary | ICD-10-CM

## 2019-01-24 DIAGNOSIS — J181 Lobar pneumonia, unspecified organism: Secondary | ICD-10-CM

## 2019-01-24 DIAGNOSIS — R531 Weakness: Secondary | ICD-10-CM | POA: Diagnosis not present

## 2019-01-24 DIAGNOSIS — J439 Emphysema, unspecified: Secondary | ICD-10-CM | POA: Diagnosis not present

## 2019-01-24 DIAGNOSIS — S0101XA Laceration without foreign body of scalp, initial encounter: Secondary | ICD-10-CM

## 2019-01-24 DIAGNOSIS — I214 Non-ST elevation (NSTEMI) myocardial infarction: Secondary | ICD-10-CM | POA: Diagnosis not present

## 2019-01-24 LAB — URINALYSIS, ROUTINE W REFLEX MICROSCOPIC
Bilirubin Urine: NEGATIVE
Glucose, UA: NEGATIVE mg/dL
Ketones, ur: 5 mg/dL — AB
Leukocytes,Ua: NEGATIVE
Nitrite: NEGATIVE
Protein, ur: 30 mg/dL — AB
Specific Gravity, Urine: 1.017 (ref 1.005–1.030)
pH: 5 (ref 5.0–8.0)

## 2019-01-24 LAB — CREATININE, SERUM
Creatinine, Ser: 0.88 mg/dL (ref 0.44–1.00)
GFR calc Af Amer: >60 mL/min (ref >60)
GFR calc non Af Amer: 60 mL/min — ABNORMAL LOW (ref >60)

## 2019-01-24 LAB — CBC
HCT: 39 % (ref 36.0–46.0)
HCT: 31.1 % — ABNORMAL LOW (ref 36.0–46.0)
Hemoglobin: 12.2 g/dL (ref 12.0–15.0)
Hemoglobin: 9.6 g/dL — ABNORMAL LOW (ref 12.0–15.0)
MCH: 30.1 pg (ref 26.0–34.0)
MCH: 30 pg (ref 26.0–34.0)
MCHC: 31.3 g/dL (ref 30.0–36.0)
MCHC: 30.9 g/dL (ref 30.0–36.0)
MCV: 96.3 fL (ref 80.0–100.0)
MCV: 97.2 fL (ref 80.0–100.0)
Platelets: 275 10*3/uL (ref 150–400)
Platelets: 217 10*3/uL (ref 150–400)
RBC: 4.05 MIL/uL (ref 3.87–5.11)
RBC: 3.2 MIL/uL — ABNORMAL LOW (ref 3.87–5.11)
RDW: 13.6 % (ref 11.5–15.5)
RDW: 13.7 % (ref 11.5–15.5)
WBC: 21.2 10*3/uL — ABNORMAL HIGH (ref 4.0–10.5)
WBC: 21.3 10*3/uL — ABNORMAL HIGH (ref 4.0–10.5)
nRBC: 0 % (ref 0.0–0.2)
nRBC: 0 % (ref 0.0–0.2)

## 2019-01-24 LAB — DIFFERENTIAL
Abs Immature Granulocytes: 0.23 10*3/uL — ABNORMAL HIGH (ref 0.00–0.07)
Basophils Absolute: 0.1 10*3/uL (ref 0.0–0.1)
Basophils Relative: 0 %
Eosinophils Absolute: 0.3 10*3/uL (ref 0.0–0.5)
Eosinophils Relative: 1 %
Immature Granulocytes: 1 %
Lymphocytes Relative: 2 %
Lymphs Abs: 0.4 10*3/uL — ABNORMAL LOW (ref 0.7–4.0)
Monocytes Absolute: 1.6 10*3/uL — ABNORMAL HIGH (ref 0.1–1.0)
Monocytes Relative: 8 %
Neutro Abs: 18.8 10*3/uL — ABNORMAL HIGH (ref 1.7–7.7)
Neutrophils Relative %: 88 %

## 2019-01-24 LAB — COMPREHENSIVE METABOLIC PANEL
ALT: 19 U/L (ref 0–44)
AST: 30 U/L (ref 15–41)
Albumin: 3.3 g/dL — ABNORMAL LOW (ref 3.5–5.0)
Alkaline Phosphatase: 98 U/L (ref 38–126)
Anion gap: 11 (ref 5–15)
BUN: 18 mg/dL (ref 8–23)
CO2: 28 mmol/L (ref 22–32)
Calcium: 8.8 mg/dL — ABNORMAL LOW (ref 8.9–10.3)
Chloride: 98 mmol/L (ref 98–111)
Creatinine, Ser: 0.96 mg/dL (ref 0.44–1.00)
GFR calc Af Amer: >60 mL/min (ref >60)
GFR calc non Af Amer: 54 mL/min — ABNORMAL LOW (ref >60)
Glucose, Bld: 116 mg/dL — ABNORMAL HIGH (ref 70–99)
Potassium: 4 mmol/L (ref 3.5–5.1)
Sodium: 137 mmol/L (ref 135–145)
Total Bilirubin: 0.7 mg/dL (ref 0.3–1.2)
Total Protein: 6.8 g/dL (ref 6.5–8.1)

## 2019-01-24 LAB — BASIC METABOLIC PANEL
Anion gap: 5 (ref 5–15)
BUN: 15 mg/dL (ref 8–23)
CO2: 25 mmol/L (ref 22–32)
Calcium: 7.7 mg/dL — ABNORMAL LOW (ref 8.9–10.3)
Chloride: 108 mmol/L (ref 98–111)
Creatinine, Ser: 0.79 mg/dL (ref 0.44–1.00)
GFR calc Af Amer: >60 mL/min (ref >60)
GFR calc non Af Amer: >60 mL/min (ref >60)
Glucose, Bld: 135 mg/dL — ABNORMAL HIGH (ref 70–99)
Potassium: 3.5 mmol/L (ref 3.5–5.1)
Sodium: 138 mmol/L (ref 135–145)

## 2019-01-24 LAB — PROCALCITONIN: Procalcitonin: 4.19 ng/mL

## 2019-01-24 LAB — SARS CORONAVIRUS 2 BY RT PCR (HOSPITAL ORDER, PERFORMED IN ~~LOC~~ HOSPITAL LAB): SARS Coronavirus 2: NEGATIVE

## 2019-01-24 LAB — TSH: TSH: 0.029 u[IU]/mL — ABNORMAL LOW (ref 0.350–4.500)

## 2019-01-24 LAB — LACTIC ACID, PLASMA
Lactic Acid, Venous: 1 mmol/L (ref 0.5–1.9)
Lactic Acid, Venous: 1.3 mmol/L (ref 0.5–1.9)

## 2019-01-24 LAB — CK: Total CK: 288 U/L — ABNORMAL HIGH (ref 38–234)

## 2019-01-24 LAB — MRSA PCR SCREENING: MRSA by PCR: NEGATIVE

## 2019-01-24 LAB — D-DIMER, QUANTITATIVE: D-Dimer, Quant: 2.49 ug/mL-FEU — ABNORMAL HIGH (ref 0.00–0.50)

## 2019-01-24 MED ORDER — SODIUM CHLORIDE 0.9 % IV SOLN
2.0000 g | INTRAVENOUS | Status: DC
  Administered 2019-01-24 – 2019-01-26 (×3): 2 g via INTRAVENOUS
  Filled 2019-01-24: qty 20
  Filled 2019-01-24: qty 2
  Filled 2019-01-24: qty 20
  Filled 2019-01-24: qty 2

## 2019-01-24 MED ORDER — LEVALBUTEROL HCL 0.63 MG/3ML IN NEBU: 0.6300 mg | INHALATION_SOLUTION | RESPIRATORY_TRACT | Status: DC | PRN

## 2019-01-24 MED ORDER — BACITRACIN ZINC 500 UNIT/GM EX OINT
TOPICAL_OINTMENT | Freq: Once | CUTANEOUS | Status: AC
  Administered 2019-01-24: 1 via TOPICAL
  Filled 2019-01-24: qty 0.9

## 2019-01-24 MED ORDER — SODIUM CHLORIDE 0.9 % IV BOLUS
1000.0000 mL | Freq: Once | INTRAVENOUS | Status: AC
  Administered 2019-01-24: 1000 mL via INTRAVENOUS

## 2019-01-24 MED ORDER — DILTIAZEM HCL 30 MG PO TABS: 30.0000 mg | ORAL_TABLET | Freq: Four times a day (QID) | ORAL | Status: DC | PRN

## 2019-01-24 MED ORDER — SODIUM CHLORIDE (PF) 0.9 % IJ SOLN
INTRAMUSCULAR | Status: AC
  Administered 2019-01-24: 19:00:00
  Filled 2019-01-24: qty 50

## 2019-01-24 MED ORDER — SODIUM CHLORIDE 0.9 % IV BOLUS
500.0000 mL | Freq: Once | INTRAVENOUS | Status: AC
  Administered 2019-01-24: 500 mL via INTRAVENOUS

## 2019-01-24 MED ORDER — PHENYLEPHRINE HCL-NACL 10-0.9 MG/250ML-% IV SOLN
INTRAVENOUS | Status: AC
  Filled 2019-01-24: qty 250

## 2019-01-24 MED ORDER — SODIUM CHLORIDE 0.9 % IV SOLN
500.0000 mg | INTRAVENOUS | Status: DC
  Administered 2019-01-24 – 2019-01-25 (×2): 500 mg via INTRAVENOUS
  Filled 2019-01-24 (×2): qty 500

## 2019-01-24 MED ORDER — LIDOCAINE HCL (PF) 1 % IJ SOLN
INTRAMUSCULAR | Status: AC
  Administered 2019-01-24: 17:00:00
  Filled 2019-01-24: qty 30

## 2019-01-24 MED ORDER — ENOXAPARIN SODIUM 40 MG/0.4ML ~~LOC~~ SOLN
40.0000 mg | SUBCUTANEOUS | Status: DC
  Administered 2019-01-24 – 2019-01-27 (×3): 40 mg via SUBCUTANEOUS
  Filled 2019-01-24 (×3): qty 0.4

## 2019-01-24 MED ORDER — ORAL CARE MOUTH RINSE
15.0000 mL | Freq: Two times a day (BID) | OROMUCOSAL | Status: DC
  Administered 2019-01-24 – 2019-01-30 (×11): 15 mL via OROMUCOSAL

## 2019-01-24 MED ORDER — DILTIAZEM LOAD VIA INFUSION
20.0000 mg | Freq: Once | INTRAVENOUS | Status: AC
  Administered 2019-01-24: 13:00:00 20 mg via INTRAVENOUS
  Filled 2019-01-24: qty 20

## 2019-01-24 MED ORDER — METOPROLOL TARTRATE 25 MG PO TABS
25.0000 mg | ORAL_TABLET | Freq: Two times a day (BID) | ORAL | Status: DC
  Administered 2019-01-25 – 2019-01-26 (×3): 25 mg via ORAL
  Filled 2019-01-24 (×3): qty 1

## 2019-01-24 MED ORDER — LEVOTHYROXINE SODIUM 88 MCG PO TABS
88.0000 ug | ORAL_TABLET | Freq: Every day | ORAL | Status: DC
  Administered 2019-01-25 – 2019-01-26 (×2): 88 ug via ORAL
  Filled 2019-01-24 (×2): qty 1

## 2019-01-24 MED ORDER — PREDNISONE 1 MG PO TABS
3.0000 mg | ORAL_TABLET | Freq: Every day | ORAL | Status: DC
  Administered 2019-01-24 – 2019-01-30 (×7): 3 mg via ORAL
  Filled 2019-01-24 (×7): qty 3

## 2019-01-24 MED ORDER — IOHEXOL 350 MG/ML SOLN
100.0000 mL | Freq: Once | INTRAVENOUS | Status: AC | PRN
  Administered 2019-01-24: 80 mL via INTRAVENOUS

## 2019-01-24 MED ORDER — ACETAMINOPHEN 500 MG PO TABS
1000.0000 mg | ORAL_TABLET | Freq: Once | ORAL | Status: AC
  Administered 2019-01-24: 15:00:00 1000 mg via ORAL
  Filled 2019-01-24: qty 2

## 2019-01-24 MED ORDER — UMECLIDINIUM-VILANTEROL 62.5-25 MCG/INH IN AEPB
1.0000 | INHALATION_SPRAY | Freq: Every day | RESPIRATORY_TRACT | Status: DC
  Filled 2019-01-24: qty 14

## 2019-01-24 MED ORDER — TETANUS-DIPHTH-ACELL PERTUSSIS 5-2.5-18.5 LF-MCG/0.5 IM SUSP
0.5000 mL | Freq: Once | INTRAMUSCULAR | Status: AC
  Administered 2019-01-24: 0.5 mL via INTRAMUSCULAR
  Filled 2019-01-24: qty 0.5

## 2019-01-24 MED ORDER — ACETAMINOPHEN 325 MG PO TABS
650.0000 mg | ORAL_TABLET | Freq: Four times a day (QID) | ORAL | Status: DC | PRN
  Administered 2019-01-24 – 2019-01-27 (×4): 650 mg via ORAL
  Filled 2019-01-24 (×4): qty 2

## 2019-01-24 MED ORDER — CHLORHEXIDINE GLUCONATE CLOTH 2 % EX PADS
6.0000 | MEDICATED_PAD | Freq: Every day | CUTANEOUS | Status: DC
  Administered 2019-01-24 – 2019-01-29 (×5): 6 via TOPICAL

## 2019-01-24 MED ORDER — LIDOCAINE HCL (PF) 1 % IJ SOLN
10.0000 mL | Freq: Once | INTRAMUSCULAR | Status: AC
  Administered 2019-01-24: 12:00:00 10 mL via INTRADERMAL

## 2019-01-24 MED ORDER — DILTIAZEM HCL 100 MG IV SOLR
5.0000 mg/h | INTRAVENOUS | Status: DC
  Administered 2019-01-24: 5 mg/h via INTRAVENOUS
  Filled 2019-01-24: qty 100

## 2019-01-24 MED ORDER — SODIUM CHLORIDE 0.9 % IV BOLUS
1000.0000 mL | Freq: Once | INTRAVENOUS | Status: AC
  Administered 2019-01-24: 17:00:00 1000 mL via INTRAVENOUS

## 2019-01-24 MED ORDER — AMITRIPTYLINE HCL 25 MG PO TABS
50.0000 mg | ORAL_TABLET | Freq: Every day | ORAL | Status: DC
  Administered 2019-01-25 – 2019-01-29 (×5): 50 mg via ORAL
  Filled 2019-01-24 (×5): qty 2

## 2019-01-24 MED ORDER — IPRATROPIUM BROMIDE 0.02 % IN SOLN: 0.5000 mg | RESPIRATORY_TRACT | Status: DC | PRN

## 2019-01-24 MED ORDER — BUDESONIDE 0.5 MG/2ML IN SUSP
0.5000 mg | Freq: Two times a day (BID) | RESPIRATORY_TRACT | Status: DC
  Administered 2019-01-24: 0.5 mg via RESPIRATORY_TRACT
  Filled 2019-01-24: qty 2

## 2019-01-24 NOTE — ED Notes (Signed)
ED TO INPATIENT HANDOFF REPORT  Name/Age/Gender Jenna Carrillo 85 y.o. female  Code Status    Code Status Orders  (From admission, onward)         Start     Ordered   01/24/19 1425  Do not attempt resuscitation (DNR)  Continuous    Question Answer Comment  In the event of cardiac or respiratory ARREST Do not call a "code blue"   In the event of cardiac or respiratory ARREST Do not perform Intubation, CPR, defibrillation or ACLS   In the event of cardiac or respiratory ARREST Use medication by any route, position, wound care, and other measures to relive pain and suffering. May use oxygen, suction and manual treatment of airway obstruction as needed for comfort.      01/24/19 1428        Code Status History    This patient has a current code status but no historical code status.   Advance Care Planning Activity      Home/SNF/Other Home  Chief Complaint fall skin tears  Level of Care/Admitting Diagnosis ED Disposition    ED Disposition Condition Comment   Admit  Hospital Area: [facility] [[id number]]  Level of Care: Stepdown [14]  Admit to SDU based on following criteria: Severe physiological/psychological symptoms:  Any diagnosis requiring assessment & intervention at least every 4 hours on an ongoing basis to obtain desired patient outcomes including stability and rehabilitation  Covid Evaluation: Person Under Investigation (PUI)  Diagnosis: Sepsis [facility]) [[id number]]  Admitting Physician: Almon Hercules [[id number]]  Attending Physician: Almon Hercules [[id number]]  Estimated length of stay: past midnight tomorrow  Certification:: I certify this patient will need inpatient services for at least 2 midnights  PT Class (Do Not Modify): Inpatient [101]  PT Acc Code (Do Not Modify): Private [1]       Medical History Past Medical History:  Diagnosis Date  . Bronchiectasis   . COPD (chronic obstructive pulmonary disease) (HCC)   . Degenerative disk disease    . Eczema   . Gastric ulcer   . GERD (gastroesophageal reflux disease)   . Hiatal hernia   . Hyperlipidemia   . Hypertension   . Hypothyroidism   . Osteoarthritis   . Polymyalgia rheumatica (HCC)   . Pulmonary nodule, right   . Vertebrobasilar insufficiency     Allergies Allergies  Allergen Reactions  . Sulfonamide Derivatives     IV Location/Drains/Wounds Patient Lines/Drains/Airways Status   Active Line/Drains/Airways    Name:   Placement date:   Placement time:   Site:   Days:   Peripheral IV 01/24/19 Left Antecubital   01/24/19    1255    Antecubital   less than 1          Labs/Imaging Results for orders placed or performed during the hospital encounter of 01/24/19 (from the past 48 hour(s))  Urinalysis, Routine w reflex microscopic     Status: Abnormal   Collection Time: 01/24/19  9:21 AM  Result Value Ref Range   Color, Urine YELLOW YELLOW   APPearance CLEAR CLEAR   Specific Gravity, Urine 1.017 1.005 - 1.030   pH 5.0 5.0 - 8.0   Glucose, UA NEGATIVE NEGATIVE mg/dL   Hgb urine dipstick SMALL (A) NEGATIVE   Bilirubin Urine NEGATIVE NEGATIVE   Ketones, ur 5 (A) NEGATIVE mg/dL   Protein, ur 30 (A) NEGATIVE mg/dL   Nitrite NEGATIVE NEGATIVE   Leukocytes,Ua NEGATIVE NEGATIVE   RBC / HPF  0-5 0 - 5 RBC/hpf   WBC, UA 0-5 0 - 5 WBC/hpf   Bacteria, UA RARE (A) NONE SEEN   Squamous Epithelial / LPF 0-5 0 - 5   Mucus PRESENT    Hyaline Casts, UA PRESENT     Comment: Performed at [facility], [address] [place], [place] [address]  CBC     Status: Abnormal   Collection Time: 01/24/19 11:05 AM  Result Value Ref Range   WBC 21.2 (H) 4.0 - 10.5 K/uL   RBC 4.05 3.87 - 5.11 MIL/uL   Hemoglobin 12.2 12.0 - 15.0 g/dL   HCT 40.939.0 81.136.0 - 91.446.0 %   MCV 96.3 80.0 - 100.0 fL   MCH 30.1 26.0 - 34.0 pg   MCHC 31.3 30.0 - 36.0 g/dL   RDW 78.213.6 95.611.5 - 21.315.5 %   Platelets 275 150 - 400 K/uL   nRBC 0.0 0.0 - 0.2 %    Comment: Performed at [facility], [address] [place], [place] [address]  Comprehensive metabolic panel     Status: Abnormal   Collection Time: 01/24/19 11:05 AM  Result Value Ref Range   Sodium 137 135 - 145 mmol/L   Potassium 4.0 3.5 - 5.1 mmol/L   Chloride 98 98 - 111 mmol/L   CO2 28 22 - 32 mmol/L   Glucose, Bld 116 (H) 70 - 99 mg/dL   BUN 18 8 - 23 mg/dL   Creatinine, Ser 8.460.96 0.44 - 1.00 mg/dL   Calcium 8.8 (L) 8.9 - 10.3 mg/dL   Total Protein 6.8 6.5 - 8.1 g/dL   Albumin 3.3 (L) 3.5 - 5.0 g/dL   AST 30 15 - 41 U/L   ALT 19 0 - 44 U/L   Alkaline Phosphatase 98 38 - 126 U/L   Total Bilirubin 0.7 0.3 - 1.2 mg/dL   GFR calc non Af Amer 54 (L) >60 mL/min   GFR calc Af Amer >60 >60 mL/min   Anion gap 11 5 - 15    Comment: Performed at [facility], [address] [place], [place] [address]  Lactic acid, plasma     Status: None   Collection Time: 01/24/19  1:51 PM  Result Value Ref Range   Lactic Acid, Venous 1.0 0.5 - 1.9 mmol/L    Comment: Performed at [facility], [address] [place], [place] [address]  SARS Coronavirus 2 [facility](Hospital order, Performed in [facility] hospital lab) Nasopharyngeal Nasopharyngeal Swab     Status: None   Collection Time: 01/24/19  2:02 PM   Specimen: Nasopharyngeal Swab  Result Value Ref Range   SARS Coronavirus 2 NEGATIVE NEGATIVE    Comment: (NOTE) If result is NEGATIVE SARS-CoV-2 target nucleic acids are NOT DETECTED. The SARS-CoV-2 RNA is generally detectable in upper and lower  respiratory specimens during the acute phase of infection. The lowest  concentration of SARS-CoV-2 viral copies this assay can detect is 250  copies / mL. A negative result does not preclude SARS-CoV-2 infection  and should not be used as the sole basis for treatment or other  patient management decisions.  A negative result may occur with  improper specimen collection / handling, submission of specimen other  than nasopharyngeal  swab, presence of viral mutation(s) within the  areas targeted by this assay, and inadequate number of viral copies  (<250 copies / mL). A negative result must be combined with clinical  observations, patient history, and  epidemiological information. If result is POSITIVE SARS-CoV-2 target nucleic acids are DETECTED. The SARS-CoV-2 RNA is generally detectable in upper and lower  respiratory specimens dur ing the acute phase of infection.  Positive  results are indicative of active infection with SARS-CoV-2.  Clinical  correlation with patient history and other diagnostic information is  necessary to determine patient infection status.  Positive results do  not rule out bacterial infection or co-infection with other viruses. If result is PRESUMPTIVE POSTIVE SARS-CoV-2 nucleic acids MAY BE PRESENT.   A presumptive positive result was obtained on the submitted specimen  and confirmed on repeat testing.  While 2019 novel coronavirus  (SARS-CoV-2) nucleic acids may be present in the submitted sample  additional confirmatory testing may be necessary for epidemiological  and / or clinical management purposes  to differentiate between  SARS-CoV-2 and other Sarbecovirus currently known to infect humans.  If clinically indicated additional testing with an alternate test  methodology [phone]) is advised. The SARS-CoV-2 RNA is generally  detectable in upper and lower respiratory sp ecimens during the acute  phase of infection. The expected result is Negative. Fact Sheet for Patients:  StrictlyIdeas.no Fact Sheet for Healthcare Providers: BankingDealers.co.za This test is not yet approved or cleared by the [place] FDA and has been authorized for detection and/or diagnosis of SARS-CoV-2 by FDA under an Emergency Use Authorization (EUA).  This EUA will remain in effect (meaning this test can be used) for the duration of the COVID-19 declaration  under Section 564(b)(1) of the Act, 21 U.S.C. section 360bbb-3(b)(1), unless the authorization is terminated or revoked sooner. Performed at [facility], [place] [address] [place], [place] [address]    Ct Head Wo Contrast  Result Date: 01/24/2019 CLINICAL DATA:  Altered level of consciousness today. Status post fall today. Initial encounter. EXAM: CT HEAD WITHOUT CONTRAST TECHNIQUE: Contiguous axial images were obtained from the base of the skull through the vertex without intravenous contrast. COMPARISON:  None. FINDINGS: Brain: No evidence of acute infarction, hemorrhage, hydrocephalus, extra-axial collection or mass lesion/mass effect. Chronic microvascular ischemic change noted. Vascular: No hyperdense vessel or unexpected calcification. Skull: Intact.  No focal lesion. Sinuses/Orbits: Status post cataract surgery.  Otherwise negative. Other: Scalp laceration over the high left parietal bone is noted. IMPRESSION: Scalp laceration without underlying fracture or acute intracranial abnormality. Chronic microvascular ischemic change. Electronically Signed   By: Inge Rise M.D.   On: 01/24/2019 10:34   Dg Chest Port 1 View  Result Date: 01/24/2019 CLINICAL DATA:  Pt unable to give history. Chart notes: EMS states pt lost her balance and fell, has multiple skin tears and bruises on her arms bilaterally. Pt has dry blood on her head. Pt fell several times yesterday per daughter EXAM: PORTABLE CHEST 1 VIEW COMPARISON:  Chest radiograph 02/15/2018, 12/05/2015 FINDINGS: Stable cardiomediastinal contours. Aortic arch calcification noted. Small ill-defined opacities at at the right lung base raise the possibility of atelectasis or possibly aspiration/early infection. The left lung is clear. No pneumothorax or large pleural effusion. No definite acute osseous finding. Osteopenia. IMPRESSION: Mild opacities at the right lung base raise concern for aspiration/early infection, less likely  atelectasis or scarring. Electronically Signed   By: Audie Pinto M.D.   On: 01/24/2019 12:55    Pending Labs Unresulted Labs (From admission, onward)    Start     Ordered   01/31/19 0500  Creatinine, serum  (enoxaparin (LOVENOX)    CrCl >/= 30 ml/min)  Weekly,   R  Comments: while on enoxaparin therapy    01/24/19 1428   01/25/19 0500  Procalcitonin  Daily,   R     01/24/19 1359   01/25/19 0500  Protime-INR  Tomorrow morning,   R     01/24/19 1428   01/25/19 0500  Cortisol-am, blood  Tomorrow morning,   R     01/24/19 1428   01/25/19 0500  Procalcitonin  Tomorrow morning,   R     01/24/19 1428   01/25/19 0500  Basic metabolic panel  Tomorrow morning,   R     01/24/19 1428   01/25/19 0500  CBC  Tomorrow morning,   R     01/24/19 1428   01/24/19 1439  Differential  Once,   STAT     01/24/19 1438   01/24/19 1434  CK  Once,   STAT     01/24/19 1433   01/24/19 1429  Culture, Urine  Once,   STAT     01/24/19 1429   01/24/19 1429  Culture, blood (routine x 2)  BLOOD CULTURE X 2,   R (with STAT occurrences)     01/24/19 1429   01/24/19 1427  TSH  Once,   STAT     01/24/19 1428   01/24/19 1413  CBC  (enoxaparin (LOVENOX)    CrCl >/= 30 ml/min)  Once,   STAT    Comments: Baseline for enoxaparin therapy IF NOT ALREADY DRAWN.  Notify MD if PLT < 100 K.    01/24/19 1428   01/24/19 1413  Creatinine, serum  (enoxaparin (LOVENOX)    CrCl >/= 30 ml/min)  Once,   STAT    Comments: Baseline for enoxaparin therapy IF NOT ALREADY DRAWN.    01/24/19 1428   01/24/19 1401  D-dimer, quantitative (not at [facility])  ONCE - STAT,   STAT     01/24/19 1400   01/24/19 1400  Procalcitonin - Baseline  ONCE - STAT,   STAT     01/24/19 1359          Vitals/Pain Today's Vitals   01/24/19 1300 01/24/19 1330 01/24/19 1430 01/24/19 1553  BP: 120/88 (!) 119/96 115/86 104/66  Pulse: (!) 138 (!) 121 (!) 137 (!) 120  Resp: (!) 26 (!) 25 (!) 32 20  Temp:      TempSrc:      SpO2: 98% 100% 100% 97%   Weight:      Height:      PainSc:        Isolation Precautions Airborne and Contact precautions  Medications Medications  lidocaine (PF) (XYLOCAINE) 1 % injection (has no administration in time range)  cefTRIAXone (ROCEPHIN) 2 g in sodium chloride 0.9 % 100 mL IVPB (0 g Intravenous Stopped 01/24/19 1455)  azithromycin (ZITHROMAX) 500 mg in sodium chloride 0.9 % 250 mL IVPB (500 mg Intravenous New Bag/Given 01/24/19 1541)  sodium chloride 0.9 % bolus 500 mL (has no administration in time range)  enoxaparin (LOVENOX) injection 40 mg (has no administration in time range)  sodium chloride 0.9 % bolus 1,000 mL (has no administration in time range)  Tdap (BOOSTRIX) injection 0.5 mL (0.5 mLs Intramuscular Given 01/24/19 1105)  bacitracin ointment (1 application Topical Given 01/24/19 1107)  lidocaine (PF) (XYLOCAINE) 1 % injection 10 mL (10 mLs Intradermal Given 01/24/19 1201)  diltiazem (CARDIZEM) 1 mg/mL load via infusion 20 mg (20 mg Intravenous Bolus from Bag 01/24/19 1303)  sodium chloride 0.9 % bolus 500 mL (500 mLs Intravenous New  Bag/Given 01/24/19 1431)  acetaminophen (TYLENOL) tablet 1,000 mg (1,000 mg Oral Given 01/24/19 1432)    Mobility walks with person assist

## 2019-01-24 NOTE — ED Notes (Signed)
Pure wick has been placed. Suction set to 45mmHg.  

## 2019-01-24 NOTE — ED Triage Notes (Signed)
Pt A&O per baseline according to EMS, pt does not know time. EMS states pt lost her balance and fell, has multiple skin tears and bruises on her arms bilaterally. Pt has dry blood on her head. Pt fell several times yesterday per daughter.  BP 187/88 HR 70 RR 18 SpO2 94 on RA Temp 98.2

## 2019-01-24 NOTE — Progress Notes (Signed)
Will proceed with CTA chest stat to exclude PE although d-dimer elevation could be just due to sepsis. Won't start heparin yet. Tachycardia and tachypnea improving. Soft blood pressures but improved. Metoprolol with parameters. IV NS bolus ordered earlier.

## 2019-01-24 NOTE — H&P (Signed)
History and Physical    Jenna Carrillo ZOX:[id number] DOB: 12-Mar-1931 DOA: 01/24/2019  PCP: Creola Cornusso, John, MD Patient coming from: Home.  Patient has caregiver at home.  Chief Complaint: Fall  HPI: Jenna Carrillo is a 85 y.o. female with history of COPD not on oxygen, HTN, HLD, hypothyroidism, polymyalgia rheumatica and possibly CKD-3 after mechanical fall episode at home.  Patient appears confused and slightly agitated fighting with the pulse ox wire.  However, she follows some command.  History obtained from patient's daughter, Ms. Weeks over the phone.   Per patient's daughter, patient lives with her son who is bedridden from MS.  She was found wedged between the toilet and wall yesterday morning about 8 AM when the caregiver went into check on her.  It is unclear how long she has been on ground.  She may have fallen at 6 AM per his son's report to Ms. Weeks. They called EMS.  She only had a little bit of bruise on her elbow.  She refused to come to the hospital.  Patient had another fall about 8 AM this morning while trying to change into pair of pants.  She fell back and hit her head on the corner of the dresser.  Has some bleeding.  No loss of consciousness.  EMS called and brought to ED.  Patient's daughter denies fever, cold symptoms, nausea, vomiting, facial droop, focal weakness or speech change.  She has chronic cough which is at baseline.  Patient was recently treated for UTI by PCP and symptoms resolved after treatment.  Per daughter, mental status is basically appropriate for age except for occasionally spacing out at baseline.  She is from a smoker.  Denies alcohol recreational drug use.  Daughter thinks patient needs 24/7 and is planning to move her in.  In ED, vital signs within normal range on arrival except for mild temperature to 99.3.  Then she became tachycardic to 130s and 140s and tachypneic to 20s and 30s.  Blood pressure stable.  Saturating at 100% on 2 L.  CMP not  impressive.  CBC with leukocytosis.  Differential not obtained.  UA with 5 ketones, 30 protein and rare bacteria.  CT head without acute intracranial finding or skull fracture but hematoma over.  Left skull posteriorly.  Portable CXR with mild opacities over RLL raising concern for aspiration/early infection.  EKG sinus tachycardia on my review.  Per EDP, patient had flutter on telemetry and started on Cardizem drip.  She was also started on azithromycin and ceftriaxone, and hospital service was called for admission.  I have requested EDP to order a rapid COVID test to exclude COVID-19.  ROS All review of system negative except for pertinent positives and negatives as history of present illness above Review of system obtained from patient's daughter.  PMH Past Medical History:  Diagnosis Date  . Bronchiectasis   . COPD (chronic obstructive pulmonary disease) (HCC)   . Degenerative disk disease   . Eczema   . Gastric ulcer   . GERD (gastroesophageal reflux disease)   . Hiatal hernia   . Hyperlipidemia   . Hypertension   . Hypothyroidism   . Osteoarthritis   . Polymyalgia rheumatica (HCC)   . Pulmonary nodule, right   . Vertebrobasilar insufficiency    PSH Past Surgical History:  Procedure Laterality Date  . BREAST CYST EXCISION     right  . broken tail bone    . CAROTID ENDARTERECTOMY     right  .  RHINOPLASTY    . TOTAL ABDOMINAL HYSTERECTOMY     Fam HX Family History  Problem Relation Age of Onset  . COPD Mother   . Breast cancer Mother   . Emphysema Mother   . Liver disease Son   . Emphysema Father   . Heart attack Father     Social Hx  reports that she quit smoking about 60 years ago. Her smoking use included cigarettes. She has a 4.00 pack-year smoking history. She has never used smokeless tobacco. She reports that she does not drink alcohol or use drugs.  Allergy Allergies  Allergen Reactions  . Sulfonamide Derivatives    Home Meds Prior to Admission  medications   Medication Sig Start Date End Date Taking? Authorizing Provider  amitriptyline (ELAVIL) 50 MG tablet Take 50 mg by mouth at bedtime.   Yes [provider]  benzonatate (TESSALON) 200 MG capsule Take 1 capsule (200 mg total) by mouth 3 (three) times daily as needed for cough. 12/20/18  Yes Fenton Foy, NP  levothyroxine (SYNTHROID, LEVOTHROID) 88 MCG tablet Take 88 mcg by mouth daily before breakfast.   Yes [provider]  metoprolol (TOPROL-XL) 50 MG 24 hr tablet Take 50 mg by mouth 2 (two) times daily.    Yes [provider]  Multiple Vitamin (MULTIVITAMIN) tablet Take 1 tablet by mouth daily.     Yes [provider]  nitrofurantoin, macrocrystal-monohydrate, (MACROBID) 100 MG capsule Take 100 mg by mouth 2 (two) times daily. 01/17/19  Yes [provider]  omeprazole (PRILOSEC) 20 MG capsule Take 20 mg by mouth daily as needed (acid reflux).    Yes [provider]  predniSONE (DELTASONE) 1 MG tablet Take 3 mg by mouth daily.    Yes [provider]  PROAIR HFA 108 (90 Base) MCG/ACT inhaler INHALE 2 PUFFS BY MOUTH EVERY 4 HOURS AS NEEDED Patient taking differently: Inhale 2 puffs into the lungs every 4 (four) hours as needed for wheezing or shortness of breath.  12/08/18  Yes Chesley Mires, MD  SPIRIVA HANDIHALER 18 MCG inhalation capsule PLACE 1 CAPSULE (18 MCG TOTAL) INTO INHALER AND INHALE DAILY Patient taking differently: Place 18 mcg into inhaler and inhale daily.  01/07/19  Yes Chesley Mires, MD    Physical Exam: Vitals:   01/24/19 1230 01/24/19 1245 01/24/19 1300 01/24/19 1330  BP: 120/90  120/88 (!) 119/96  Pulse: (!) 131 (!) 134 (!) 138 (!) 121  Resp: (!) 28 19 (!) 26 (!) 25  Temp:      TempSrc:      SpO2: 100% 100% 98% 100%  Weight:      Height:        GENERAL: Appears confused and slightly agitated fighting with a pulse ox wire. HEENT: MMM.  Vision and hearing grossly intact.  NECK: Supple.  No  apparent JVD.  RESP:  No IWOB. Good air movement bilaterally.  Crackles over RLL. CVS: Tachycardic to 130s.  Regular rhythm.. Heart sounds normal.  ABD/GI/GU: Bowel sounds present. Soft. Non tender.  MSK/EXT:  Moves extremities. No apparent deformity or edema.  SKIN: Small laceration over left occipital parietal region-repaired, and forearms bilaterally without active bleed. NEURO: Awake, alert and but confused.  She follows some command but does not respond to questions.  PERRL.  No obvious facial droop.  Grip strength symmetric.  Moves all extremities.  Patellar reflex symmetric.  Further exam limited due to patient's inability to cooperate. PSYCH: Somewhat confused and slightly agitated.  Personally Reviewed Radiological Exams Ct Head Wo Contrast  Result Date: 01/24/2019 CLINICAL DATA:  Altered level of consciousness today. Status post fall today. Initial encounter. EXAM: CT HEAD WITHOUT CONTRAST TECHNIQUE: Contiguous axial images were obtained from the base of the skull through the vertex without intravenous contrast. COMPARISON:  None. FINDINGS: Brain: No evidence of acute infarction, hemorrhage, hydrocephalus, extra-axial collection or mass lesion/mass effect. Chronic microvascular ischemic change noted. Vascular: No hyperdense vessel or unexpected calcification. Skull: Intact.  No focal lesion. Sinuses/Orbits: Status post cataract surgery.  Otherwise negative. Other: Scalp laceration over the high left parietal bone is noted. IMPRESSION: Scalp laceration without underlying fracture or acute intracranial abnormality. Chronic microvascular ischemic change. Electronically Signed   By: Drusilla Kanner M.D.   On: 01/24/2019 10:34   Dg Chest Port 1 View  Result Date: 01/24/2019 CLINICAL DATA:  Pt unable to give history. Chart notes: EMS states pt lost her balance and fell, has multiple skin tears and bruises on her arms bilaterally. Pt has dry blood on her head. Pt fell several times yesterday per  daughter EXAM: PORTABLE CHEST 1 VIEW COMPARISON:  Chest radiograph 02/15/2018, 12/05/2015 FINDINGS: Stable cardiomediastinal contours. Aortic arch calcification noted. Small ill-defined opacities at at the right lung base raise the possibility of atelectasis or possibly aspiration/early infection. The left lung is clear. No pneumothorax or large pleural effusion. No definite acute osseous finding. Osteopenia. IMPRESSION: Mild opacities at the right lung base raise concern for aspiration/early infection, less likely atelectasis or scarring. Electronically Signed   By: Emmaline Kluver M.D.   On: 01/24/2019 12:55     Personally Reviewed Labs: CBC: Recent Labs  Lab 01/24/19 1105  WBC 21.2*  HGB 12.2  HCT 39.0  MCV 96.3  PLT 275   Basic Metabolic Panel: Recent Labs  Lab 01/24/19 1105  NA 137  K 4.0  CL 98  CO2 28  GLUCOSE 116*  BUN 18  CREATININE 0.96  CALCIUM 8.8*   GFR: Estimated Creatinine Clearance: 39.9 mL/min (by C-G formula based on SCr of 0.96 mg/dL). Liver Function Tests: Recent Labs  Lab 01/24/19 1105  AST 30  ALT 19  ALKPHOS 98  BILITOT 0.7  PROT 6.8  ALBUMIN 3.3*   No results for input(s): LIPASE, AMYLASE in the last 168 hours. No results for input(s): AMMONIA in the last 168 hours. Coagulation Profile: No results for input(s): INR, PROTIME in the last 168 hours. Cardiac Enzymes: No results for input(s): CKTOTAL, CKMB, CKMBINDEX, TROPONINI in the last 168 hours. BNP (last 3 results) No results for input(s): PROBNP in the last 8760 hours. HbA1C: No results for input(s): HGBA1C in the last 72 hours. CBG: No results for input(s): GLUCAP in the last 168 hours. Lipid Profile: No results for input(s): CHOL, HDL, LDLCALC, TRIG, CHOLHDL, LDLDIRECT in the last 72 hours. Thyroid Function Tests: No results for input(s): TSH, T4TOTAL, FREET4, T3FREE, THYROIDAB in the last 72 hours. Anemia Panel: No results for input(s): VITAMINB12, FOLATE, FERRITIN, TIBC, IRON,  RETICCTPCT in the last 72 hours. Urine analysis:    Component Value Date/Time   COLORURINE YELLOW 01/24/2019 0921   APPEARANCEUR CLEAR 01/24/2019 0921   LABSPEC 1.017 01/24/2019 0921   PHURINE 5.0 01/24/2019 0921   GLUCOSEU NEGATIVE 01/24/2019 0921   HGBUR SMALL (A) 01/24/2019 0921   BILIRUBINUR NEGATIVE 01/24/2019 0921   KETONESUR 5 (A) 01/24/2019 0921   PROTEINUR 30 (A) 01/24/2019 0921   NITRITE NEGATIVE 01/24/2019 0921   LEUKOCYTESUR NEGATIVE 01/24/2019 0921    Sepsis  Labs:  Lactic acid negative.  Personally Reviewed EKG:  EKG sinus tachycardia with no acute ischemic finding.  Assessment/Plan Sepsis likely due to pneumonia: she has SIRS, mental status change and CXR finding concerning for RLL pneumonia.  Tachycardia and could be due to agitation.  She is febrile to 99.3 (geriatric) and leukocytosis to 21.  Patient is immunocompromise due to chronic steroid. -Continue ceftriaxone and azithromycin -Consider adding Flagyl for anaerobic coverage if no improvement -Continue home prednisone.  Will increase to stress dose if signs of decompensation. -Check procalcitonin -Follow COVID-19 test.  Fall at home: seems mechanical from patient's daughter description.  Lives with a son who is bedridden from MS. -Fall precaution -PT/OT eval  Tachycardia: reportedly had flutter on telemetry per EDP and started on Cardizem drip.  I suspect tachycardia to be due to agitation.  EKG reveals sinus tachycardia on my review. -We will give IV normal saline bolus -Discontinue Cardizem drip and resume home metoprolol with PRN p.o. Cardizem -Telemetry monitoring.  Acute metabolic encephalopathy: this is likely a combination possible infectious process and delirium.  CT head without acute or cranial finding or skull fracture.  She has no focal neuro deficit other than confusion and agitation on exam although her exam is limited due to inability to cooperate -Delirium and fall precautions. -Treat  treatable causes.  Chronic COPD not on oxygen at home. -Xopenex and Atrovent every 4 hours as needed -Budesonide and Anoro Ellipta -Continue home prednisone  Hypertension: Normotensive -Resume home metoprolol  Polymyalgia rheumatica -Continue low-dose prednisone  Hypothyroidism -Continue home Synthroid  Depression/insomnia -Continue home amitriptyline  DVT prophylaxis: Subcu Lovenox  Code Status: DNR per patient's daughter, Ms. weeks Family Communication: Updated patient's daughter over the phone Disposition Plan: Medical telemetry Consults called: None Admission status: Inpatient  Severity of Illness: The appropriate patient status for this patient is INPATIENT. Inpatient status is judged to be reasonable and necessary in order to provide the required intensity of service to ensure the patient's safety. The patient's presenting symptoms, physical exam findings, and initial radiographic and laboratory data in the context of their chronic comorbidities is felt to place them at high risk for further clinical deterioration. Furthermore, it is not anticipated that the patient will be medically stable for discharge from the hospital within 2 midnights of admission. The following factors support the patient status of inpatient.    "           The patient's presenting symptoms include altered mental status and fall "           The worrisome physical exam findings include tachycardia, tachypnea, crackles over lung, altered mental status, confusion and agitation "           The initial radiographic and laboratory data are worrisome because leukocytosis, "           The chronic co-morbidities include COPD, hypertension, polymyalgia rheumatica, hypothyroidism, immunocompromise due to chronic steroid.     I certify that at the point of admission it is my clinical judgment that the patient will require inpatient hospital care spanning beyond 2 midnights from the point of admission due to high  intensity of service, high risk for further deterioration and high frequency of surveillance required. Almon Hercules MD [facility]  If 7PM-7AM, please contact night-coverage [email or web address] Password [email or web address]  01/24/2019, 2:30 PM

## 2019-01-24 NOTE — Progress Notes (Addendum)
RN paged because BP is still soft after an estimated 2 liters of boluses. Another 1L bolus was ordered. 1/2 way through that bolus, RN paged with BP of 59/23. NP was on unit and went to see the pt.  S: pt denies any pain or any other issues. Per RN, she had a BM and urinated on this shift.  O: Awake, alert. Afebrile. HR 82. RR 17. SaO2 98%. Not oriented, but speaks in full sentences. She is in no distress. S1S2, RRR. Lungs with good air exchange. Abd is soft, NT to palpation. Extremities are warm and dry. Skin is without pallor or cyanosis.  A/P: 1. Hypotension-s/p 2.5 Liters now and BP is up to the 80s. Will finish this bolus and given another liter and follow BP. We switched BP cuff to other arm and it is reading same. She has a dx of sepsis. Recheck BMP to check bicarb and a LA to see if it is rising.  She is a DNR.  KJKG, NP [facility] Update: BMP and LA unremarkable. BP coming up with boluses. KJKG, NP [facility]

## 2019-01-24 NOTE — ED Provider Notes (Addendum)
[facility]-EMERGENCY DEPT Provider Note   CSN: [id number] Arrival date & time: 01/24/19  0909     History   Chief Complaint Chief Complaint  Patient presents with  . Fall    HPI Jenna Doverlaine P Slivka is a 85 y.o. female.     Patient s/p falls at home in past 2 days. Had fall yesterday, EMS did not transport. This AM, mechanical fall, skin tear to left arm, contusion to scalp. Patient normally walks w walker. Patient limited historian, confusion - ?dementia-  Level 5 caveat. No report of fevers. Pt denies any pain or other specific complaint, but is a very limited historian. Tetanus unknown.   The history is provided by the patient and the EMS personnel. The history is limited by the condition of the patient.  Fall Pertinent negatives include no chest pain, no abdominal pain, no headaches and no shortness of breath.    Past Medical History:  Diagnosis Date  . Bronchiectasis   . COPD (chronic obstructive pulmonary disease) (HCC)   . Degenerative disk disease   . Eczema   . Gastric ulcer   . GERD (gastroesophageal reflux disease)   . Hiatal hernia   . Hyperlipidemia   . Hypertension   . Hypothyroidism   . Osteoarthritis   . Polymyalgia rheumatica (HCC)   . Pulmonary nodule, right   . Vertebrobasilar insufficiency     Patient Active Problem List   Diagnosis Date Noted  . Bronchiectasis with (acute) exacerbation (HCC) 12/20/2018  . Lobar pneumonia, unspecified organism (HCC) 02/15/2018  . Nuclear sclerosis of both eyes 03/05/2016  . Glaucoma suspect, bilateral 03/05/2016  . CAP (community acquired pneumonia) 11/12/2015  . Pulmonary nodules 10/12/2015  . Cough 02/03/2014  . Macular degeneration, left eye 12/28/2013  . Glaucoma suspect, both eyes 12/28/2013  . Allergic rhinitis 11/12/2012  . Nuclear cataract 08/08/2011  . BRONCHIECTASIS 03/09/2009  . COPD 03/09/2009  . Polymyalgia rheumatica (HCC) 01/31/2009    Past Surgical History:  Procedure  Laterality Date  . BREAST CYST EXCISION     right  . broken tail bone    . CAROTID ENDARTERECTOMY     right  . RHINOPLASTY    . TOTAL ABDOMINAL HYSTERECTOMY       OB History   No obstetric history on file.      Home Medications    Prior to Admission medications   Medication Sig Start Date End Date Taking? Authorizing Provider  amitriptyline (ELAVIL) 50 MG tablet Take 50 mg by mouth at bedtime.    [provider]  benzonatate (TESSALON) 200 MG capsule Take 1 capsule (200 mg total) by mouth 3 (three) times daily as needed for cough. 12/20/18   Ivonne AndrewNichols, Tonya S, NP  doxycycline (VIBRA-TABS) 100 MG tablet Take 1 tablet (100 mg total) by mouth 2 (two) times daily. 12/20/18   Ivonne AndrewNichols, Tonya S, NP  levothyroxine (SYNTHROID, LEVOTHROID) 88 MCG tablet Take 88 mcg by mouth daily before breakfast.    [provider]  metoprolol (TOPROL-XL) 50 MG 24 hr tablet Take 50 mg by mouth 2 (two) times daily.     [provider]  Multiple Vitamin (MULTIVITAMIN) tablet Take 1 tablet by mouth daily.      [provider]  omeprazole (PRILOSEC) 20 MG capsule Take 20 mg by mouth daily as needed.      [provider]  predniSONE (DELTASONE) 1 MG tablet 3 tablets a day    [provider]  PROAIR HFA 108 (  90 Base) MCG/ACT inhaler INHALE 2 PUFFS BY MOUTH EVERY 4 HOURS AS NEEDED 12/08/18   [name], MD  SPIRIVA HANDIHALER 18 MCG inhalation capsule PLACE 1 CAPSULE (18 MCG TOTAL) INTO INHALER AND INHALE DAILY 01/07/19   [name], MD    Family History Family History  Problem Relation Age of Onset  . COPD Mother   . Breast cancer Mother   . Emphysema Mother   . Liver disease Son   . Emphysema Father   . Heart attack Father     Social History Social History   Tobacco Use  . Smoking status: Former Smoker    Packs/day: 2.00    Years: 2.00    Pack years: 4.00    Types: Cigarettes    Quit date: 05/19/1958    Years since quitting: 60.7  . Smokeless  tobacco: Never Used  Substance Use Topics  . Alcohol use: No  . Drug use: Never     Allergies   Sulfonamide derivatives   Review of Systems Review of Systems  Constitutional: Negative for fever.  HENT: Negative for nosebleeds.   Eyes: Negative for pain.  Respiratory: Negative for shortness of breath.   Cardiovascular: Negative for chest pain.  Gastrointestinal: Negative for abdominal pain and vomiting.  Genitourinary: Negative for flank pain.  Musculoskeletal: Negative for back pain and neck pain.  Skin: Positive for wound.  Neurological: Negative for headaches.  Hematological: Does not bruise/bleed easily.  Psychiatric/Behavioral: Positive for confusion.     Physical Exam Updated Vital Signs There were no vitals taken for this visit.  Physical Exam Vitals signs and nursing note reviewed.  Constitutional:      Appearance: Normal appearance. She is well-developed.  HENT:     Head:     Comments: Contusion, with 1.5 cm lac to left posterior scalp. No fb felt. No active bleeding.     Nose: Nose normal.     Mouth/Throat:     Mouth: Mucous membranes are moist.  Eyes:     General: No scleral icterus.    Conjunctiva/sclera: Conjunctivae normal.     Pupils: Pupils are equal, round, and reactive to light.  Neck:     Musculoskeletal: Normal range of motion and neck supple. No neck rigidity or muscular tenderness.     Vascular: No carotid bruit.     Trachea: No tracheal deviation.  Cardiovascular:     Rate and Rhythm: Normal rate and regular rhythm.     Pulses: Normal pulses.     Heart sounds: Normal heart sounds. No murmur. No friction rub. No gallop.   Pulmonary:     Effort: Pulmonary effort is normal. No respiratory distress.     Breath sounds: Normal breath sounds.  Abdominal:     General: Bowel sounds are normal. There is no distension.     Palpations: Abdomen is soft.     Tenderness: There is no abdominal tenderness. There is no guarding.  Genitourinary:     Comments: No cva tenderness.  Musculoskeletal:        General: No swelling or tenderness.     Comments: CTLS spine, non tender, aligned, no step off. Good rom bil ext without pain or focal bony tenderness. Distal pulses palp bil. Skin tear to proximal left forearm, and smaller skin tear to right forearm.   Skin:    General: Skin is warm and dry.     Findings: No rash.  Neurological:     Mental Status: She is alert.  Comments: Alert, speech normal. Moves bil ext purposefully with good strength.   Psychiatric:        Mood and Affect: Mood normal.      ED Treatments / Results  Labs (all labs ordered are listed, but only abnormal results are displayed) Results for orders placed or performed during the hospital encounter of 01/24/19  CBC  Result Value Ref Range   WBC 21.2 (H) 4.0 - 10.5 K/uL   RBC 4.05 3.87 - 5.11 MIL/uL   Hemoglobin 12.2 12.0 - 15.0 g/dL   HCT 01.0 27.2 - 53.6 %   MCV 96.3 80.0 - 100.0 fL   MCH 30.1 26.0 - 34.0 pg   MCHC 31.3 30.0 - 36.0 g/dL   RDW 64.4 03.4 - 74.2 %   Platelets 275 150 - 400 K/uL   nRBC 0.0 0.0 - 0.2 %  Comprehensive metabolic panel  Result Value Ref Range   Sodium 137 135 - 145 mmol/L   Potassium 4.0 3.5 - 5.1 mmol/L   Chloride 98 98 - 111 mmol/L   CO2 28 22 - 32 mmol/L   Glucose, Bld 116 (H) 70 - 99 mg/dL   BUN 18 8 - 23 mg/dL   Creatinine, Ser 5.95 0.44 - 1.00 mg/dL   Calcium 8.8 (L) 8.9 - 10.3 mg/dL   Total Protein 6.8 6.5 - 8.1 g/dL   Albumin 3.3 (L) 3.5 - 5.0 g/dL   AST 30 15 - 41 U/L   ALT 19 0 - 44 U/L   Alkaline Phosphatase 98 38 - 126 U/L   Total Bilirubin 0.7 0.3 - 1.2 mg/dL   GFR calc non Af Amer 54 (L) >60 mL/min   GFR calc Af Amer >60 >60 mL/min   Anion gap 11 5 - 15  Urinalysis, Routine w reflex microscopic  Result Value Ref Range   Color, Urine YELLOW YELLOW   APPearance CLEAR CLEAR   Specific Gravity, Urine 1.017 1.005 - 1.030   pH 5.0 5.0 - 8.0   Glucose, UA NEGATIVE NEGATIVE mg/dL   Hgb urine dipstick  SMALL (A) NEGATIVE   Bilirubin Urine NEGATIVE NEGATIVE   Ketones, ur 5 (A) NEGATIVE mg/dL   Protein, ur 30 (A) NEGATIVE mg/dL   Nitrite NEGATIVE NEGATIVE   Leukocytes,Ua NEGATIVE NEGATIVE   RBC / HPF 0-5 0 - 5 RBC/hpf   WBC, UA 0-5 0 - 5 WBC/hpf   Bacteria, UA RARE (A) NONE SEEN   Squamous Epithelial / LPF 0-5 0 - 5   Mucus PRESENT    Hyaline Casts, UA PRESENT    Ct Head Wo Contrast  Result Date: 01/24/2019 CLINICAL DATA:  Altered level of consciousness today. Status post fall today. Initial encounter. EXAM: CT HEAD WITHOUT CONTRAST TECHNIQUE: Contiguous axial images were obtained from the base of the skull through the vertex without intravenous contrast. COMPARISON:  None. FINDINGS: Brain: No evidence of acute infarction, hemorrhage, hydrocephalus, extra-axial collection or mass lesion/mass effect. Chronic microvascular ischemic change noted. Vascular: No hyperdense vessel or unexpected calcification. Skull: Intact.  No focal lesion. Sinuses/Orbits: Status post cataract surgery.  Otherwise negative. Other: Scalp laceration over the high left parietal bone is noted. IMPRESSION: Scalp laceration without underlying fracture or acute intracranial abnormality. Chronic microvascular ischemic change. Electronically Signed   By: Drusilla Kanner M.D.   On: 01/24/2019 10:34    EKG EKG Interpretation  Date/Time:  Monday January 24 2019 11:59:47 EDT Ventricular Rate:  147 PR Interval:    QRS Duration: 79 QT Interval:  290 QTC Calculation: 454 R Axis:   41 Text Interpretation:  Narrow QRS tachycardia No previous tracing Confirmed by Lajean Saver [phone]) on 01/24/2019 12:17:07 PM   Radiology Ct Head Wo Contrast  Result Date: 01/24/2019 CLINICAL DATA:  Altered level of consciousness today. Status post fall today. Initial encounter. EXAM: CT HEAD WITHOUT CONTRAST TECHNIQUE: Contiguous axial images were obtained from the base of the skull through the vertex without intravenous contrast. COMPARISON:   None. FINDINGS: Brain: No evidence of acute infarction, hemorrhage, hydrocephalus, extra-axial collection or mass lesion/mass effect. Chronic microvascular ischemic change noted. Vascular: No hyperdense vessel or unexpected calcification. Skull: Intact.  No focal lesion. Sinuses/Orbits: Status post cataract surgery.  Otherwise negative. Other: Scalp laceration over the high left parietal bone is noted. IMPRESSION: Scalp laceration without underlying fracture or acute intracranial abnormality. Chronic microvascular ischemic change. Electronically Signed   By: Inge Rise M.D.   On: 01/24/2019 10:34   Dg Chest Port 1 View  Result Date: 01/24/2019 CLINICAL DATA:  Pt unable to give history. Chart notes: EMS states pt lost her balance and fell, has multiple skin tears and bruises on her arms bilaterally. Pt has dry blood on her head. Pt fell several times yesterday per daughter EXAM: PORTABLE CHEST 1 VIEW COMPARISON:  Chest radiograph 02/15/2018, 12/05/2015 FINDINGS: Stable cardiomediastinal contours. Aortic arch calcification noted. Small ill-defined opacities at at the right lung base raise the possibility of atelectasis or possibly aspiration/early infection. The left lung is clear. No pneumothorax or large pleural effusion. No definite acute osseous finding. Osteopenia. IMPRESSION: Mild opacities at the right lung base raise concern for aspiration/early infection, less likely atelectasis or scarring. Electronically Signed   By: Audie Pinto M.D.   On: 01/24/2019 12:55    Procedures .Marland KitchenLaceration Repair  Date/Time: 01/24/2019 12:01 PM Performed by: Lajean Saver, MD Authorized by: Lajean Saver, MD   Anesthesia (see MAR for exact dosages):    Anesthesia method:  Local infiltration   Local anesthetic:  Lidocaine 2% WITH epi Laceration details:    Location:  Scalp   Scalp location:  Occipital   Length (cm):  1.5 Repair type:    Repair type:  Simple Pre-procedure details:    Preparation:   Patient was prepped and draped in usual sterile fashion Exploration:    Contaminated: no   Treatment:    Area cleansed with:  Betadine   Amount of cleaning:  Standard   Irrigation solution:  Sterile saline   Visualized foreign bodies/material removed: no   Skin repair:    Repair method:  Sutures   Suture size:  4-0   Suture material:  Prolene   Suture technique:  Simple interrupted   Number of sutures:  3 Post-procedure details:    Dressing:  Antibiotic ointment   Patient tolerance of procedure:  Tolerated well, no immediate complications   (including critical care time)  Medications Ordered in ED Medications  Tdap (BOOSTRIX) injection 0.5 mL (has no administration in time range)  bacitracin ointment (has no administration in time range)     Initial Impression / Assessment and Plan / ED Course  I have reviewed the triage vital signs and the nursing notes.  Pertinent labs & imaging results that were available during my care of the patient were reviewed by me and considered in my medical decision making (see chart for details).  Iv ns. Stat labs and imaging.  Wounds cleaned, bacitracin and non adherent dressing applied.   Tetanus im.   Reviewed nursing notes and  prior charts for additional history.   Recheck pt noted to be newly tachycardic. Pt is in no apparent pain or discomfort. Denies chest pain or sob. Will get ecg. Check rectal temp.   ?aflutter w 2:1 block on ecg, as hr jumped abruptly from 80 to 150 and is staying at 150. cardizem bolus/gtt. Ivf.  On recheck, underlying rhythm appears sinus tachycardia, cardizem d/c'd.   Wound sutured. Bacitracin applied to sutured area.   Labs reviewed by me - wbc is elevated. ua pending. Will I and O cath for UA.   UA neg for infection.  CXR reviewed by me - possible pna.   Iv abx.   Given multiple falls, weakness, leucocytosis, suspected pna, will admit.   Discussed pt with hospitalists - they request rapid covid  screening be done - added.  Jenna Carrillo was evaluated in Emergency Department on 01/24/2019 for the symptoms described in the history of present illness. She was evaluated in the context of the global COVID-19 pandemic, which necessitated consideration that the patient might be at risk for infection with the SARS-CoV-2 virus that causes COVID-19. Institutional protocols and algorithms that pertain to the evaluation of patients at risk for COVID-19 are in a state of rapid change based on information released by regulatory bodies including the CDC and federal and state organizations. These policies and algorithms were followed during the patient's care in the ED.     Final Clinical Impressions(s) / ED Diagnoses   Final diagnoses:  None    ED Discharge Orders    None             Cathren LaineSteinl, Jomayra Novitsky, MD 01/24/19 [phone]

## 2019-01-25 ENCOUNTER — Inpatient Hospital Stay: Payer: Self-pay

## 2019-01-25 DIAGNOSIS — J189 Pneumonia, unspecified organism: Secondary | ICD-10-CM

## 2019-01-25 DIAGNOSIS — Z7952 Long term (current) use of systemic steroids: Secondary | ICD-10-CM

## 2019-01-25 DIAGNOSIS — W010XXA Fall on same level from slipping, tripping and stumbling without subsequent striking against object, initial encounter: Secondary | ICD-10-CM

## 2019-01-25 DIAGNOSIS — R531 Weakness: Secondary | ICD-10-CM

## 2019-01-25 DIAGNOSIS — G9340 Encephalopathy, unspecified: Secondary | ICD-10-CM | POA: Diagnosis present

## 2019-01-25 DIAGNOSIS — J439 Emphysema, unspecified: Secondary | ICD-10-CM

## 2019-01-25 DIAGNOSIS — M353 Polymyalgia rheumatica: Secondary | ICD-10-CM

## 2019-01-25 LAB — BASIC METABOLIC PANEL
Anion gap: 5 (ref 5–15)
BUN: 12 mg/dL (ref 8–23)
CO2: 23 mmol/L (ref 22–32)
Calcium: 7.7 mg/dL — ABNORMAL LOW (ref 8.9–10.3)
Chloride: 111 mmol/L (ref 98–111)
Creatinine, Ser: 0.71 mg/dL (ref 0.44–1.00)
GFR calc Af Amer: >60 mL/min (ref >60)
GFR calc non Af Amer: >60 mL/min (ref >60)
Glucose, Bld: 114 mg/dL — ABNORMAL HIGH (ref 70–99)
Potassium: 3.8 mmol/L (ref 3.5–5.1)
Sodium: 139 mmol/L (ref 135–145)

## 2019-01-25 LAB — URINE CULTURE: Culture: NO GROWTH

## 2019-01-25 LAB — CBC
HCT: 31.4 % — ABNORMAL LOW (ref 36.0–46.0)
Hemoglobin: 9.5 g/dL — ABNORMAL LOW (ref 12.0–15.0)
MCH: 30.2 pg (ref 26.0–34.0)
MCHC: 30.3 g/dL (ref 30.0–36.0)
MCV: 99.7 fL (ref 80.0–100.0)
Platelets: 215 10*3/uL (ref 150–400)
RBC: 3.15 MIL/uL — ABNORMAL LOW (ref 3.87–5.11)
RDW: 14.2 % (ref 11.5–15.5)
WBC: 16.2 10*3/uL — ABNORMAL HIGH (ref 4.0–10.5)
nRBC: 0 % (ref 0.0–0.2)

## 2019-01-25 LAB — CORTISOL-AM, BLOOD: Cortisol - AM: 7.1 ug/dL (ref 6.7–22.6)

## 2019-01-25 LAB — PROTIME-INR
INR: 1.6 — ABNORMAL HIGH (ref 0.8–1.2)
Prothrombin Time: 18.5 seconds — ABNORMAL HIGH (ref 11.4–15.2)

## 2019-01-25 LAB — PROCALCITONIN: Procalcitonin: 4.71 ng/mL

## 2019-01-25 MED ORDER — TRAZODONE HCL 50 MG PO TABS
25.0000 mg | ORAL_TABLET | Freq: Once | ORAL | Status: AC
  Administered 2019-01-26: 25 mg via ORAL
  Filled 2019-01-25: qty 1

## 2019-01-25 MED ORDER — SODIUM CHLORIDE 0.45 % IV SOLN
INTRAVENOUS | Status: DC
  Administered 2019-01-25: 13:00:00 via INTRAVENOUS

## 2019-01-25 MED ORDER — INFLUENZA VAC A&B SA ADJ QUAD 0.5 ML IM PRSY
0.5000 mL | PREFILLED_SYRINGE | INTRAMUSCULAR | Status: AC
  Administered 2019-01-26: 0.5 mL via INTRAMUSCULAR
  Filled 2019-01-25: qty 0.5

## 2019-01-25 MED ORDER — UMECLIDINIUM BROMIDE 62.5 MCG/INH IN AEPB
1.0000 | INHALATION_SPRAY | Freq: Every day | RESPIRATORY_TRACT | Status: DC
  Administered 2019-01-25 – 2019-01-30 (×6): 1 via RESPIRATORY_TRACT
  Filled 2019-01-25: qty 7

## 2019-01-25 NOTE — Progress Notes (Signed)
Spoke to RN, patient is for midline. RN to D/C PICC order.

## 2019-01-25 NOTE — Progress Notes (Signed)
[facility] Progress Note  Subjective: doing well, Ox 2 today,no new c/o  Vitals:   01/25/19 0500 01/25/19 0600 01/25/19 0700 01/25/19 0800  BP: (!) 117/52 (!) 114/46 (!) 119/39 (!) 126/57  Pulse: 94 95 (!) 102 (!) 103  Resp: 19 19 (!) 22 19  Temp:    97.9 F (36.6 C)  TempSrc:    Oral  SpO2: 100% 100% 100% 100%  Weight:      Height:        Inpatient medications: . amitriptyline  50 mg Oral QHS  . Chlorhexidine Gluconate Cloth  6 each Topical Daily  . enoxaparin (LOVENOX) injection  40 mg Subcutaneous Q24H  . levothyroxine  88 mcg Oral Q0600  . mouth rinse  15 mL Mouth Rinse BID  . metoprolol tartrate  25 mg Oral BID  . predniSONE  3 mg Oral Daily  . umeclidinium bromide  1 puff Inhalation Daily   . azithromycin Stopped (01/24/19 1642)  . cefTRIAXone (ROCEPHIN)  IV Stopped (01/24/19 1455)  . phenylephrine     acetaminophen, diltiazem, ipratropium, levalbuterol  Exam: GENERAL: mildly confused, elderly and pale, no distress HEENT: MMM.  Vision and hearing grossly intact.  Mouth is dry.  NECK: Supple.  No apparent JVD.  RESP:  No IWOB. Good air movement bilaterally.  Crackles over R ant/ post chest lower lobes.  CVS: HR down 115, Regular rhythm.. Bounding heart sounds.  ABD/GI/GU: Bowel sounds present. Soft. Non tender.  MSK/EXT:  Moves extremities. No apparent deformity or edema.  SKIN: Small laceration over left occipital parietal region-repaired, and forearms bilaterally without active bleed. NEURO: Awake, alert, Ox 2, got the year wrong. Nonfocal, moves all ext. gen'd mildly weak.    HPI: Jenna Carrillo is a 85 y.o. female with history of COPD not on oxygen, HTN, HLD, hypothyroidism, polymyalgia rheumatica and possibly CKD-3 after mechanical fall episode at home. Patient appears confused and slightly agitated fighting with the pulse ox wire.  However, she follows some command.  History obtained from patient's daughter, Jenna Carrillo over the phone.  Per patient's  daughter, patient lives with her son who is bedridden from [place].  She was found wedged between the toilet and wall yesterday morning about 8 AM when the caregiver went into check on her.  It is unclear how long she has been on ground.  She may have fallen at 6 AM per his son's report to Jenna Carrillo. They called EMS.  She only had a little bit of bruise on her elbow.  She refused to come to the hospital   Patient had another fall about 8 AM this morning while trying to change into pair of pants.  She fell back and hit her head on the corner of the dresser.  Has some bleeding.  No loss of consciousness.  EMS called and brought to ED.  Patient's daughter denies fever, cold symptoms, nausea, vomiting, facial droop, focal weakness or speech change.  She has chronic cough which is at baseline.  Patient was recently treated for UTI by PCP and symptoms resolved after treatment.  Per daughter, mental status is basically appropriate for age except for occasionally spacing out at baseline.  She is former smoker.  Denies alcohol recreational drug use. Daughter thinks patient needs 24/7 and is planning to move her in.  ED Course: vital signs within normal range on arrival except for mild temperature to 99.3.  Then she became tachycardic to 130s and 140s and tachypneic to 20s and 30s.  Blood pressure stable.  Saturating at 100% on 2 L.  CMP not impressive.  CBC with leukocytosis.  Differential not obtained.  UA with 5 ketones, 30 protein and rare bacteria.  CT head without acute intracranial finding or skull fracture but hematoma over.  Left skull posteriorly.  Portable CXR with mild opacities over RLL raising concern for aspiration/early infection.  EKG sinus tachycardia on my review.  Per EDP, patient had a flutter on telemetry and started on Cardizem drip.  She was also started on azithromycin and ceftriaxone, and hospital service was called for admission.  I have requested EDP to order a rapid COVID test to exclude COVID-19.    Home meds :  - amitriptyline 50 hs  - spiriva 18ug qd/ proair HFA prn/ prednisone 3mg  qd  - metoprolol xl 50 bid  - omeprazole 20 prn/ levothyroxine 88 ug  - nitrofurantoin 100 bid  - prn's/ vitamins/ supplements      Hospital Course:  Sepsis likely due to pneumonia: she has SIRS, mental status change and CXR finding concerning for RLL pneumonia.  Tachycardia and could be due to agitation.  She is febrile to 99.3 (geriatric) and leukocytosis to 21.  Patient is immunocompromise due to chronic steroid.  UA negative.  -Continue ceftriaxone and azithromycin - WBC down 21 > 16k, temp 99.6 and improving -Continue home prednisone 3mg /d (copd), can change to IV if needed -procalcitonin ^'d 4.71 c/w bact pna -COVID-19 negative - still tachy and looks dry , will start IVF"s -keep on SDU for now  Fall at home: seems mechanical from patient's daughter description.  Lives with a son who is bedridden from [place]. -Fall precaution -PT/OT eval  Tachycardia: reportedly had "flutter" on telemetry per EDP and started on Cardizem drip.  I suspect tachycardia to be due to agitation.  EKG reveals sinus tachycardia. Gave NS bolus and dc'd diltiazem drip and resumed home metoprolol with PRN p.o. Cardizem - repeat EKG today , doubt she had flutter, looks like sinus tach  Acute metabolic encephalopathy: this is due to acute infection and elderly state. Better today. CT head without acute or cranial finding or skull fracture.  no focal deficits.  -Delirium and fall precautions. -Treat treatable causes.  Chronic COPD: not on oxygen at home. -Xopenex and Atrovent every 4 hours as needed -Budesonide and Anoro Ellipta -Continue home prednisone  Hypertension: Normotensive -Resume home metoprolol  Polymyalgia rheumatica -Continue low-dose prednisone  Hypothyroidism -Continue home Synthroid  Depression/insomnia -Continue home amitriptyline  DVT prophylaxis: Subcu Lovenox     Code Status: DNR per  patient's daughter, Jenna Carrillo Family Communication: no family here Disposition: not medically ready Consults called: None Admission status: Inpatient/ SDU    Jenna Carrillo / [facility] [phone] 01/25/2019, 8:56 AM        Imaging   CTA chest 9/7 > IMPRESSION: 1. No evidence of pulmonary embolus. 2. Calcific atherosclerotic disease of the coronary arteries and aorta. 3. Bilateral small pleural effusions with dependent atelectasis in the lung bases. 4. Areas of scarring containing coarse calcifications in the right upper lobe, right middle lobe and lingula.   Recent Labs  Lab 01/24/19 1105 01/24/19 1413 01/24/19 2128 01/25/19 0206  NA 137  --  138 139  K 4.0  --  3.5 3.8  CL 98  --  108 111  CO2 28  --  25 23  GLUCOSE 116*  --  135* 114*  BUN 18  --  15 12  CREATININE 0.96 0.88 0.79 0.71  CALCIUM 8.8*  --  7.7* 7.7*   Recent Labs  Lab 01/24/19 1105  AST 30  ALT 19  ALKPHOS 98  BILITOT 0.7  PROT 6.8  ALBUMIN 3.3*   Recent Labs  Lab 01/24/19 1105 01/24/19 1413 01/25/19 0206  WBC 21.2* 21.3* 16.2*  NEUTROABS  --  18.8*  --   HGB 12.2 9.6* 9.5*  HCT 39.0 31.1* 31.4*  MCV 96.3 97.2 99.7  PLT 275 217 215   Iron/TIBC/Ferritin/ %Sat No results found for: IRON, TIBC, FERRITIN, IRONPCTSAT

## 2019-01-25 NOTE — Progress Notes (Signed)
Spoke with primary RN regarding IV access. Per RN patient current IV access working good at this time. Will follow up and reassess in am the need for PICC/Midline.

## 2019-01-25 NOTE — Evaluation (Signed)
Physical Therapy Evaluation Patient Details Name: Jenna Carrillo MRN: [id number] DOB: 01/07/1931 Today's Date: 01/25/2019   History of Present Illness  85 yo female admitted with sepsis, fall, tachycardia. Hx of COPD, OA, polymyalgia rheumatica, cognitive deficits (? dementia)  Clinical Impression  On eval, pt required Min-Mod assist for mobility. She was able to perform a stand pivot x 2, bed<>bsc, with use of a RW. Pt is unsteady. She still exhibits some confusion. She was somewhat restless in bed at the start of the session. RN did not feel it was safe to have pt OOB>chair on today. O2 sat fluctuated throughout session; appeared to drop well below 90% on RA. Replaced [place] O2 and pt remained on 2L for remainder of session. At this time, recommendation is for SNF rehab. Will continue to follow.     Follow Up Recommendations SNF    Equipment Recommendations  None recommended by PT    Recommendations for Other Services OT consult     Precautions / Restrictions Precautions Precautions: Fall Precaution Comments: monitor O2 Restrictions Weight Bearing Restrictions: No      Mobility  Bed Mobility Overal bed mobility: Needs Assistance Bed Mobility: Supine to Sit;Sit to Supine     Supine to sit: Min assist;HOB elevated Sit to supine: Mod assist;HOB elevated   General bed mobility comments: Assist for trunk and LEs. Increased time and effort, especially with getting LEs back onto bed.  Transfers Overall transfer level: Needs assistance Equipment used: Rolling walker (2 wheeled) Transfers: Sit to/from [facility] Sit to Stand: Min assist Stand pivot transfers: Min assist       General transfer comment: Assist to rise, stabilize, control descent. VCs safety, technique, hand placement. Stand pivot, x 2, bed<>bsc, with RW. Pt had some difficulty with directional cues 2* confusion.  Ambulation/Gait             General Gait Details: Nt on today  Stairs             Wheelchair Mobility    Modified Rankin (Stroke Patients Only)       Balance Overall balance assessment: Needs assistance         Standing balance support: Bilateral upper extremity supported Standing balance-Leahy Scale: Poor                               Pertinent Vitals/Pain Pain Assessment: No/denies pain    Home Living Family/patient expects to be discharged to:: Private residence Living Arrangements: Children(lives with son who has MS and is bedridden (per chart))   Type of Home: House         Home Equipment: Environmental consultant - 2 wheels      Prior Function Level of Independence: Independent with assistive device(s)         Comments: multiple falls at home (per chart)     Hand Dominance        Extremity/Trunk Assessment   Upper Extremity Assessment Upper Extremity Assessment: Defer to OT evaluation    Lower Extremity Assessment Lower Extremity Assessment: Generalized weakness    Cervical / Trunk Assessment Cervical / Trunk Assessment: Kyphotic  Communication   Communication: No difficulties  Cognition Arousal/Alertness: Awake/alert Behavior During Therapy: Restless Overall Cognitive Status: No family/caregiver present to determine baseline cognitive functioning  General Comments: unsure of prior cognitive status      General Comments      Exercises     Assessment/Plan    PT Assessment Patient needs continued PT services  PT Problem List Decreased strength;Decreased mobility;Decreased activity tolerance;Decreased balance;Decreased cognition;Decreased knowledge of use of DME;Decreased safety awareness       PT Treatment Interventions DME instruction;Gait training;Therapeutic exercise;Therapeutic activities;Patient/family education;Balance training;Functional mobility training    PT Goals (Current goals can be found in the Care Plan section)  Acute Rehab PT Goals Patient  Stated Goal: none stated PT Goal Formulation: Patient unable to participate in goal setting Time For Goal Achievement: 02/08/19 Potential to Achieve Goals: Good    Frequency Min 3X/week   Barriers to discharge        Co-evaluation               AM-PAC PT "6 Clicks" Mobility  Outcome Measure Help needed turning from your back to your side while in a flat bed without using bedrails?: A Little Help needed moving from lying on your back to sitting on the side of a flat bed without using bedrails?: A Little Help needed moving to and from a bed to a chair (including a wheelchair)?: A Little Help needed standing up from a chair using your arms (e.g., wheelchair or bedside chair)?: A Little Help needed to walk in hospital room?: A Little Help needed climbing 3-5 steps with a railing? : A Lot 6 Click Score: 17    End of Session   Activity Tolerance: Patient tolerated treatment well Patient left: in bed;with call bell/phone within reach;with bed alarm set   PT Visit Diagnosis: Unsteadiness on feet (R26.81);History of falling (Z91.81);Muscle weakness (generalized) (M62.81)    Time: 1610-9604 PT Time Calculation (min) (ACUTE ONLY): 26 min   Charges:   PT Evaluation $PT Eval Moderate Complexity: 1 Mod           Rebeca Alert, PT Acute Rehabilitation Services Pager: [phone] Office: [phone]

## 2019-01-25 NOTE — Evaluation (Signed)
Occupational Therapy Evaluation Patient Details Name: Jenna Carrillo MRN: [id number] DOB: 11-14-1930 Today's Date: 01/25/2019    History of Present Illness 85 yo female admitted with sepsis, fall, tachycardia. Hx of COPD, OA, polymyalgia rheumatica, cognitive deficits (? dementia)   Clinical Impression   Pt was admitted for the above. At baseline, she lives with son who has MS and is bedridden.  Per chart, there is a caregiver--unsure if she assists pt or son or both. Pt oriented only to self and unable to provide history. Pt moves and completes ADL activities with mostly min A. Will follow in acute setting with min guard level goals.     Follow Up Recommendations  SNF;Supervision/Assistance - 24 hour    Equipment Recommendations  3 in 1 bedside commode    Recommendations for Other Services       Precautions / Restrictions Precautions Precautions: Fall Precaution Comments: monitor O2 Restrictions Weight Bearing Restrictions: No      Mobility Bed Mobility Overal bed mobility: Needs Assistance Bed Mobility: Supine to Sit;Sit to Supine     Supine to sit: Min assist;HOB elevated Sit to supine: Mod assist;HOB elevated   General bed mobility comments: Assist for trunk and LEs. Increased time and effort, especially with getting LEs back onto bed.  Transfers Overall transfer level: Needs assistance Equipment used: Rolling walker (2 wheeled) Transfers: Sit to/from [employer] Sit to Stand: Min assist Stand pivot transfers: Min assist       General transfer comment: Assist to rise, stabilize, control descent. VCs safety, technique, hand placement. Stand pivot, x 2, bed<>bsc, with RW. Pt had some difficulty with directional cues 2* confusion.    Balance Overall balance assessment: Needs assistance         Standing balance support: Bilateral upper extremity supported Standing balance-Leahy Scale: Poor                             ADL  either performed or assessed with clinical judgement   ADL Overall ADL's : Needs assistance/impaired Eating/Feeding: Set up;Supervision/ safety   Grooming: Set up   Upper Body Bathing: Minimal assistance   Lower Body Bathing: Minimal assistance   Upper Body Dressing : Minimal assistance   Lower Body Dressing: Minimal assistance   Toilet Transfer: Minimal assistance;Stand-pivot;BSC;RW   Toileting- Clothing Manipulation and Hygiene: Moderate assistance         General ADL Comments: pt crosses legs for adls.  She used BSC during session.  Returned back to bed at end of session.  Pt active and fidgety with lines     Vision         Perception     Praxis      Pertinent Vitals/Pain Pain Assessment: No/denies pain     Hand Dominance     Extremity/Trunk Assessment Upper Extremity Assessment Upper Extremity Assessment: Generalized weakness      Cervical / Trunk Assessment Cervical / Trunk Assessment: Kyphotic   Communication Communication Communication: No difficulties   Cognition Arousal/Alertness: Awake/alert Behavior During Therapy: Restless Overall Cognitive Status: No family/caregiver present to determine baseline cognitive functioning                                 General Comments: unsure of prior cognitive status.  Pt overattending and figdeting with lines; able to focus long enough for hygiene   General Comments  pt needed 2  liters 02; sats down to 84 on RA    Exercises     Shoulder Instructions      Home Living Family/patient expects to be discharged to:: Private residence Living Arrangements: Children   Type of Home: House                       Home Equipment: Gilford Rile - 2 wheels   Additional Comments: son has MS and is bedbound      Prior Functioning/Environment Level of Independence: Independent with assistive device(s)        Comments: multiple falls at home (per chart)        OT Problem List: Decreased  strength;Decreased activity tolerance;Impaired balance (sitting and/or standing);Decreased cognition;Decreased knowledge of use of DME or AE;Cardiopulmonary status limiting activity      OT Treatment/Interventions: Self-care/ADL training;DME and/or AE instruction;Therapeutic exercise;Therapeutic activities;Cognitive remediation/compensation;Patient/family education;Balance training    OT Goals(Current goals can be found in the care plan section) Acute Rehab OT Goals Patient Stated Goal: none stated OT Goal Formulation: Patient unable to participate in goal setting Time For Goal Achievement: 02/08/19 Potential to Achieve Goals: Good ADL Goals Pt Will Transfer to Toilet: with min guard assist;ambulating;bedside commode Pt Will Perform Toileting - Clothing Manipulation and hygiene: with min guard assist;sit to/from stand Additional ADL Goal #1: pt will complete adl with min guard, set up and min cues to sustain attention to task  OT Frequency: Min 2X/week   Barriers to D/C:            Co-evaluation              AM-PAC OT "6 Clicks" Daily Activity     Outcome Measure Help from another person eating meals?: A Little Help from another person taking care of personal grooming?: A Little Help from another person toileting, which includes using toliet, bedpan, or urinal?: A Lot Help from another person bathing (including washing, rinsing, drying)?: A Little Help from another person to put on and taking off regular upper body clothing?: A Little Help from another person to put on and taking off regular lower body clothing?: A Little 6 Click Score: 17   End of Session    Activity Tolerance: Patient tolerated treatment well Patient left: in bed;with call bell/phone within reach;with bed alarm set  OT Visit Diagnosis: Unsteadiness on feet (R26.81);Muscle weakness (generalized) (M62.81);History of falling (Z91.81)                Time: 1751-0258 OT Time Calculation (min): 25  min Charges:  OT General Charges $OT Visit: 1 Visit OT Evaluation $OT Eval Low Complexity: [place], OTR/L Acute Rehabilitation Services [phone] [facility] pager [phone] office 01/25/2019  [place] 01/25/2019, 3:45 PM

## 2019-01-26 ENCOUNTER — Inpatient Hospital Stay (HOSPITAL_COMMUNITY): Payer: Medicare Other

## 2019-01-26 LAB — CBC
HCT: 33.9 % — ABNORMAL LOW (ref 36.0–46.0)
Hemoglobin: 10.4 g/dL — ABNORMAL LOW (ref 12.0–15.0)
MCH: 29.9 pg (ref 26.0–34.0)
MCHC: 30.7 g/dL (ref 30.0–36.0)
MCV: 97.4 fL (ref 80.0–100.0)
Platelets: 228 10*3/uL (ref 150–400)
RBC: 3.48 MIL/uL — ABNORMAL LOW (ref 3.87–5.11)
RDW: 14 % (ref 11.5–15.5)
WBC: 17.2 10*3/uL — ABNORMAL HIGH (ref 4.0–10.5)
nRBC: 0 % (ref 0.0–0.2)

## 2019-01-26 LAB — BASIC METABOLIC PANEL
Anion gap: 8 (ref 5–15)
BUN: 9 mg/dL (ref 8–23)
CO2: 24 mmol/L (ref 22–32)
Calcium: 8.5 mg/dL — ABNORMAL LOW (ref 8.9–10.3)
Chloride: 105 mmol/L (ref 98–111)
Creatinine, Ser: 0.76 mg/dL (ref 0.44–1.00)
GFR calc Af Amer: >60 mL/min (ref >60)
GFR calc non Af Amer: >60 mL/min (ref >60)
Glucose, Bld: 118 mg/dL — ABNORMAL HIGH (ref 70–99)
Potassium: 3.7 mmol/L (ref 3.5–5.1)
Sodium: 137 mmol/L (ref 135–145)

## 2019-01-26 LAB — SEDIMENTATION RATE: Sed Rate: 57 mm/hr — ABNORMAL HIGH (ref 0–22)

## 2019-01-26 LAB — CK: Total CK: 184 U/L (ref 38–234)

## 2019-01-26 LAB — T4, FREE: Free T4: 1.26 ng/dL — ABNORMAL HIGH (ref 0.61–1.12)

## 2019-01-26 LAB — TSH: TSH: 0.119 u[IU]/mL — ABNORMAL LOW (ref 0.350–4.500)

## 2019-01-26 LAB — PROCALCITONIN: Procalcitonin: 2.69 ng/mL

## 2019-01-26 MED ORDER — FUROSEMIDE 10 MG/ML IJ SOLN
20.0000 mg | Freq: Once | INTRAMUSCULAR | Status: AC
  Administered 2019-01-26: 20 mg via INTRAVENOUS

## 2019-01-26 MED ORDER — METOPROLOL TARTRATE 5 MG/5ML IV SOLN
2.5000 mg | Freq: Once | INTRAVENOUS | Status: DC
  Filled 2019-01-26: qty 5

## 2019-01-26 MED ORDER — QUETIAPINE FUMARATE 25 MG PO TABS
12.5000 mg | ORAL_TABLET | Freq: Two times a day (BID) | ORAL | Status: DC
  Administered 2019-01-26 – 2019-01-30 (×9): 12.5 mg via ORAL
  Filled 2019-01-26 (×10): qty 1

## 2019-01-26 MED ORDER — METOPROLOL TARTRATE 50 MG PO TABS
50.0000 mg | ORAL_TABLET | Freq: Two times a day (BID) | ORAL | Status: DC
  Administered 2019-01-26 – 2019-01-28 (×3): 50 mg via ORAL
  Filled 2019-01-26 (×4): qty 1

## 2019-01-26 MED ORDER — AZITHROMYCIN 250 MG PO TABS
500.0000 mg | ORAL_TABLET | Freq: Every day | ORAL | Status: AC
  Administered 2019-01-26 – 2019-01-28 (×3): 500 mg via ORAL
  Filled 2019-01-26 (×3): qty 2

## 2019-01-26 MED ORDER — LEVOTHYROXINE SODIUM 75 MCG PO TABS
75.0000 ug | ORAL_TABLET | Freq: Every day | ORAL | Status: DC
  Administered 2019-01-27 – 2019-01-30 (×4): 75 ug via ORAL
  Filled 2019-01-26 (×4): qty 1

## 2019-01-26 MED ORDER — METOPROLOL TARTRATE 5 MG/5ML IV SOLN
2.5000 mg | Freq: Four times a day (QID) | INTRAVENOUS | Status: DC | PRN
  Filled 2019-01-26: qty 5

## 2019-01-26 MED ORDER — FUROSEMIDE 10 MG/ML IJ SOLN
INTRAMUSCULAR | Status: AC
  Filled 2019-01-26: qty 2

## 2019-01-26 NOTE — Progress Notes (Signed)
[facility] Progress Note  Patient: Jenna Carrillo MRN:[id number]   PCP: Russo, John, MD DOB: 01/04/1931   DOA: 01/24/2019   DOS: 01/26/2019   Date of Service: the patient was seen and examined on 01/26/2019  Brief hospital course: Pt. with PMH of COPD not on oxygen, HTN, HLD, hypothyroidism, polymyalgia rheumatica and possibly CKD-3, presented after mechanical fall episode at home, was found to have community-acquired pneumonia.  Currently further plan is continue IV antibiotics.  Subjective: Agitated.  Confused.  Reports generalized body ache.  Tachycardic.  Unable to sleep last night.  No nausea no vomiting.  RN reports frequent anxiety episodes.  Assessment and Plan: 1.  Sepsis, secondary to right lower lobe pneumonia.  POA ?  Aspiration.  Febrile, leukocytosis, tachycardic, hypotensive. Currently in stepdown unit. Blood cultures so far no growth. MRSA PCR negative. UA unremarkable, urine culture no growth. SARS-CoV-2 is negative. Procalcitonin 4.71, now trending down. CT chest does not suggest presence of significant pneumonia or PE Chest x-ray reviewed personally.  No acute worsening. -We will continue antibiotics.  Transition to oral azithromycin but continue IV ceftriaxone.  2.  Tachycardia. Sinus response to sepsis versus agitation versus a flutter? EKG shows sinus tachycardia.  No evidence of flutter. Continue Cardizem as needed. Continue Lopressor. Continue telemetry monitoring  3.  Acute metabolic encephalopathy. 4. Depression and insomnia.  Patient agitated right now. This is likely secondary to delirium from infection and being in the hospital. Possibility of infection cannot be ruled out. CT head unremarkable. - We will use Seroquel. - Continue home amitriptyline.  4.  Essential hypertension. Hypotension in the setting of tachycardia versus dehydration. Patient was given IV fluid. Blood pressure currently improving. Patient is on Lopressor scheduled  and as needed Cardizem. Monitor.  Stop IV fluids.  5.  Hypothyroidism. Iatrogenic hyperthyroidism. TSH suppressed.  Elevated free T4. Reduce the dose of Synthroid from 88-75 MCG.  6.  Recurrent fall Multiple sclerosis Primary presentation Mechanical fall at home. PT OT recommends SNF versus 24/7 monitoring. Check CK  7.  Polymyalgia rheumatica. Generalized body ache. If CK is normal will increase the dose of the prednisone.  Currently on 3 mg daily. ESR 57  8.  COPD. Not on oxygen at home Continue inhalers.  Diet: Cardiac diet  DVT Prophylaxis: Subcutaneous Lovenox  Advance goals of care discussion: DNR/DNI  Family Communication: no family was present at bedside, at the time of interview.   Disposition:  Discharge to be determined .  Consultants: none Procedures: none  Scheduled Meds: . amitriptyline  50 mg Oral QHS  . Chlorhexidine Gluconate Cloth  6 each Topical Daily  . enoxaparin (LOVENOX) injection  40 mg Subcutaneous Q24H  . influenza vaccine adjuvanted  0.5 mL Intramuscular Tomorrow-1000  . levothyroxine  88 mcg Oral Q0600  . mouth rinse  15 mL Mouth Rinse BID  . metoprolol tartrate  25 mg Oral BID  . predniSONE  3 mg Oral Daily  . QUEtiapine  12.5 mg Oral BID  . umeclidinium bromide  1 puff Inhalation Daily   Continuous Infusions: . azithromycin Stopped (01/25/19 1625)  . cefTRIAXone (ROCEPHIN)  IV Stopped (01/25/19 1515)   PRN Meds: acetaminophen, diltiazem, ipratropium, levalbuterol Antibiotics: Anti-infectives (From admission, onward)   Start     Dose/Rate Route Frequency Ordered Stop   01/24/19 1330  cefTRIAXone (ROCEPHIN) 2 g in sodium chloride 0.9 % 100 mL IVPB     2 g 200 mL/hr over 30 Minutes Intravenous Every 24 hours 01/24/19 1318       01/24/19 1330  azithromycin (ZITHROMAX) 500 mg in sodium chloride 0.9 % 250 mL IVPB     500 mg 250 mL/hr over 60 Minutes Intravenous Every 24 hours 01/24/19 1318         Objective: Physical  Exam: Vitals:   01/26/19 0300 01/26/19 0400 01/26/19 0500 01/26/19 0600  BP: (!) 118/59 (!) 117/40 (!) 115/57 (!) 141/66  Pulse: 95 (!) 106 98 (!) 104  Resp: 17 (!) 29 (!) 33 (!) 28  Temp:  97.9 F (36.6 C)    TempSrc:  Oral    SpO2: 100% 95% 96% 97%  Weight:      Height:        Intake/Output Summary (Last 24 hours) at 01/26/2019 0736 Last data filed at 01/26/2019 0300 Gross per 24 hour  Intake 1364.86 ml  Output -  Net 1364.86 ml   Filed Weights   01/24/19 0933 01/24/19 1719  Weight: 59 kg 51.4 kg   General: alert and oriented to time and place. Appear in mild distress, affect anxious Eyes: PERRL, Conjunctiva normal ENT: Oral Mucosa Clear, moist  Neck: no JVD, no Abnormal Mass Or lumps Cardiovascular: S1 and S2 Present, no Murmur, peripheral pulses symmetrical Respiratory: good respiratory effort, Bilateral Air entry equal and Decreased, no signs of accessory muscle use, bilateral  Crackles, no wheezes Abdomen: Bowel Sound present, Soft and no tenderness, no hernia Skin: no rashes  Extremities: no Pedal edema, no calf tenderness Neurologic: without any new focal findings Gait not checked due to patient safety concerns  Data Reviewed: CBC: Recent Labs  Lab 01/24/19 1105 01/24/19 1413 01/25/19 0206 01/26/19 0157  WBC 21.2* 21.3* 16.2* 17.2*  NEUTROABS  --  18.8*  --   --   HGB 12.2 9.6* 9.5* 10.4*  HCT 39.0 31.1* 31.4* 33.9*  MCV 96.3 97.2 99.7 97.4  PLT 275 217 215 458   Basic Metabolic Panel: Recent Labs  Lab 01/24/19 1105 01/24/19 1413 01/24/19 2128 01/25/19 0206 01/26/19 0157  NA 137  --  138 139 137  K 4.0  --  3.5 3.8 3.7  CL 98  --  108 111 105  CO2 28  --  _0 GLUCOSE 116*  --  135* 114* 118*  BUN 18  --  _1 CREATININE 0.96 0.88 0.79 0.71 0.76  CALCIUM 8.8*  --  7.7* 7.7* 8.5*    Liver Function Tests: Recent Labs  Lab 01/24/19 1105  AST 30  ALT 19  ALKPHOS 98  BILITOT 0.7  PROT 6.8  ALBUMIN 3.3*   No results for input(s):  LIPASE, AMYLASE in the last 168 hours. No results for input(s): AMMONIA in the last 168 hours. Coagulation Profile: Recent Labs  Lab 01/25/19 0206  INR 1.6*   Cardiac Enzymes: Recent Labs  Lab 01/24/19 1554  CKTOTAL 288*   BNP (last 3 results) No results for input(s): PROBNP in the last 8760 hours. CBG: No results for input(s): GLUCAP in the last 168 hours. Studies: [place] Ekg Site Rite  Result Date: 01/25/2019 If Site Rite image not attached, placement could not be confirmed due to current cardiac rhythm.    Time spent: 35 minutes  Author: Berle Mull, MD [facility] 01/26/2019 7:36 AM  To reach On-call, see care teams to locate the attending and reach out to them via [email or web address]. If 7PM-7AM, please contact night-coverage If you still have difficulty reaching the attending provider, please page the [facility] (Director on Call) for [facility] on  amion for assistance.  

## 2019-01-26 NOTE — Progress Notes (Signed)
Pt very anxious, HR has been sustaining in 140s. Pt very restless, attempting to get OOB multiple times. C/o feeling short of breath. O2 Sats dropped to 80s on 4L/[place] and RR 26. Audible crackles and expiratory wheezing bilaterally. MD Posey Pronto paged- 20 mg IV Lasix ordered and given. HR slowly coming down. O2 Sats stable on 4L/[place]. RT notified to request a neb tx. Pt resting calmly in bed now with eyes closed. Will continue to monitor closely and make next RN aware. Daughter called upon pt's arrival to this unit to be made aware of room # change.

## 2019-01-26 NOTE — Plan of Care (Signed)
  Problem: Elimination: Goal: Will not experience complications related to urinary retention Outcome: Progressing   Problem: Pain Managment: Goal: General experience of comfort will improve Outcome: Progressing   Problem: Safety: Goal: Ability to remain free from injury will improve Outcome: Progressing   

## 2019-01-27 DIAGNOSIS — I4891 Unspecified atrial fibrillation: Secondary | ICD-10-CM

## 2019-01-27 LAB — COMPREHENSIVE METABOLIC PANEL
ALT: 36 U/L (ref 0–44)
AST: 47 U/L — ABNORMAL HIGH (ref 15–41)
Albumin: 2.4 g/dL — ABNORMAL LOW (ref 3.5–5.0)
Alkaline Phosphatase: 119 U/L (ref 38–126)
Anion gap: 12 (ref 5–15)
BUN: 11 mg/dL (ref 8–23)
CO2: 25 mmol/L (ref 22–32)
Calcium: 8 mg/dL — ABNORMAL LOW (ref 8.9–10.3)
Chloride: 102 mmol/L (ref 98–111)
Creatinine, Ser: 0.72 mg/dL (ref 0.44–1.00)
GFR calc Af Amer: >60 mL/min (ref >60)
GFR calc non Af Amer: >60 mL/min (ref >60)
Glucose, Bld: 122 mg/dL — ABNORMAL HIGH (ref 70–99)
Potassium: 3.2 mmol/L — ABNORMAL LOW (ref 3.5–5.1)
Sodium: 139 mmol/L (ref 135–145)
Total Bilirubin: 0.5 mg/dL (ref 0.3–1.2)
Total Protein: 5.6 g/dL — ABNORMAL LOW (ref 6.5–8.1)

## 2019-01-27 LAB — CBC WITH DIFFERENTIAL/PLATELET
Abs Immature Granulocytes: 0.09 10*3/uL — ABNORMAL HIGH (ref 0.00–0.07)
Basophils Absolute: 0 10*3/uL (ref 0.0–0.1)
Basophils Relative: 0 %
Eosinophils Absolute: 0.3 10*3/uL (ref 0.0–0.5)
Eosinophils Relative: 2 %
HCT: 32.8 % — ABNORMAL LOW (ref 36.0–46.0)
Hemoglobin: 10.2 g/dL — ABNORMAL LOW (ref 12.0–15.0)
Immature Granulocytes: 1 %
Lymphocytes Relative: 7 %
Lymphs Abs: 1.2 10*3/uL (ref 0.7–4.0)
MCH: 29.8 pg (ref 26.0–34.0)
MCHC: 31.1 g/dL (ref 30.0–36.0)
MCV: 95.9 fL (ref 80.0–100.0)
Monocytes Absolute: 1.2 10*3/uL — ABNORMAL HIGH (ref 0.1–1.0)
Monocytes Relative: 8 %
Neutro Abs: 13.1 10*3/uL — ABNORMAL HIGH (ref 1.7–7.7)
Neutrophils Relative %: 82 %
Platelets: 238 10*3/uL (ref 150–400)
RBC: 3.42 MIL/uL — ABNORMAL LOW (ref 3.87–5.11)
RDW: 13.8 % (ref 11.5–15.5)
WBC: 15.9 10*3/uL — ABNORMAL HIGH (ref 4.0–10.5)
nRBC: 0 % (ref 0.0–0.2)

## 2019-01-27 LAB — MAGNESIUM: Magnesium: 1.7 mg/dL (ref 1.7–2.4)

## 2019-01-27 MED ORDER — METOPROLOL TARTRATE 5 MG/5ML IV SOLN
2.5000 mg | Freq: Once | INTRAVENOUS | Status: AC
  Administered 2019-01-27: 2.5 mg via INTRAVENOUS
  Filled 2019-01-27: qty 5

## 2019-01-27 MED ORDER — BENZONATATE 100 MG PO CAPS
100.0000 mg | ORAL_CAPSULE | Freq: Two times a day (BID) | ORAL | Status: DC
  Administered 2019-01-27 – 2019-01-30 (×7): 100 mg via ORAL
  Filled 2019-01-27 (×7): qty 1

## 2019-01-27 MED ORDER — DILTIAZEM HCL 25 MG/5ML IV SOLN
10.0000 mg | Freq: Once | INTRAVENOUS | Status: AC
  Administered 2019-01-27: 10 mg via INTRAVENOUS
  Filled 2019-01-27: qty 5

## 2019-01-27 MED ORDER — CEFDINIR 300 MG PO CAPS
300.0000 mg | ORAL_CAPSULE | Freq: Two times a day (BID) | ORAL | Status: DC
  Administered 2019-01-27 – 2019-01-30 (×6): 300 mg via ORAL
  Filled 2019-01-27 (×7): qty 1

## 2019-01-27 MED ORDER — GUAIFENESIN ER 600 MG PO TB12
600.0000 mg | ORAL_TABLET | Freq: Two times a day (BID) | ORAL | Status: DC
  Administered 2019-01-27 – 2019-01-30 (×6): 600 mg via ORAL
  Filled 2019-01-27 (×7): qty 1

## 2019-01-27 MED ORDER — METOPROLOL TARTRATE 5 MG/5ML IV SOLN
5.0000 mg | Freq: Once | INTRAVENOUS | Status: AC
  Administered 2019-01-27: 21:00:00 5 mg via INTRAVENOUS
  Filled 2019-01-27: qty 5

## 2019-01-27 MED ORDER — SODIUM CHLORIDE 0.9 % IV BOLUS
500.0000 mL | Freq: Once | INTRAVENOUS | Status: AC
  Administered 2019-01-27: 500 mL via INTRAVENOUS

## 2019-01-27 MED ORDER — DIGOXIN 0.25 MG/ML IJ SOLN
0.5000 mg | Freq: Once | INTRAMUSCULAR | Status: AC
  Administered 2019-01-27: 0.5 mg via INTRAVENOUS
  Filled 2019-01-27: qty 2

## 2019-01-27 MED ORDER — FUROSEMIDE 10 MG/ML IJ SOLN
20.0000 mg | Freq: Once | INTRAMUSCULAR | Status: AC
  Administered 2019-01-27: 20 mg via INTRAVENOUS
  Filled 2019-01-27: qty 2

## 2019-01-27 MED ORDER — POTASSIUM CHLORIDE CRYS ER 20 MEQ PO TBCR
40.0000 meq | EXTENDED_RELEASE_TABLET | ORAL | Status: AC
  Administered 2019-01-27: 40 meq via ORAL
  Filled 2019-01-27: qty 2

## 2019-01-27 MED ORDER — SODIUM CHLORIDE 0.9 % IV BOLUS
250.0000 mL | Freq: Once | INTRAVENOUS | Status: AC
  Administered 2019-01-27: 250 mL via INTRAVENOUS

## 2019-01-27 NOTE — Progress Notes (Signed)
CCMD notified this nurse at 1905 of pt heart rate reaching 150, sustaining in the mid 140's. Upon entering pt room, pt was sitting in recliner. Vitals obtained: RR 26, BP 149/86, 94% on room air, and HR showing 138. NP Schorr made aware. New orders received. Will continue to monitor.

## 2019-01-27 NOTE — TOC Progression Note (Signed)
Transition of Care [facility]) - Progression Note    Patient Details  Name: Jenna Carrillo MRN: [id number] Date of Birth: 27-Jun-1930  Transition of Care [facility]) CM/SW Contact  Zohal Reny, Olegario Messier, RN Phone Number: 01/27/2019, 4:17 PM  Clinical Narrative: 01/27/2019 Medicare Nursing Home Results [email or web address], [id number]&sort=[email or web address]&paging=131 1/6 Close window 31 hospitals within 25 miles from the center of [place], [place]. Nursing Home Search Results Results List Table Nursing Home Information Overall Rating Health Inpections Staffing Quality Ratings [facility] [place] [facility] [address] [place], [place] [address] [phone] Below Average Below Average Below Average Below Average [facility] [place] [facility] [address] [place], [place] [address] [phone] Below Average Below Average Below Average Average [facility] [place] [address] [place], [place] [address] [phone] Much Above Average Much Above Average Average Below Average [facility] [address] [place], [place] [address] [phone] Below Average Below Average Below Average Average 2 out of 5 stars 2 out of 5 stars 2 out of 5 stars 2 out of 5 stars 2 out of 5 stars 2 out of 5 stars 2 out of 5 stars 3 out of 5 stars 5 out of 5 stars 5 out of 5 stars 3 out of 5 stars 2 out of 5 stars 2 out of 5 stars 2 out of 5 stars 2 out of 5 stars 3 out of 5 stars 01/27/2019 Medicare Nursing Home Results [email or web address], [id number]&sort=[email or web address]&paging=131 2/6 Nursing Home Information Overall Rating Health Inpections Staffing Quality Ratings [facility] [address] [place], [place] [address] [phone] Much  Above Average Above Average Much Above Average Much Above Average [facility] [address] [place], [place] [address] [phone] Much Below Average Much Below Average Below Average Below Average [facility] [address] [place], [place] [address] [phone] Much Below Average Much Below Average Below Average Much Below Average [facility] [address] [place], [place] [address] [phone] Much Below Average Much Below Average Below Average Below Average [facility] [address] [place], [place] [address] [phone] Below Average Below Average Below Average Above Average [facility] [address] [place], [place] [address] [phone] Much Above Average Much Above Average Much Above Average Much Above Average 5 out of 5 stars 4 out of 5 stars 5 out of 5 stars 5 out of 5 stars 1 out of 5 stars 1 out of 5 stars 2 out of 5 stars 2 out of 5 stars 1 out of 5 stars 1 out of 5 stars 2 out of 5 stars 1 out of 5 stars 1 out of 5 stars 1 out of 5 stars 2 out of 5 stars 2 out of 5 stars 2 out of 5 stars 2 out of 5 stars 2 out of 5 stars 4 out of 5 stars 5 out of 5 stars 5 out of 5 stars 5 out of 5 stars 5 out of 5 stars 01/27/2019 Medicare Nursing Home Results [email or web address], [id number]&sort=19ASC&paging=131 3/6 Nursing Home Information Overall Rating Health Inpections Staffing Quality Ratings [facility] [address] [place], [place] [address] [phone] Below Average Below Average Below Average Average [facility] [address] [place], [place] [address] [phone] Much Above Average Above Average Much Above Average Much Above Average [facility]  [place], [place] [address] [phone] Average Average Below Average Average [facility] [place], [place] [address] [phone] Much Below Average Much Below Average Below Average Average [place], [place] [address] [phone] Above Average Above Average Below Average Above Average [facility] [address] [place], [place] [address] [phone] Below Average Below Average Below Average Below Average 2 out of 5 stars 2 out of 5 stars 2 out of 5 stars 3 out of 5 stars 5 out of 5 stars 4 out of 5 stars 5 out of 5 stars 5 out of 5 stars 3 out of 5 stars 3 out of 5 stars 2 out of 5 stars 3 out of 5 stars 1 out of 5 stars 1 out of 5 stars 2 out of 5 stars 3 out of 5 stars 4 out of 5 stars 4 out of 5 stars 2 out of 5 stars 4 out of 5 stars 2 out of 5 stars 2 out of 5 stars 2 out of 5 stars 2 out of 5 stars 01/27/2019 Medicare Nursing Home Results [email or web address], [id number]&sort=19ASC&paging=131 4/6 [place] Inpections Staffing Quality Ratings [place], [place] [address] [phone] Much Above Average Much Above Average Below Average Much Above Average [facility] [address] [place], [place] [id number] [phone] Much Above Average Much Above Average Above Average Much Above [place], [place] [address] [phone] Much Below Average Much Below Average Below Average Average [place], [place] [address] [phone] Much Below Average Much Below Average Much Below Average Average [facility] [place], [place] [address] [phone] Much Below Average Much Below Average Below Average Below Average [facility] [address] [place], [place] [address] [phone] Above Average Average Below Average Much Above Average [facility] [address] [place], [place] [address] [phone] Below Average Much Below Average Above Average Average 5 out of 5 stars 5 out of 5 stars 2 out of 5 stars 5 out of 5 stars 5 out of 5 stars 5 out of 5 stars 4 out of 5 stars 5 out of 5 stars 1 out of 5 stars 1 out of 5 stars 2 out of 5 stars 3 out of 5 stars 1 out of 5 stars 1 out of 5 stars 1 out of 5 stars 3 out of 5 stars 1 out of 5 stars 1 out of 5 stars 2 out of 5 stars 2 out of 5 stars 4 out of 5 stars 3 out of 5 stars 2 out of 5 stars 5 out of 5 stars 2 out of 5 stars 1 out of 5 stars 4 out of 5 stars 3 out of 5 stars 01/27/2019 Medicare Nursing Home Results [email or web address], [id number]&sort=19ASC&paging=131 5/6 Nursing Home Information Overall Rating Health Inpections Staffing Quality Ratings [facility] [place], [place] [address] [phone] Much Above Average Above Average Much Above Average Much Above Average [facility] [place], [place] [address] [phone] Much Above Average Above Average Much Above Average Much Above Average [facility] [address] [place], [place] [id number] [phone] Below Average Below Average Below Average Below Average [facility]  [place], [place] [address] [phone] Much Below Average Much Below Average Average Above Average [facility] [address] [place], [place] [address] [phone] Average Average Below Average  Average [facility] [place], [place] [address] [phone] Average Average Below Average Below Average 5 out of 5 stars 4 out of 5 stars 5 out of 5 stars 5 out of 5 stars 5 out of 5 stars 4 out of 5 stars 5 out of 5 stars 5 out of 5 stars 2 out of 5 stars 2 out of 5 stars 2 out of 5 stars 2 out of 5 stars 1 out of 5 stars 1 out of 5 stars 3 out of 5 stars 4 out of 5 stars 3 out of 5 stars 3 out of 5 stars 2 out of 5 stars 3 out of 5 stars 3 out of 5 stars 3 out of 5 stars 2 out of 5 stars 2 out of 5 stars 01/27/2019 Medicare Nursing Home Results [email or web address], [id number]&sort=19ASC&paging=131 6/6 Nursing Home Information Overall Rating Health Inpections Staffing Quality Ratings [facility] [place], [place] [address] [phone] Above Average Above Average Above Average Above [place] [address] [place], [place] [address] [phone] Much Below Average Much Below Average Not Available12 Below Average NursingHomeCompare Footnotes Footnote number Footnote as displayed on nursing home Compare 1 Newly certified nursing home with less than 12-15 months of data available or the nursing opened less than 6 months ago, and there were no data to submit or claims for this measure. 2 Not enough data available to calculate a star rating. 6 This facility did not submit staffing data, or submitted data that did not meet the criteria required to calculate a staffing measure. 7 CMS determined that the percentage was not accurate or data suppressed by CMS for one or more quarters. 9 The number of residents or resident stays is too small to report. Call the facility to discuss this quality measure. 10 The data for this measure is missing or was not submitted. Call the facility to  discuss this quality measure. 12 This facility either did not submit staffing data, has reported a high number of days without a registered nurse onsite, or submitted data that could not be verified through an audit. 13 Results are based on a shorter time period than required. 14 This nursing home is not required to submit data for the [place] Reporting Program. 18 This facility is not rated due to a history of serious quality issues and is included in the special focus facility program. 4 out of 5 stars 4 out of 5 stars 4 out of 5 stars 4 out of 5 stars 1 out of 5 stars 1 out of 5 stars 2 out of 5 stars      Expected Discharge Plan: [place] Barriers to Discharge: Continued Medical Work up  Expected Discharge Plan and Services Expected Discharge Plan: [place]   Discharge Planning Services: CM Consult   Living arrangements for the past 2 months: Single Family Home                                       Social Determinants of Health (SDOH) Interventions    Readmission Risk Interventions  No flowsheet data found.

## 2019-01-27 NOTE — Progress Notes (Deleted)
RN paged RRRN and NP earlier in shift because pt seemed to be slurring her words. NP to bedside. Per RRRN, her NIH scale is normal.  S: pt states she is OK. She denies pain anywhere. She denies SOB. Says she has had a stroke "on the left" previously.  O: Chronically ill, but well appearing elderly female in NAD. VSS. She is alert, oriented and pleasant. She answers questions appropriately. She follows commands immediately. There is no dysarthria or aphasia noted. She has no issues with word finding. MOE x 4 spontaneously. PERRL. Tongue is midline and strength is normal. Smile is symmetrical. Strength is 5/5 and symmetrical. Sensation in intact.  A/P: 1. ? Change in neuro exam-her speech is not slurred. There is no dysarthria, aphasia or any other focal neuro deficit. NP does not feel this is a stroke event. Will continue to monitor for now.  KJKG, NP [facility]

## 2019-01-27 NOTE — Care Management Important Message (Signed)
Important Message  Patient Details IM Letter given to Dessa Phi RN to present to the Patient Name: Jenna Carrillo MRN: [id number] Date of Birth: 1931/02/10   Medicare Important Message Given:  Yes     Kerin Salen 01/27/2019, 10:18 AM

## 2019-01-27 NOTE — Progress Notes (Signed)
Patient continues to have elevated heart rate 150-190 sustained past IV lopressor and NS bolus in progress. Tele reports that patient has converted to afib, rate currently 165. Patient has been transferred to bed and is pale in color. Continues to deny pain, palpitation, and/or shortness of breath. Schorr notified via page for advisement.

## 2019-01-27 NOTE — TOC Progression Note (Signed)
Transition of Care [facility]) - Progression Note    Patient Details  Name: Jenna Carrillo MRN: [id number] Date of Birth: 1931/05/01  Transition of Care [facility]) CM/SW Contact  Adela Esteban, Juliann Pulse, RN Phone Number: 01/27/2019, 4:23 PM  Clinical Narrative:  Spoke to dtr/patient in rm they prefer home w/HHC-will check with [facility] agencies who will accept.     Expected Discharge Plan: [place] Barriers to Discharge: Continued Medical Work up  Expected Discharge Plan and Services Expected Discharge Plan: [place]   Discharge Planning Services: CM Consult   Living arrangements for the past 2 months: Single Family Home                                       Social Determinants of Health (SDOH) Interventions    Readmission Risk Interventions No flowsheet data found.

## 2019-01-27 NOTE — Progress Notes (Signed)
Physical Therapy Treatment Patient Details Name: Jenna Carrillo MRN: [id number] DOB: Oct 05, 1930 Today's Date: 01/27/2019    History of Present Illness 85 yo female admitted with sepsis, fall, tachycardia. Hx of COPD, OA, polymyalgia rheumatica, cognitive deficits (? dementia)    PT Comments    Pt seemed more alert today. Pt did however require max cues to complete a stand pivot transfer to the recliner. Pt did demonstrate dec. balance requiring therapist steady assist. Pt had nasal cannula off her nose when I entered the room. O2 sats at 85%. I placed the o2 back on and was unable to obtain a reading from the pulse ox. Pt did not complain of any SOB. Pt completed ther ex with min demo cues. Pt will continue to benefit from acute skilled PT to maximize mobility and Independence.  Follow Up Recommendations  SNF     Equipment Recommendations  None recommended by PT    Recommendations for Other Services       Precautions / Restrictions Precautions Precautions: Fall Precaution Comments: Difficult to get a reading on finger pulse ox. Was able to get an initial reasing of 85% when I entered. Her  nasal cannual was not properly in her nose. Reapplied properly, but was not able to get a pulse ox reading due to low perfusion. Restrictions Weight Bearing Restrictions: No    Mobility  Bed Mobility Overal bed mobility: Needs Assistance Bed Mobility: Supine to Sit     Supine to sit: Min assist;HOB elevated     General bed mobility comments: verbal cues for increased independence  Transfers Overall transfer level: Needs assistance Equipment used: Rolling walker (2 wheeled) Transfers: Sit to/from [employer] Sit to Stand: Min assist Stand pivot transfers: Min assist       General transfer comment: assistaance to rise and stabolize balance, posterior lean, cue for hand placement and max directional cues to complete stand pivot to chair.  Ambulation/Gait              General Gait Details: Pt agreeable to get into the chair.   Stairs             Wheelchair Mobility    Modified Rankin (Stroke Patients Only)       Balance Overall balance assessment: Needs assistance Sitting-balance support: No upper extremity supported;Feet supported Sitting balance-Leahy Scale: Fair     Standing balance support: Bilateral upper extremity supported Standing balance-Leahy Scale: Poor                              Cognition Arousal/Alertness: Awake/alert Behavior During Therapy: WFL for tasks assessed/performed Overall Cognitive Status: Within Functional Limits for tasks assessed                                 General Comments: pt spoke about her daughter and son. pt was able to follow commands today , but did demonstrate slow processing and problem solving      Exercises General Exercises - Lower Extremity Ankle Circles/Pumps: AROM;Both;10 reps;Supine Quad Sets: AROM;Strengthening;Both;10 reps;Supine Heel Slides: AROM;Strengthening;Both;10 reps;Supine Hip ABduction/ADduction: AROM;Strengthening;Both;10 reps;Supine    General Comments        Pertinent Vitals/Pain Pain Assessment: No/denies pain    Home Living                      Prior Function  PT Goals (current goals can now be found in the care plan section) Progress towards PT goals: Progressing toward goals    Frequency    Min 3X/week      PT Plan Current plan remains appropriate    Co-evaluation              AM-PAC PT "6 Clicks" Mobility   Outcome Measure  Help needed turning from your back to your side while in a flat bed without using bedrails?: A Little Help needed moving from lying on your back to sitting on the side of a flat bed without using bedrails?: A Little Help needed moving to and from a bed to a chair (including a wheelchair)?: A Little Help needed standing up from a chair using your arms (e.g.,  wheelchair or bedside chair)?: A Little Help needed to walk in hospital room?: A Lot Help needed climbing 3-5 steps with a railing? : A Lot 6 Click Score: 16    End of Session Equipment Utilized During Treatment: Gait belt;Oxygen Activity Tolerance: Patient tolerated treatment well;Patient limited by fatigue Patient left: in chair;with call bell/phone within reach Nurse Communication: Mobility status PT Visit Diagnosis: Unsteadiness on feet (R26.81);History of falling (Z91.81);Muscle weakness (generalized) (M62.81)     Time: 7829-5621 PT Time Calculation (min) (ACUTE ONLY): 26 min  Charges:  $Gait Training: 8-22 mins $Therapeutic Exercise: 8-22 mins                     Theodoro Grist, PT   Lelon Mast 01/27/2019, 12:25 PM

## 2019-01-27 NOTE — Progress Notes (Signed)
1943 Initiated orders obtained from [place] in regards to sustained elevated HR that was called from tele monitoring at 1905. Patient continues to sit up in the chair with chair alarm in use. She denies pain, palpitation, and/or shortness of breath. MEWS initiated and communicated with NT, Alexa.

## 2019-01-27 NOTE — Progress Notes (Signed)
PT demonstrated hands on understanding of Flutter- NPC at this time (cough sounds dry at this time).

## 2019-01-27 NOTE — NC FL2 (Signed)
[place] MEDICAID FL2 LEVEL OF CARE SCREENING TOOL     IDENTIFICATION  Patient Name: Jenna Carrillo Birthdate: [birth year] Sex: female Admission Date (Current Location): 01/24/2019  [facility] and [place] Number:  Producer, television/film/video and Address:         Provider Number: [id number]  Attending Physician Name and Address:  Rolly Salter, MD  Relative Name and Phone Number:  Audry Riles [phone]    Current Level of Care: Hospital Recommended Level of Care: Skilled Nursing Facility Prior Approval Number:    Date Approved/Denied:   PASRR Number: [id number]  Discharge Plan: SNF    Current Diagnoses: Patient Active Problem List   Diagnosis Date Noted  . Current chronic use of systemic steroids 01/25/2019  . Encephalopathy 01/25/2019  . Fall from slip, trip, or stumble, initial encounter   . Generalized weakness   . Sepsis due to pneumonia (HCC) 01/24/2019  . Bronchiectasis with (acute) exacerbation (HCC) 12/20/2018  . Lobar pneumonia, unspecified organism (HCC) 02/15/2018  . Nuclear sclerosis of both eyes 03/05/2016  . Glaucoma suspect, bilateral 03/05/2016  . CAP (community acquired pneumonia) 11/12/2015  . Pulmonary nodules 10/12/2015  . Cough 02/03/2014  . Macular degeneration, left eye 12/28/2013  . Glaucoma suspect, both eyes 12/28/2013  . Allergic rhinitis 11/12/2012  . Nuclear cataract 08/08/2011  . BRONCHIECTASIS 03/09/2009  . COPD (chronic obstructive pulmonary disease) (HCC) 03/09/2009  . Essential hypertension 01/31/2009  . Polymyalgia rheumatica (HCC) 01/31/2009    Orientation RESPIRATION BLADDER Height & Weight     Self, Time, Situation  O2(02 3lnc continuously) Continent Weight: 51.4 kg Height:  5\' 1"  (154.9 cm)  BEHAVIORAL SYMPTOMS/MOOD NEUROLOGICAL BOWEL NUTRITION STATUS      Continent Diet(regular)  AMBULATORY STATUS COMMUNICATION OF NEEDS Skin   Limited Assist Verbally Skin abrasions(bilateral lateral area of forearms bruises-foam  dsg)                       Personal Care Assistance Level of Assistance  Bathing, Feeding, Dressing Bathing Assistance: Limited assistance Feeding assistance: Limited assistance Dressing Assistance: Limited assistance     Functional Limitations Info  Sight, Hearing, Speech(upper/lower dentures) Sight Info: Impaired(reading eyeglasses) Hearing Info: Adequate      SPECIAL CARE FACTORS FREQUENCY  PT (By licensed PT), OT (By licensed OT)     PT Frequency: 5x week OT Frequency: 5x week            Contractures Contractures Info: Not present    Additional Factors Info  Code Status, Allergies Code Status Info: DNR Allergies Info: Sulfonamide Derivatives           Current Medications (01/27/2019):  This is the current hospital active medication list Current Facility-Administered Medications  Medication Dose Route Frequency Provider Last Rate Last Dose  . acetaminophen (TYLENOL) tablet 650 mg  650 mg Oral Q6H PRN 03/29/2019, MD   650 mg at 01/27/19 0244  . amitriptyline (ELAVIL) tablet 50 mg  50 mg Oral QHS 03/29/19, MD   50 mg at 01/26/19 2211  . azithromycin (ZITHROMAX) tablet 500 mg  500 mg Oral Daily 2212, MD   500 mg at 01/27/19 0941  . benzonatate (TESSALON) capsule 100 mg  100 mg Oral BID 03/29/19, MD      . cefTRIAXone (ROCEPHIN) 2 g in sodium chloride 0.9 % 100 mL IVPB  2 g Intravenous Q24H Rolly Salter, MD   Stopped at 01/26/19 1706  . Chlorhexidine Gluconate Cloth  2 % PADS 6 each  6 each Topical Daily Roney Jaffe, MD   6 each at 01/26/19 1116  . enoxaparin (LOVENOX) injection 40 mg  40 mg Subcutaneous Q24H Roney Jaffe, MD   40 mg at 01/26/19 1744  . ipratropium (ATROVENT) nebulizer solution 0.5 mg  0.5 mg Nebulization Q4H PRN Roney Jaffe, MD      . levalbuterol Penne Lash) nebulizer solution 0.63 mg  0.63 mg Nebulization Q4H PRN Roney Jaffe, MD      . levothyroxine (SYNTHROID) tablet 75 mcg  75 mcg Oral Q0600 Lavina Hamman, MD   75 mcg at 01/27/19 0557  . MEDLINE mouth rinse  15 mL Mouth Rinse BID Roney Jaffe, MD   15 mL at 01/26/19 2212  . metoprolol tartrate (LOPRESSOR) injection 2.5 mg  2.5 mg Intravenous Q6H PRN Lavina Hamman, MD      . metoprolol tartrate (LOPRESSOR) tablet 50 mg  50 mg Oral BID Lavina Hamman, MD   50 mg at 01/27/19 0941  . predniSONE (DELTASONE) tablet 3 mg  3 mg Oral Daily Roney Jaffe, MD   3 mg at 01/27/19 0941  . QUEtiapine (SEROQUEL) tablet 12.5 mg  12.5 mg Oral BID Lavina Hamman, MD   12.5 mg at 01/27/19 0940  . umeclidinium bromide (INCRUSE ELLIPTA) 62.5 MCG/INH 1 puff  1 puff Inhalation Daily Roney Jaffe, MD   1 puff at 01/27/19 0757     Discharge Medications: Please see discharge summary for a list of discharge medications.  Relevant Imaging Results:  Relevant Lab Results:   Additional Information ss#[id number] Juliann Pulse, [place]

## 2019-01-27 NOTE — Progress Notes (Signed)
[facility] Progress Note  Patient: Jenna Carrillo XBJ:[id number]   PCP: Shon Baton, MD DOB: [birth year]   DOA: 01/24/2019   DOS: 01/27/2019   Date of Service: the patient was seen and examined on 01/27/2019  Chief Complaint  Patient presents with  . Fall     Brief hospital course: Pt. with PMH of COPD not on oxygen, HTN, HLD, hypothyroidism, polymyalgia rheumatica and possibly CKD-3, presentedafter mechanical fall episode at home, was found to have community-acquired pneumonia.  Currently further plan is continue IV diuresis, monitor urine output and heart rate.  Subjective: Denies any acute complaint.  Has a significant cough.  It is dry no sputum production.  No nausea no vomiting.  Still has shortness of breath.  No acute events overnight.  Assessment and Plan: 1.  Sepsis, secondary to right lower lobe pneumonia.  POA ?  Aspiration. On admission patient was febrile, leukocytosis tachycardic, hypotensive. Initially admitted on the floor, transferred to stepdown unit.  Currently back on the floor. Blood cultures so far no growth. MRSA PCR negative. UA unremarkable, urine culture no growth. SARS-CoV-2 is negative. Procalcitonin 4.71, now trending down. CT chest does not suggest presence of significant pneumonia or PE Chest x-ray reviewed personally.  No acute worsening.  Continue with the antibiotic.  Transitioning to oral. Add Mucinex and flutter valve.  2.  Tachycardia. Sinus response to sepsis versus agitation versus a flutter? EKG shows sinus tachycardia.  No evidence of flutter. Continue to monitor on telemetry. Continue Lopressor. Dose increased from 25 mg twice daily to 50 mg twice daily.  3.  Acute metabolic encephalopathy. 4. Depression and insomnia.  Patient agitated right now. This is likely secondary to delirium from infection and being in the hospital. Possibility of infection cannot be ruled out. CT head unremarkable. -Continue Seroquel as well  as amitriptyline.  4.  Essential hypertension. Hypotension in the setting of tachycardia versus dehydration. Patient was given IV fluid. Blood pressure currently improving. Patient is on Lopressor scheduled and as needed Cardizem. Monitor.  Stop IV fluids. Treating with IV diuresis for now.  5.  Hypothyroidism. Iatrogenic hyperthyroidism. TSH suppressed.  Elevated free T4. Reduce the dose of Synthroid from 88-75 MCG.  6.  Recurrent fall Multiple sclerosis Primary presentation Mechanical fall at home. PT OT recommends SNF versus 24/7 monitoring. CK normal.  Social worker consulted for SNF.  7.  Polymyalgia rheumatica. Generalized body ache. If CK is normal will increase the dose of the prednisone.  ESR 57. Continue current dose of prednisone.  8.  COPD. Not on oxygen at home Continue inhalers.  Diet: Regular diet  DVT Prophylaxis: Subcutaneous Lovenox  Advance goals of care discussion: DNR/DNI  Family Communication: no family was present at bedside, at the time of interview.   Disposition:  Discharge to home.  Consultants: one Procedures: none  Scheduled Meds: . amitriptyline  50 mg Oral QHS  . azithromycin  500 mg Oral Daily  . Chlorhexidine Gluconate Cloth  6 each Topical Daily  . enoxaparin (LOVENOX) injection  40 mg Subcutaneous Q24H  . levothyroxine  75 mcg Oral Q0600  . mouth rinse  15 mL Mouth Rinse BID  . metoprolol tartrate  50 mg Oral BID  . predniSONE  3 mg Oral Daily  . QUEtiapine  12.5 mg Oral BID  . umeclidinium bromide  1 puff Inhalation Daily   Continuous Infusions: . cefTRIAXone (ROCEPHIN)  IV Stopped (01/26/19 1706)   PRN Meds: acetaminophen, ipratropium, levalbuterol, metoprolol tartrate Antibiotics: Anti-infectives (From admission,  onward)   Start     Dose/Rate Route Frequency Ordered Stop   01/26/19 1000  azithromycin (ZITHROMAX) tablet 500 mg     500 mg Oral Daily 01/26/19 0918 01/29/19 0959   01/24/19 1330  cefTRIAXone  (ROCEPHIN) 2 g in sodium chloride 0.9 % 100 mL IVPB     2 g 200 mL/hr over 30 Minutes Intravenous Every 24 hours 01/24/19 1318     01/24/19 1330  azithromycin (ZITHROMAX) 500 mg in sodium chloride 0.9 % 250 mL IVPB  Status:  Discontinued     500 mg 250 mL/hr over 60 Minutes Intravenous Every 24 hours 01/24/19 1318 01/26/19 0918       Objective: Physical Exam: Vitals:   01/27/19 0742 01/27/19 0757 01/27/19 1140 01/27/19 1212  BP: (!) 152/77  126/68   Pulse: (!) 117  93   Resp: (!) 22  20   Temp: (!) 97.4 F (36.3 C)  (!) 97.4 F (36.3 C)   TempSrc: Oral  Oral   SpO2: 96% 94% 100% (!) 85%  Weight:      Height:        Intake/Output Summary (Last 24 hours) at 01/27/2019 1356 Last data filed at 01/27/2019 1300 Gross per 24 hour  Intake 120 ml  Output 1450 ml  Net -1330 ml   Filed Weights   01/24/19 0933 01/24/19 1719  Weight: 59 kg 51.4 kg   General: alert and oriented to time, place, and person. Appear in mild distress, affect appropriate Eyes: PERRL, Conjunctiva normal ENT: Oral Mucosa Clear, moist  Neck: no JVD, no Abnormal Mass Or lumps Cardiovascular: S1 and S2 Present, no Murmur, peripheral pulses symmetrical Respiratory: good respiratory effort, Bilateral Air entry equal and Decreased, no signs of accessory muscle use, bilateral  Crackles, no wheezes Abdomen: Bowel Sound present, Soft and no tenderness, no hernia Skin: no rashes  Extremities: no Pedal edema, no calf tenderness Neurologic: without any new focal findings Gait not checked due to patient safety concerns  Data Reviewed: I have personally reviewed and interpreted daily labs, tele strips, imagings as discussed above. I reviewed all nursing notes, pharmacy notes, vitals, pertinent old records I have discussed plan of care as described above with RN and patient/family.  CBC: Recent Labs  Lab 01/24/19 1105 01/24/19 1413 01/25/19 0206 01/26/19 0157 01/27/19 0502  WBC 21.2* 21.3* 16.2* 17.2* 15.9*   NEUTROABS  --  18.8*  --   --  13.1*  HGB 12.2 9.6* 9.5* 10.4* 10.2*  HCT 39.0 31.1* 31.4* 33.9* 32.8*  MCV 96.3 97.2 99.7 97.4 95.9  PLT 275 217 215 228 588   Basic Metabolic Panel: Recent Labs  Lab 01/24/19 1105 01/24/19 1413 01/24/19 2128 01/25/19 0206 01/26/19 0157 01/27/19 0502  NA 137  --  138 139 137 139  K 4.0  --  3.5 3.8 3.7 3.2*  CL 98  --  108 111 105 102  CO2 28  --  '25 23 24 25  ' GLUCOSE 116*  --  135* 114* 118* 122*  BUN 18  --  '15 12 9 11  ' CREATININE 0.96 0.88 0.79 0.71 0.76 0.72  CALCIUM 8.8*  --  7.7* 7.7* 8.5* 8.0*  MG  --   --   --   --   --  1.7    Liver Function Tests: Recent Labs  Lab 01/24/19 1105 01/27/19 0502  AST 30 47*  ALT 19 36  ALKPHOS 98 119  BILITOT 0.7 0.5  PROT 6.8 5.6*  ALBUMIN 3.3* 2.4*   No results for input(s): LIPASE, AMYLASE in the last 168 hours. No results for input(s): AMMONIA in the last 168 hours. Coagulation Profile: Recent Labs  Lab 01/25/19 0206  INR 1.6*   Cardiac Enzymes: Recent Labs  Lab 01/24/19 1554 01/26/19 0807  CKTOTAL 288* 184   BNP (last 3 results) No results for input(s): PROBNP in the last 8760 hours. CBG: No results for input(s): GLUCAP in the last 168 hours. Studies: No results found.   Time spent: 35 minutes  Author: Berle Mull, MD [facility] 01/27/2019 1:56 PM  To reach On-call, see care teams to locate the attending and reach out to them via [email or web address]. If 7PM-7AM, please contact night-coverage If you still have difficulty reaching the attending provider, please page the [facility] (Director on Call) for [facility] on amion for assistance.

## 2019-01-27 NOTE — TOC Initial Note (Signed)
Transition of Care [facility]) - Initial/Assessment Note    Patient Details  Name: Jenna Carrillo MRN: [id number] Date of Birth: 08/22/1930  [facility] CM/SW Contact:    Dessa Phi, RN Phone Number: 01/27/2019, 1:58 PM  Clinical Narrative: PT-recc SNF.spoke to patient/dtr-on phone-both agree to fax out for SNF bed offers-await bed offers.                  Expected Discharge Plan: Skilled Nursing Facility Barriers to Discharge: Continued Medical Work up   Patient Goals and CMS Choice   CMS Medicare.gov Compare Post Acute Care list provided to:: Patient Choice offered to / list presented to : Patient, Adult Children  Expected Discharge Plan and Services Expected Discharge Plan: [place]   Discharge Planning Services: CM Consult   Living arrangements for the past 2 months: Single Family Home                                      Prior Living Arrangements/Services Living arrangements for the past 2 months: Single Family Home Lives with:: Adult Children Patient language and need for interpreter reviewed:: Yes Do you feel safe going back to the place where you live?: Yes      Need for Family Participation in Patient Care: No (Comment) Care giver support system in place?: Yes (comment)   Criminal Activity/Legal Involvement Pertinent to Current Situation/Hospitalization: No - Comment as needed  Activities of Daily Living Home Assistive Devices/Equipment: None ADL Screening (condition at time of admission) Patient's cognitive ability adequate to safely complete daily activities?: Yes Is the patient deaf or have difficulty hearing?: No Does the patient have difficulty seeing, even when wearing glasses/contacts?: No Does the patient have difficulty concentrating, remembering, or making decisions?: No Patient able to express need for assistance with ADLs?: Yes Does the patient have difficulty dressing or bathing?: No Independently performs  ADLs?: No Communication: Independent Dressing (OT): Needs assistance Is this a change from baseline?: Pre-admission baseline Grooming: Needs assistance Is this a change from baseline?: Pre-admission baseline Feeding: Independent Bathing: Needs assistance Is this a change from baseline?: Pre-admission baseline Toileting: Needs assistance Is this a change from baseline?: Pre-admission baseline In/Out Bed: Needs assistance Is this a change from baseline?: Change from baseline, expected to last >3 days Walks in Home: Independent with device (comment) Does the patient have difficulty walking or climbing stairs?: No Weakness of Legs: Both Weakness of Arms/Hands: None  Permission Sought/Granted Permission sought to share information with : Case Manager Permission granted to share information with : Yes, Verbal Permission Granted  Share Information with NAME: [name]     Permission granted to share info w Relationship: dtr  Permission granted to share info w Contact Information: [phone]  Emotional Assessment Appearance:: Appears stated age Attitude/Demeanor/Rapport: Gracious   Orientation: : Oriented to Self, Oriented to Place, Oriented to  Time Alcohol / Substance Use: Never Used Psych Involvement: No (comment)  Admission diagnosis:  Generalized weakness [R53.1] Frequent falls [R29.6] Fall from slip, trip, or stumble, initial encounter [W01.0XXA] Laceration of scalp, initial encounter [S01.01XA] Community acquired pneumonia of right lower lobe of lung ([place]) [J18.1] Sepsis ([place]) [A41.9] Patient Active Problem List   Diagnosis Date Noted  . Current chronic use of systemic steroids 01/25/2019  . Encephalopathy 01/25/2019  . Fall from slip, trip, or stumble, initial encounter   . Generalized weakness   .  Sepsis due to pneumonia (HCC) 01/24/2019  . Bronchiectasis with (acute) exacerbation (HCC) 12/20/2018  . Lobar pneumonia, unspecified organism (HCC) 02/15/2018  .  Nuclear sclerosis of both eyes 03/05/2016  . Glaucoma suspect, bilateral 03/05/2016  . CAP (community acquired pneumonia) 11/12/2015  . Pulmonary nodules 10/12/2015  . Cough 02/03/2014  . Macular degeneration, left eye 12/28/2013  . Glaucoma suspect, both eyes 12/28/2013  . Allergic rhinitis 11/12/2012  . Nuclear cataract 08/08/2011  . BRONCHIECTASIS 03/09/2009  . COPD (chronic obstructive pulmonary disease) (HCC) 03/09/2009  . Essential hypertension 01/31/2009  . Polymyalgia rheumatica (HCC) 01/31/2009   PCP:  Creola Corn, MD Pharmacy:   [facility] [address] - [place], [place] - [phone] [place] [address] Phone: [phone] Fax: [phone]  [facility] [address], [place] - [address] [place] [address] Phone: [phone] Fax: [phone]     Social Determinants of Health (SDOH) Interventions    Readmission Risk Interventions No flowsheet data found.

## 2019-01-28 ENCOUNTER — Encounter (HOSPITAL_COMMUNITY): Payer: Self-pay

## 2019-01-28 ENCOUNTER — Inpatient Hospital Stay (HOSPITAL_COMMUNITY): Payer: Medicare Other

## 2019-01-28 DIAGNOSIS — I5021 Acute systolic (congestive) heart failure: Secondary | ICD-10-CM | POA: Diagnosis present

## 2019-01-28 DIAGNOSIS — I4891 Unspecified atrial fibrillation: Secondary | ICD-10-CM | POA: Diagnosis not present

## 2019-01-28 DIAGNOSIS — I480 Paroxysmal atrial fibrillation: Secondary | ICD-10-CM | POA: Diagnosis not present

## 2019-01-28 DIAGNOSIS — I361 Nonrheumatic tricuspid (valve) insufficiency: Secondary | ICD-10-CM

## 2019-01-28 DIAGNOSIS — I214 Non-ST elevation (NSTEMI) myocardial infarction: Secondary | ICD-10-CM

## 2019-01-28 LAB — BASIC METABOLIC PANEL
Anion gap: 11 (ref 5–15)
BUN: 14 mg/dL (ref 8–23)
CO2: 27 mmol/L (ref 22–32)
Calcium: 8.3 mg/dL — ABNORMAL LOW (ref 8.9–10.3)
Chloride: 101 mmol/L (ref 98–111)
Creatinine, Ser: 0.87 mg/dL (ref 0.44–1.00)
GFR calc Af Amer: >60 mL/min (ref >60)
GFR calc non Af Amer: >60 mL/min (ref >60)
Glucose, Bld: 102 mg/dL — ABNORMAL HIGH (ref 70–99)
Potassium: 3.6 mmol/L (ref 3.5–5.1)
Sodium: 139 mmol/L (ref 135–145)

## 2019-01-28 LAB — CBC
HCT: 33.3 % — ABNORMAL LOW (ref 36.0–46.0)
Hemoglobin: 10.3 g/dL — ABNORMAL LOW (ref 12.0–15.0)
MCH: 29.3 pg (ref 26.0–34.0)
MCHC: 30.9 g/dL (ref 30.0–36.0)
MCV: 94.9 fL (ref 80.0–100.0)
Platelets: 274 10*3/uL (ref 150–400)
RBC: 3.51 MIL/uL — ABNORMAL LOW (ref 3.87–5.11)
RDW: 14 % (ref 11.5–15.5)
WBC: 13.2 10*3/uL — ABNORMAL HIGH (ref 4.0–10.5)
nRBC: 0 % (ref 0.0–0.2)

## 2019-01-28 LAB — ECHOCARDIOGRAM COMPLETE
Height: 61 in
Weight: 1813.06 oz

## 2019-01-28 LAB — MAGNESIUM: Magnesium: 1.8 mg/dL (ref 1.7–2.4)

## 2019-01-28 LAB — TROPONIN I (HIGH SENSITIVITY)
Troponin I (High Sensitivity): 1589 ng/L (ref <18)
Troponin I (High Sensitivity): 1653 ng/L (ref <18)

## 2019-01-28 LAB — HEPARIN LEVEL (UNFRACTIONATED): Heparin Unfractionated: 0.31 IU/mL (ref 0.30–0.70)

## 2019-01-28 MED ORDER — HEPARIN (PORCINE) 25000 UT/250ML-% IV SOLN
750.0000 [IU]/h | INTRAVENOUS | Status: DC
  Administered 2019-01-28: 750 [IU]/h via INTRAVENOUS
  Filled 2019-01-28: qty 250

## 2019-01-28 MED ORDER — HEPARIN (PORCINE) 25000 UT/250ML-% IV SOLN: 800.0000 [IU]/h | INTRAVENOUS | Status: DC

## 2019-01-28 MED ORDER — CARVEDILOL 12.5 MG PO TABS
12.5000 mg | ORAL_TABLET | Freq: Two times a day (BID) | ORAL | Status: DC
  Administered 2019-01-28 – 2019-01-30 (×4): 12.5 mg via ORAL
  Filled 2019-01-28 (×4): qty 1

## 2019-01-28 MED ORDER — APIXABAN 2.5 MG PO TABS
2.5000 mg | ORAL_TABLET | Freq: Two times a day (BID) | ORAL | Status: DC
  Administered 2019-01-28 – 2019-01-30 (×4): 2.5 mg via ORAL
  Filled 2019-01-28 (×4): qty 1

## 2019-01-28 NOTE — Progress Notes (Signed)
Approx. 2100, Notified by primary RN of an elevated HR, but that she had already talked with Chaney Malling, NP and received orders for Lopressor and Cardizem IVP.  I advised at that time that if that doesn't bring the heart rate down to call me and I will come up and assess.  At [place], called to check on patient, primary RN stated heart rate down to the 110s, and is stable.  I advised to notify rapid response for any concern or signs of deterioration @ [phone].  Jacqulyn Ducking ICU/SD RN4 / Care Coordinator / Rapid Response Nurse Rapid Response Number:  [id number] ICU Charge Nurse Number:  [phone]

## 2019-01-28 NOTE — Consult Note (Addendum)
Cardiology Consultation:   Patient ID: Jenna Carrillo; [id number]; 1931/03/29   Admit date: 01/24/2019 Date of Consult: 01/28/2019  Primary Care Provider: Shon Baton, MD Primary Cardiologist: No primary care provider on file. New, Dr Burt Knack Primary Electrophysiologist:  None   Patient Profile:   Jenna Carrillo is a 85 y.o. female with a hx of HTN, HLD, hypothyroid, GERD/HH, OA, COPD and bronchiectasis, PMR, ?CKD III, who is being seen today for the evaluation of PAF at the request of Dr Posey Pronto.  History of Present Illness:   Jenna Carrillo has had 2 falls recently, felt to be mechanical. After the 2nd fall, she was found in the bathroom, wedged between the toilet and the wall. Pt lives w/ her son, bedridden from [place]. Daughter helps in the care of them and there is a caregiver, who found her.   She was found on 09/07 and admitted. Some agitation at first. HR spiked in the ER to 147, but appears to be ST w/ artifact. CXR concerning for aspiration/early infection and ABX initiated. Pt was hypotensive w/ SBP 80s, dx sepsis, rx w/ IVF.   PT/OT rec SNF. Continued problems w/ agitation and confusion.   09/09, More problems w/ tachycardia in the setting of SOB, O2 sats 80s>>IV Lasix given. 09/10, HR elevated to 140s but pt at rest. BP & sats ok. ECG w/ Afib, HR 175, cards asked to see. IV Cardizem and Lopressor help w/ rate control. Troponin checked and elevated, echo w/ decreased EF and +WMA>>more urgency to consult.   Jenna Carrillo is not generally aware of the atrial fibrillation.  She admits to feeling some flutters, but cannot remember when.  She does not think she has had any since she has been here.  She is not sure why she fell, but does not think she passed out.  She has not been having problems with swelling in her legs and until she got here, her breathing was okay.  She has never had chest pain.  Until these 2 recent incidents, she had not been following regularly.  P.o. intake is  poor, she does not eat or drink very much.  03/09/2018, her weight is listed as the 130 pounds.  She was 113 pounds on admission, 01/24/2019.  She is active and alert but weak.  She moves her self around in the bed pretty well.  She is oriented x2.   Past Medical History:  Diagnosis Date  . Bronchiectasis   . COPD (chronic obstructive pulmonary disease) ([place])   . Degenerative disk disease   . Eczema   . Gastric ulcer   . GERD (gastroesophageal reflux disease)   . Hiatal hernia   . Hyperlipidemia   . Hypertension   . Hypothyroidism   . Osteoarthritis   . Polymyalgia rheumatica ([place])   . Pulmonary nodule, right   . Vertebrobasilar insufficiency     Past Surgical History:  Procedure Laterality Date  . BREAST CYST EXCISION     right  . broken tail bone    . CAROTID ENDARTERECTOMY     right  . RHINOPLASTY    . TOTAL ABDOMINAL HYSTERECTOMY       Prior to Admission medications   Medication Sig Start Date End Date Taking? Authorizing Provider  amitriptyline (ELAVIL) 50 MG tablet Take 50 mg by mouth at bedtime.   Yes [provider]  benzonatate (TESSALON) 200 MG capsule Take 1 capsule (200 mg total) by mouth 3 (three) times daily as needed for cough. 12/20/18  Yes Ivonne Andrew, NP  levothyroxine (SYNTHROID, LEVOTHROID) 88 MCG tablet Take 88 mcg by mouth daily before breakfast.   Yes [provider]  metoprolol (TOPROL-XL) 50 MG 24 hr tablet Take 50 mg by mouth 2 (two) times daily.    Yes [provider]  Multiple Vitamin (MULTIVITAMIN) tablet Take 1 tablet by mouth daily.     Yes [provider]  nitrofurantoin, macrocrystal-monohydrate, (MACROBID) 100 MG capsule Take 100 mg by mouth 2 (two) times daily. 01/17/19  Yes [provider]  omeprazole (PRILOSEC) 20 MG capsule Take 20 mg by mouth daily as needed (acid reflux).    Yes [provider]  predniSONE (DELTASONE) 1 MG tablet Take 3 mg by mouth daily.    Yes [provider]  PROAIR HFA 108 (90 Base) MCG/ACT inhaler INHALE 2 PUFFS BY MOUTH EVERY 4 HOURS AS NEEDED Patient taking differently: Inhale 2 puffs into the lungs every 4 (four) hours as needed for wheezing or shortness of breath.  12/08/18  Yes [name], MD  SPIRIVA HANDIHALER 18 MCG inhalation capsule PLACE 1 CAPSULE (18 MCG TOTAL) INTO INHALER AND INHALE DAILY Patient taking differently: Place 18 mcg into inhaler and inhale daily.  01/07/19  Yes [name], MD    Inpatient Medications: Scheduled Meds: . amitriptyline  50 mg Oral QHS  . benzonatate  100 mg Oral BID  . carvedilol  12.5 mg Oral BID WC  . cefdinir  300 mg Oral Q12H  . Chlorhexidine Gluconate Cloth  6 each Topical Daily  . guaiFENesin  600 mg Oral BID  . levothyroxine  75 mcg Oral Q0600  . mouth rinse  15 mL Mouth Rinse BID  . predniSONE  3 mg Oral Daily  . QUEtiapine  12.5 mg Oral BID  . umeclidinium bromide  1 puff Inhalation Daily   Continuous Infusions: . heparin 800 Units/hr (01/28/19 1719)   PRN Meds: acetaminophen, ipratropium, levalbuterol, metoprolol tartrate  Allergies:    Allergies  Allergen Reactions  . Sulfonamide Derivatives     Social History:   Social History   Socioeconomic History  . Marital status: Widowed    Spouse name: Not on file  . Number of children: Not on file  . Years of education: Not on file  . Highest education level: Not on file  Occupational History  . Occupation: Retired    Comment: staining glass  Social Needs  . Financial resource strain: Not on file  . Food insecurity    Worry: Not on file    Inability: Not on file  . Transportation needs    Medical: Not on file    Non-medical: Not on file  Tobacco Use  . Smoking status: Former Smoker    Packs/day: 2.00    Years: 2.00    Pack years: 4.00    Types: Cigarettes    Quit date: 05/19/1958    Years since quitting: 60.7  . Smokeless tobacco: Never Used  Substance and Sexual Activity  . Alcohol use: No  .  Drug use: Never  . Sexual activity: Not on file  Lifestyle  . Physical activity    Days per week: Not on file    Minutes per session: Not on file  . Stress: Not on file  Relationships  . Social Musician on phone: Not on file    Gets together: Not on file    Attends religious service: Not on file    Active member of club  or organization: Not on file    Attends meetings of clubs or organizations: Not on file    Relationship status: Not on file  . Intimate partner violence    Fear of current or ex partner: Not on file    Emotionally abused: Not on file    Physically abused: Not on file    Forced sexual activity: Not on file  Other Topics Concern  . Not on file  Social History Narrative   Patient lives in [place], with her son who is bedridden from multiple sclerosis.   Daughter helps in the care and is planning to move both of them in with her, once everyone is stable.    Family History:   Family History  Problem Relation Age of Onset  . COPD Mother   . Breast cancer Mother   . Emphysema Mother   . Liver disease Son   . Emphysema Father   . Heart attack Father    Family Status:  Family Status  Relation Name Status  . Mother  Deceased  . Son  (Not Specified)  . Father  Deceased    ROS:  Please see the history of present illness.  All other ROS reviewed and negative.     Physical Exam/Data:   Vitals:   01/28/19 0931 01/28/19 1036 01/28/19 1314 01/28/19 1722  BP:  (!) 90/48 (!) 111/54   Pulse:  84 91 (!) 104  Resp:  18 20   Temp:  98.5 F (36.9 C) 98.3 F (36.8 C)   TempSrc:  Oral Oral   SpO2: 91% 99% 100%   Weight:      Height:        Intake/Output Summary (Last 24 hours) at 01/28/2019 1742 Last data filed at 01/28/2019 1400 Gross per 24 hour  Intake 970.02 ml  Output 600 ml  Net 370.02 ml   Filed Weights   01/24/19 0933 01/24/19 1719  Weight: 59 kg 51.4 kg   Body mass index is 21.41 kg/m.  General:  Well nourished, well developed,  female in no acute distress HEENT: normal Lymph: no adenopathy Neck: JVD -not elevated Endocrine:  No thryomegaly Vascular: No carotid bruits; 4/4 extremity pulses 2+, without bruits  Cardiac:  normal S1, S2; RRR; no murmur  Lungs: Normal work of breathing, no wheezing, rhonchi, fine rales Abd: soft, nontender, no hepatomegaly  Ext: no edema Musculoskeletal:  No deformities, BUE and BLE strength normal and equal Skin: warm and dry, injured areas on both elbows are covered with dressings and not disturbed Neuro:  CNs 2-12 intact, no focal abnormalities noted Psych:  Normal affect   EKG:  The EKG was personally reviewed and demonstrates:   ECG 9/7, 1159 is sinus tach, heart rate 147 ECG 9/8 is sinus tach, heart rate 113 ECG 9/10 is atrial fibrillation, RVR, heart rate 175 Telemetry:  Telemetry was personally reviewed and demonstrates: Episode of atrial fibrillation, RVR lasting several hours last p.m.  Spontaneous conversion to sinus rhythm during the night, maintaining this now   CV studies:   ECHO: 01/28/2019  1. Severe hypokinesis of the left ventricular, mid-apical anteroseptal wall, anterior wall and apical segment.  2. The left ventricle has mild-moderately reduced systolic function, with an ejection fraction of 40-45%. The cavity size was normal. Left ventricular diastolic Doppler parameters are consistent with pseudonormalization. Elevated mean left atrial pressure.  3. The right ventricle has normal systolic function. The cavity was normal. There is no increase in right ventricular wall thickness. Right  ventricular systolic pressure is severely elevated.  4. Left atrial size was mildly dilated. R atrium normal  5. Moderate pleural effusion in the left lateral region.  6. The mitral valve is degenerative. Moderate thickening of the mitral valve leaflet. Moderate calcification of the mitral valve leaflet. There is moderate mitral annular calcification present. Mild mitral valve  stenosis.  7. Tricuspid valve regurgitation is mild-moderate.  8. Mild thickening of the aortic valve.  9. The aorta is not well visualized unless otherwise noted. 10. The inferior vena cava was normal in size with <50% respiratory variability. 11. No intracardiac thrombi or masses were visualized.   Laboratory Data:   Chemistry Recent Labs  Lab 01/26/19 0157 01/27/19 0502 01/28/19 0232  NA 137 139 139  K 3.7 3.2* 3.6  CL 105 102 101  CO2 24 25 27   GLUCOSE 118* 122* 102*  BUN 9 11 14   CREATININE 0.76 0.72 0.87  CALCIUM 8.5* 8.0* 8.3*  GFRNONAA >60 >60 >60  GFRAA >60 >60 >60  ANIONGAP 8 12 11     Lab Results  Component Value Date   ALT 36 01/27/2019   AST 47 (H) 01/27/2019   ALKPHOS 119 01/27/2019   BILITOT 0.5 01/27/2019   Hematology Recent Labs  Lab 01/26/19 0157 01/27/19 0502 01/28/19 0232  WBC 17.2* 15.9* 13.2*  RBC 3.48* 3.42* 3.51*  HGB 10.4* 10.2* 10.3*  HCT 33.9* 32.8* 33.3*  MCV 97.4 95.9 94.9  MCH 29.9 29.8 29.3  MCHC 30.7 31.1 30.9  RDW 14.0 13.8 14.0  PLT 228 238 274   Cardiac Enzymes High Sensitivity Troponin:   Recent Labs  Lab 01/28/19 0232 01/28/19 0425  TROPONINIHS 1,589* 1,653*      DDimer  Recent Labs  Lab 01/24/19 1554  DDIMER 2.49*   TSH:  Lab Results  Component Value Date   TSH 0.119 (L) 01/26/2019   Free T4  Date Value Ref Range Status  01/26/2019 1.26 (H) 0.61 - 1.12 ng/dL Final    Comment:    (NOTE) Biotin ingestion may interfere with free T4 tests. If the results are inconsistent with the TSH level, previous test results, or the clinical presentation, then consider biotin interference. If needed, order repeat testing after stopping biotin. Performed at [facility], [address] [place], [place] [address]     Magnesium:  Magnesium  Date Value Ref Range Status  01/28/2019 1.8 1.7 - 2.4 mg/dL Final    Comment:    Performed at [facility], [address] [place], [place]  [id number]     Radiology/Studies:  Ct Angio Chest Pe W Or Wo Contrast  Result Date: 01/24/2019 CLINICAL DATA:  Positive D-dimer. COPD. EXAM: CT ANGIOGRAPHY CHEST WITH CONTRAST TECHNIQUE: Multidetector CT imaging of the chest was performed using the standard protocol during bolus administration of intravenous contrast. Multiplanar CT image reconstructions and MIPs were obtained to evaluate the vascular anatomy. CONTRAST:  80mL OMNIPAQUE IOHEXOL 350 MG/ML SOLN COMPARISON:  November 27, 2015 FINDINGS: Cardiovascular: Satisfactory opacification of the pulmonary arteries to the segmental level. No evidence of pulmonary embolism. Normal heart size. No pericardial effusion. Calcific atherosclerotic disease of the coronary arteries and aorta. Mediastinum/Nodes: No enlarged mediastinal, hilar, or axillary lymph nodes. Thyroid gland, trachea, and esophagus demonstrate no significant findings. Lungs/Pleura: Bilateral small pleural effusions with dependent atelectasis in the lung bases. Areas of scarring containing coarse calcifications are seen in the right upper lobe, and right middle lobe and lingula Upper Abdomen: No acute abnormality. Musculoskeletal:  Spondylosis and exaggerated thoracic kyphosis. Review of the MIP images confirms the above findings. IMPRESSION: 1. No evidence of pulmonary embolus. 2. Calcific atherosclerotic disease of the coronary arteries and aorta. 3. Bilateral small pleural effusions with dependent atelectasis in the lung bases. 4. Areas of scarring containing coarse calcifications in the right upper lobe, right middle lobe and lingula. Aortic Atherosclerosis (ICD10-I70.0). Electronically Signed   By: Ted Mcalpine M.D.   On: 01/24/2019 19:29   Dg Chest Port 1 View  Result Date: 01/26/2019 CLINICAL DATA:  Shortness of breath and abdominal discomfort. EXAM: PORTABLE CHEST 1 VIEW COMPARISON:  Single-view of the chest 01/24/2019. PA and lateral chest 02/15/2018. CT chest 01/24/2019. FINDINGS:  Aeration has markedly worsened since the most recent exam with diffuse bilateral airspace disease and small pleural effusions now seen. No pneumothorax. Heart size is upper normal. Atherosclerosis noted. IMPRESSION: Diffuse bilateral airspace disease and pleural effusions most consistent with congestive failure. Right middle lobe scar seen on prior CT and most recent plain film comparison is not visible on this exam. Electronically Signed   By: Drusilla Kanner M.D.   On: 01/26/2019 11:06   Dg Abd Portable 1v  Result Date: 01/26/2019 CLINICAL DATA:  Abdominal discomfort and shortness of breath. EXAM: PORTABLE ABDOMEN - 1 VIEW COMPARISON:  None. FINDINGS: The bowel gas pattern is normal. No radio-opaque calculi or other significant radiographic abnormality are seen. IMPRESSION: Negative exam. Electronically Signed   By: Drusilla Kanner M.D.   On: 01/26/2019 11:00   [place] Ekg Site Rite  Result Date: 01/25/2019 If Site Rite image not attached, placement could not be confirmed due to current cardiac rhythm.   Assessment and Plan:   1. Afib, RVR - happened in the setting of acute illness, interesting that it was not actually seen until she was starting to get better - ER strips not available, but ECG then was ST - Prior to admission, she was on Toprol-XL 50 mg twice daily. -On admission, she was put on metoprolol 25 mg twice daily and this was increased to 50 mg twice daily for better heart rate control although at least 1 dose was held because of hypotension. - She has been changed to Coreg 12.5 mg twice daily as of this p.m. - Follow heart rate and blood pressure on this dosing - IV Cardizem was used at times for rate control, but would not use it long-term because of her decreased EF  2.  Non-STEMI - She has elevated cardiac enzymes, decreased EF with WMA in the setting of acute illness and tachycardia>>most likely Takotsubo CM - There are no reports of chest pain - We will repeat her ECG to see  if she is developing Q waves - Because of the need for anticoagulation, avoid aspirin, continue BB - MD advise if high-dose statin indicated here -At this time, because of co-morbidities, no ischemic workup planned. However, atherosclerotic dz was seen on PE CT.  - if pt develops ischemic sx, or EF worsens, consider ischemic eval - SBP 90 at times and with tachycardia, preference strongly given to beta-blocker.  Therefore, will not try to add ARB or Entresto at this time - P.o. intake is poor so not sure she would need daily diuretic use. Consider scheduling Lasix 20 mg 2-3 times a week vs PRN only - we will follow with you. - recheck echo in 6-8 weeks as outpt.  3. Anticoagulation - CHA2DS2-VASc = 4 (age x 2, female, HTN) - Anticoag indicated, currently on heparin -  she is >/= 85 yo and wt is </= 60 kg, so qualifies for reduced dose Eliquis - if she continues to fall, will need to rethink anticoag. - However, if falls were related to developing CAP and sepsis, her fall risk may not be that high after recovery.  Otherwise, per IM Principal Problem:   Sepsis due to pneumonia [facility](HCC) Active Problems:   Essential hypertension   COPD (chronic obstructive pulmonary disease) ([facility])   Polymyalgia rheumatica (H[facility])   CAP (community acquired pneumonia)   Current chronic use of systemic steroids   Encephalopathy   Fall from slip, trip, or stumble, initial encounter   Generalized weakness   Atrial fibrillation ([facility])   For questions or updates, please contact [facility] Please consult [email or web address] for contact info under Cardiology/STEMI.   SignedTheodore Demark, Rhonda Barrett, PA-C  01/28/2019 5:42 PM  Patient seen, examined. Available data reviewed. Agree with findings, assessment, and plan as outlined by Theodore Demarkhonda Barrett, PA-C.  The patient is an elderly woman in no distress.  JVP is normal, carotid upstrokes are normal without bruits, lungs are clear with diminished air movement and prolonged  expiratory phase, heart is regular rate and rhythm with no murmur or gallop, abdomen is soft and nontender, extremities show no significant edema.  The patient presented after a fall.  She had hypotension and has been covered with broad-spectrum antibiotics for the possibility of community-acquired pneumonia and sepsis.  Her initial cultures have been negative.  An echocardiogram has demonstrated moderate LV systolic dysfunction with segmental wall motion abnormalities in the mid anterior, mid inferior, and periapical walls.  Her LVEF is estimated at 45%.  She developed acute systolic heart failure with volume overload after requiring fluid resuscitation for hypotension.  Her troponin is mildly elevated but she has not had any chest discomfort.  In addition, she has developed atrial fibrillation with RVR last night that converted spontaneously back to sinus rhythm.  I agree it with the plan as outlined above.  The patient is not a candidate for invasive evaluation because of her advanced age and frailty.  I suspect she has acute stress cardiomyopathy based on the pattern of her wall motion abnormalities and clinical presentation.  I agree with using carvedilol.  I do not think her blood pressure can tolerate the addition of an ACE inhibitor or ARB at this time.  Would probably hold on further diuresis for now.  I discussed the pros and cons of oral anticoagulation with the patient and her daughter who is present at the bedside this evening.  The patient has previously been functionally independent albeit moderately frail.  She has not had falls prior to her current presentation.  I think her stroke risk likely exceeds her risk of bleeding.  I have recommended apixaban 2.5 mg twice daily.  Will discontinue heparin.  She should have a repeat echocardiogram in about 3 months to see if her LV function has normalized.  Tonny BollmanMichael Donyell Carrell, M.D. 01/28/2019 6:47 PM

## 2019-01-28 NOTE — Progress Notes (Signed)
  Echocardiogram 2D Echocardiogram has been performed.  Jannett Celestine 01/28/2019, 9:24 AM

## 2019-01-28 NOTE — Progress Notes (Addendum)
Shift event note:  Notified at approx 1940 regarding pt w/ rhythm change to a-fib/RVR w/ rate in 140-150's. Pt asymptomatic. Orders placed for Lopressor 2.5 mg and  IVB. Re-paged in approx 1 hour and notified HR now 150-190's s/p Lopressor and IVF. Orders placed for repeat Lopressor IV (5 mg) and repeat IVFB. Orders also placed for Digoxin 0.5 mg IV and EKG. EKG reveals A-fib/RVR w/ rate of 175 and w/o obvious ischemic changes. Pt remains asymptomatic. At bedside pt noted pleasantly confused but responds to name. Denies pain. Appears to be resting in NAD. BBS w/ crackles at the bases L>R. Remaining VSS. Cardizem 10 mg IV added.  Assessment/Plan: 1. New onset afib/RVR: No h/o same. Likely related to acute pulmonary illness (PNA) in setting of pt w/ chronic COPD. Initially refractory to meds though now HR trending down to one teens. (116). Remains asymptomatic. Will cycle troponin's. Consider Echo in am. If unable to maintain rate control will consider transfer to telemetry where Cardizem drip can be started. Will hold off on ac as pt likely not a candidate for long term ac given frequent falls. Will continue to monitor closely on telemetry.   Jeryl Columbia, NP-C [facility] Pager [phone]  CRITICAL CARE Performed by: Jeryl Columbia   Total critical care time: 120 minutes  Critical care time was exclusive of separately billable procedures and treating other patients.  Critical care was necessary to treat or prevent imminent or life-threatening deterioration.  Critical care was time spent personally by me on the following activities: development of treatment plan with patient and/or surrogate as well as nursing, discussions with consultants, evaluation of patient's response to treatment, examination of patient, obtaining history from patient or surrogate, ordering and performing treatments and interventions, ordering and review of laboratory studies, ordering and review of  radiographic studies, pulse oximetry and re-evaluation of patient's condition.   Addedum: Notified at approx 0330 regarding elevated troponin at 1589. Pt has rested in NAD w/o c/o CP. HR now 95-100. Remains in a-fib. Discussed pt w/ Dr Marletta Lor w/ cardiology service who has recommended trending troponins, heparin per pharmacy w/o bolus and echo in am. She will place pt on list to be seen in am but requests cards be re-consulted in am. Will continue to monitor closely on telemetry.

## 2019-01-28 NOTE — Progress Notes (Signed)
ANTICOAGULATION CONSULT NOTE - Initial Consult  Pharmacy Consult for Heparin Indication: atrial fibrillation  Allergies  Allergen Reactions  . Sulfonamide Derivatives     Patient Measurements: Height: 5\' 1"  (154.9 cm) Weight: 113 lb 5.1 oz (51.4 kg) IBW/kg (Calculated) : 47.8 Heparin Dosing Weight = TBW = 51 kg  Vital Signs: Temp: 98.3 F (36.8 C) (09/11 1314) Temp Source: Oral (09/11 1314) BP: 111/54 (09/11 1314) Pulse Rate: 91 (09/11 1314)  Labs: Recent Labs    01/26/19 0157 01/26/19 0807 01/27/19 0502 01/28/19 0232 01/28/19 0425 01/28/19 1357  HGB 10.4*  --  10.2* 10.3*  --   --   HCT 33.9*  --  32.8* 33.3*  --   --   PLT 228  --  238 274  --   --   HEPARINUNFRC  --   --   --   --   --  0.31  CREATININE 0.76  --  0.72 0.87  --   --   CKTOTAL  --  184  --   --   --   --   TROPONINIHS  --   --   --  1,589* 1,653*  --     Estimated Creatinine Clearance: 35.7 mL/min (by C-G formula based on SCr of 0.87 mg/dL).   Medical History: Past Medical History:  Diagnosis Date  . Bronchiectasis   . COPD (chronic obstructive pulmonary disease) ([place])   . Degenerative disk disease   . Eczema   . Gastric ulcer   . GERD (gastroesophageal reflux disease)   . Hiatal hernia   . Hyperlipidemia   . Hypertension   . Hypothyroidism   . Osteoarthritis   . Polymyalgia rheumatica ([place])   . Pulmonary nodule, right   . Vertebrobasilar insufficiency     Medications:  Infusions:  . heparin 750 Units/hr (01/28/19 0600)    Assessment: Pharmacy consulted to dose/monitor heparin infusion for atrial fibrillation. Patient was not on anticoagulation PTA.   Today, 01/28/19  Hgb 10.3 - low but stable  Plt 274 - WNL, stable  Troponin: 1,653  HL = 0.31 is therapeutic on lower end of therapeutic range on heparin infusion of 750 units/hr  Confirmed with RN that heparin infusing at correct rate. No interruptions in/issues with infusion and no signs/symptoms of bleeding or  bruising.  Goal of Therapy:  Heparin level 0.3-0.7 units/ml Monitor platelets by anticoagulation protocol: Yes   Plan:   No bolus per consult   Slightly increase heparin infusion to 800 units/hr  Check HL in 8 hours  HL and CBC daily while on heparin infusion  Continue to monitor for signs/symptoms of bleeding  Lenis Noon, PharmD 01/28/2019,3:21 PM

## 2019-01-28 NOTE — Progress Notes (Signed)
ANTICOAGULATION CONSULT NOTE - Initial Consult  Pharmacy Consult for Heparin Indication: atrial fibrillation  Allergies  Allergen Reactions  . Sulfonamide Derivatives     Patient Measurements: Height: 5\' 1"  (154.9 cm) Weight: 113 lb 5.1 oz (51.4 kg) IBW/kg (Calculated) : 47.8 Heparin Dosing Weight:   Vital Signs: Temp: 98.1 F (36.7 C) (09/11 0036) Temp Source: Oral (09/11 0036) BP: 103/51 (09/11 0036) Pulse Rate: 95 (09/11 0327)  Labs: Recent Labs    01/26/19 0157 01/26/19 0807 01/27/19 0502 01/28/19 0232  HGB 10.4*  --  10.2* 10.3*  HCT 33.9*  --  32.8* 33.3*  PLT 228  --  238 274  CREATININE 0.76  --  0.72 0.87  CKTOTAL  --  184  --   --   TROPONINIHS  --   --   --  1,589*    Estimated Creatinine Clearance: 35.7 mL/min (by C-G formula based on SCr of 0.87 mg/dL).   Medical History: Past Medical History:  Diagnosis Date  . Bronchiectasis   . COPD (chronic obstructive pulmonary disease) ([place])   . Degenerative disk disease   . Eczema   . Gastric ulcer   . GERD (gastroesophageal reflux disease)   . Hiatal hernia   . Hyperlipidemia   . Hypertension   . Hypothyroidism   . Osteoarthritis   . Polymyalgia rheumatica ([place])   . Pulmonary nodule, right   . Vertebrobasilar insufficiency     Medications:  Infusions:  . heparin      Assessment: Patient with afib and Elevated troponin.  Last dose enoxaparin 40mg  sq 9/10 at ~1700.  Goal of Therapy:  Heparin level 0.3-0.7 units/ml Monitor platelets by anticoagulation protocol: Yes   Plan:  D/C enoxaparin Heparin drip at 750 units/hr Heparin level at 1400 Daily CBC  Nani Skillern Crowford 01/28/2019,4:09 AM

## 2019-01-28 NOTE — Progress Notes (Signed)
CRITICAL VALUE ALERT  Critical Value:  Troponin 1589  Date & Time Notied:  01/28/19 0325  Provider Notified: Schorr NP  Orders Received/Actions taken: Pending

## 2019-01-28 NOTE — Progress Notes (Addendum)
[facility] Progress Note  Patient: Jenna Carrillo YPP:[id number]   PCP: Shon Baton, MD DOB: Sep 27, 1930   DOA: 01/24/2019   DOS: 01/28/2019   Date of Service: the patient was seen and examined on 01/28/2019  Chief Complaint  Patient presents with  . Fall   Brief hospital course: Pt. with PMH ofCOPD not on oxygen, HTN, HLD, hypothyroidism, polymyalgia rheumatica and possibly CKD-3, presentedafter mechanical fall episode at home, was found to havecommunity-acquired pneumonia.  On 01/24/2019 patient was hypotensive, transferred to stepdown unit, patient treated with antibiotics. Developed volume overload and acute CHF.  Given IV diuresis. On 01/28/2019 patient developed A. fib with RVR.  Echocardiogram shows low EF.  Cardiology consulted.  Heparin started.  Currently further plan is continue follow-up on rate control.  Continue heparin..  Subjective: Denies any acute complaint.  Cough is better.  No nausea or vomiting.  No fever no chills.  No active bleeding.  No fall or trauma no injury.  No confusion.  Assessment and Plan: 1.Sepsis, secondary to right lower lobe pneumonia.POA ? Aspiration. On admission patient was febrile, leukocytosis tachycardic, hypotensive. Initially admitted on the floor, transferred to stepdown unit.  Currently back on the floor. Blood cultures so far no growth. MRSA PCR negative. UA unremarkable, urine culture no growth. SARS-CoV-2 is negative. Procalcitonin 4.71, now trending down. CT chest does not suggestpresence of significant pneumonia or PE Chest x-ray reviewed personally. No acute worsening.  Treated with IV ceftriaxone and oral azithromycin.  Blood cultures so far negative.  Transitioning to oral Omnicef and azithromycin.  Continue.  2 Paroxysmal atrial fibrillation Acute systolic CHF-tachycardia induced cardiomyopathy. CHA2DS2-VASc Score 1 point each [CHF, HTN, DM, Vascular=MI/PAD/Aortic Plaque, Age if 3-74, or Female] 2 points each  [Age > 75, or Stroke/TIA/TE]  CHA2DS2/VAS 4 Overnight the patient went into RVR with A. fib requiring Cardizem and digoxin injections.  Patient currently on Coreg.  Currently normal sinus rhythm rate control. Echocardiogram shows acute systolic CHF. Treated with IV Lasix. Beta-blocker changed to Coreg from Lopressor. We will follow-up on recommendation of cardiology regarding anticoagulation.  Currently on IV heparin.  3.Acute metabolic encephalopathy. 4. Depression and insomnia. Agitation is resolved.  No further insomnia. This is likely secondary to delirium from infection and being in the hospital. Possibility of infection cannot be ruled out. CT head unremarkable. -Continue Seroquel as well as amitriptyline.  Probably will not require Seroquel on discharge.  4.Essential hypertension. Hypotension in the setting of tachycardia versus dehydration. At home the patient is on Toprol-XL 50 mg twice daily. Was given Lopressor here in the hospital. Currently changed to [place]. Briefly the patient was hypotensive requiring IV fluids and holding medications. May require amiodarone/digoxin if remains hypotensive with RVR.  5.Hypothyroidism. Iatrogenic hyperthyroidism. TSH suppressed.Elevatedfree T4. Reduce the dose of Synthroid from 88-75 MCG.  6.Recurrent fall Multiple sclerosis Primary presentation Mechanical fall at home. PT OT recommends SNF versus 24/7 monitoring. CK normal.  Social worker consulted for SNF. Discussed with family.  Patient and family would like patient to go home.  Home health arranged.  7.Polymyalgia rheumatica. Generalized body ache. ESR 57. Continue current dose of prednisone. No further generalized body ache.  8.COPD. Acute hypoxic respiratory failure Not on oxygenat home, oxygenation dropped to 85% on ambulation.  Currently on 3 L of oxygen. Continue inhalers. Breathing improved after diuresis.  Do not think the patient actually  has acute COPD exacerbation.  Diet: Cardiac diet  DVT Prophylaxis: Subcutaneous Lovenox  Advance goals of care discussion: DNR/DNI  Family  Communication: no family was present at bedside, at the time of interview. The pt provided permission to discuss medical plan with the family. Opportunity was given to ask question and all questions were answered satisfactorily.   Disposition:  Discharge to home with home health.  Consultants: Cardiology Procedures: Echocardiogram  Scheduled Meds: . amitriptyline  50 mg Oral QHS  . benzonatate  100 mg Oral BID  . carvedilol  12.5 mg Oral BID WC  . cefdinir  300 mg Oral Q12H  . Chlorhexidine Gluconate Cloth  6 each Topical Daily  . guaiFENesin  600 mg Oral BID  . levothyroxine  75 mcg Oral Q0600  . mouth rinse  15 mL Mouth Rinse BID  . predniSONE  3 mg Oral Daily  . QUEtiapine  12.5 mg Oral BID  . umeclidinium bromide  1 puff Inhalation Daily   Continuous Infusions: . heparin 800 Units/hr (01/28/19 1719)   PRN Meds: acetaminophen, ipratropium, levalbuterol, metoprolol tartrate Antibiotics: Anti-infectives (From admission, onward)   Start     Dose/Rate Route Frequency Ordered Stop   01/27/19 1700  cefdinir (OMNICEF) capsule 300 mg     300 mg Oral Every 12 hours 01/27/19 1417     01/26/19 1000  azithromycin (ZITHROMAX) tablet 500 mg     500 mg Oral Daily 01/26/19 0918 01/28/19 0925   01/24/19 1330  cefTRIAXone (ROCEPHIN) 2 g in sodium chloride 0.9 % 100 mL IVPB  Status:  Discontinued     2 g 200 mL/hr over 30 Minutes Intravenous Every 24 hours 01/24/19 1318 01/27/19 1417   01/24/19 1330  azithromycin (ZITHROMAX) 500 mg in sodium chloride 0.9 % 250 mL IVPB  Status:  Discontinued     500 mg 250 mL/hr over 60 Minutes Intravenous Every 24 hours 01/24/19 1318 01/26/19 0918       Objective: Physical Exam: Vitals:   01/28/19 0931 01/28/19 1036 01/28/19 1314 01/28/19 1722  BP:  (!) 90/48 (!) 111/54   Pulse:  84 91 (!) 104  Resp:  18 20    Temp:  98.5 F (36.9 C) 98.3 F (36.8 C)   TempSrc:  Oral Oral   SpO2: 91% 99% 100%   Weight:      Height:        Intake/Output Summary (Last 24 hours) at 01/28/2019 1726 Last data filed at 01/28/2019 1400 Gross per 24 hour  Intake 970.02 ml  Output 600 ml  Net 370.02 ml   Filed Weights   01/24/19 0933 01/24/19 1719  Weight: 59 kg 51.4 kg   General: alert and oriented to time, place, and person. Appear in moderate distress, affect appropriate Eyes: PERRL, Conjunctiva normal ENT: Oral Mucosa Clear, moist  Neck: no JVD, no Abnormal Mass Or lumps Cardiovascular: S1 and S2 Present, no Murmur, peripheral pulses symmetrical Respiratory: good respiratory effort, Bilateral Air entry equal and Decreased, no signs of accessory muscle use, bilateral  Crackles, no wheezes Abdomen: Bowel Sound present, Soft and no tenderness, no hernia Skin: no rashes  Extremities: no Pedal edema, no calf tenderness Neurologic: without any new focal findings Gait not checked due to patient safety concerns  Data Reviewed: I have personally reviewed and interpreted daily labs, tele strips, imagings as discussed above. I reviewed all nursing notes, pharmacy notes, vitals, pertinent old records I have discussed plan of care as described above with RN and patient/family.  CBC: Recent Labs  Lab 01/24/19 1413 01/25/19 0206 01/26/19 0157 01/27/19 0502 01/28/19 0232  WBC 21.3* 16.2* 17.2* 15.9* 13.2*  NEUTROABS 18.8*  --   --  13.1*  --   HGB 9.6* 9.5* 10.4* 10.2* 10.3*  HCT 31.1* 31.4* 33.9* 32.8* 33.3*  MCV 97.2 99.7 97.4 95.9 94.9  PLT 217 215 228 238 067   Basic Metabolic Panel: Recent Labs  Lab 01/24/19 2128 01/25/19 0206 01/26/19 0157 01/27/19 0502 01/28/19 0232  NA 138 139 137 139 139  K 3.5 3.8 3.7 3.2* 3.6  CL 108 111 105 102 101  CO2 '25 23 24 25 27  ' GLUCOSE 135* 114* 118* 122* 102*  BUN '15 12 9 11 14  ' CREATININE 0.79 0.71 0.76 0.72 0.87  CALCIUM 7.7* 7.7* 8.5* 8.0* 8.3*  MG  --    --   --  1.7 1.8    Liver Function Tests: Recent Labs  Lab 01/24/19 1105 01/27/19 0502  AST 30 47*  ALT 19 36  ALKPHOS 98 119  BILITOT 0.7 0.5  PROT 6.8 5.6*  ALBUMIN 3.3* 2.4*   No results for input(s): LIPASE, AMYLASE in the last 168 hours. No results for input(s): AMMONIA in the last 168 hours. Coagulation Profile: Recent Labs  Lab 01/25/19 0206  INR 1.6*   Cardiac Enzymes: Recent Labs  Lab 01/24/19 1554 01/26/19 0807  CKTOTAL 288* 184   BNP (last 3 results) No results for input(s): PROBNP in the last 8760 hours. CBG: No results for input(s): GLUCAP in the last 168 hours. Studies: No results found.   Time spent: 35 minutes  Author: Berle Mull, MD [facility] 01/28/2019 5:26 PM  To reach On-call, see care teams to locate the attending and reach out to them via [email or web address]. If 7PM-7AM, please contact night-coverage If you still have difficulty reaching the attending provider, please page the [facility] (Director on Call) for [facility] on amion for assistance.

## 2019-01-28 NOTE — TOC Progression Note (Signed)
Transition of Care [facility]) - Progression Note    Patient Details  Name: Jenna Carrillo MRN: [id number] Date of Birth: 1930-11-05  Transition of Care [facility]) CM/SW Contact  Tamiki Kuba, Juliann Pulse, RN Phone Number: 01/28/2019, 2:17 PM  Clinical Narrative: Patient/dtr [name]-declines SNF want Home w/[facility] rep Santiago Glad accepted referral. HHRN/PT/OT/aide/sw. Patient has rw. Patient sills tay w/dtr [name] @ d/c address [address] [place] [phone]. Family will transport home.      Expected Discharge Plan: [place] Barriers to Discharge: Continued Medical Work up  Expected Discharge Plan and Services Expected Discharge Plan: [place]   Discharge Planning Services: CM Consult   Living arrangements for the past 2 months: Single Family Home                           HH Arranged: RN, PT, OT, Nurse's Aide, Social Work [employer] Agency: [place] ([place]) Date [place]: 01/28/19 Time [place]: [place] Representative spoke with at [place]: [place] ([place]) Interventions    Readmission Risk Interventions No flowsheet data found.

## 2019-01-29 LAB — CBC
HCT: 32.2 % — ABNORMAL LOW (ref 36.0–46.0)
Hemoglobin: 10.3 g/dL — ABNORMAL LOW (ref 12.0–15.0)
MCH: 30.3 pg (ref 26.0–34.0)
MCHC: 32 g/dL (ref 30.0–36.0)
MCV: 94.7 fL (ref 80.0–100.0)
Platelets: 247 10*3/uL (ref 150–400)
RBC: 3.4 MIL/uL — ABNORMAL LOW (ref 3.87–5.11)
RDW: 14.3 % (ref 11.5–15.5)
WBC: 13.6 10*3/uL — ABNORMAL HIGH (ref 4.0–10.5)
nRBC: 0 % (ref 0.0–0.2)

## 2019-01-29 LAB — CULTURE, BLOOD (ROUTINE X 2)
Culture: NO GROWTH
Culture: NO GROWTH
Special Requests: ADEQUATE
Special Requests: ADEQUATE

## 2019-01-29 LAB — BASIC METABOLIC PANEL
Anion gap: 13 (ref 5–15)
BUN: 18 mg/dL (ref 8–23)
CO2: 28 mmol/L (ref 22–32)
Calcium: 8.6 mg/dL — ABNORMAL LOW (ref 8.9–10.3)
Chloride: 99 mmol/L (ref 98–111)
Creatinine, Ser: 0.89 mg/dL (ref 0.44–1.00)
GFR calc Af Amer: >60 mL/min (ref >60)
GFR calc non Af Amer: 59 mL/min — ABNORMAL LOW (ref >60)
Glucose, Bld: 80 mg/dL (ref 70–99)
Potassium: 3.5 mmol/L (ref 3.5–5.1)
Sodium: 140 mmol/L (ref 135–145)

## 2019-01-29 LAB — MAGNESIUM: Magnesium: 1.7 mg/dL (ref 1.7–2.4)

## 2019-01-29 LAB — BRAIN NATRIURETIC PEPTIDE: B Natriuretic Peptide: 1930.6 pg/mL — ABNORMAL HIGH (ref 0.0–100.0)

## 2019-01-29 MED ORDER — MAGNESIUM SULFATE 2 GM/50ML IV SOLN
2.0000 g | Freq: Once | INTRAVENOUS | Status: AC
  Administered 2019-01-29: 2 g via INTRAVENOUS
  Filled 2019-01-29: qty 50

## 2019-01-29 MED ORDER — POTASSIUM CHLORIDE CRYS ER 20 MEQ PO TBCR
40.0000 meq | EXTENDED_RELEASE_TABLET | Freq: Once | ORAL | Status: AC
  Administered 2019-01-29: 40 meq via ORAL
  Filled 2019-01-29: qty 2

## 2019-01-29 NOTE — Discharge Instructions (Signed)

## 2019-01-29 NOTE — Progress Notes (Signed)
[facility] Progress Note  Patient: Jenna Carrillo FYB:[id number]   PCP: Shon Baton, MD DOB: June 30, 1930   DOA: 01/24/2019   DOS: 01/29/2019   Date of Service: the patient was seen and examined on 01/29/2019  Chief Complaint  Patient presents with  . Fall   Brief hospital course: Pt. with PMH ofCOPD not on oxygen, HTN, HLD, hypothyroidism, polymyalgia rheumatica and possibly CKD-3, presentedafter mechanical fall episode at home, was found to havecommunity-acquired pneumonia.  On 01/24/2019 patient was hypotensive, transferred to stepdown unit, patient treated with antibiotics. Developed volume overload and acute CHF.  Given IV diuresis. On 01/28/2019 patient developed A. fib with RVR.  Echocardiogram shows low EF.  Cardiology consulted.  Heparin started.  Currently further plan is continue rate control.  Subjective: feeling better, somewhat short of breath.   Assessment and Plan: 1.Sepsis, secondary to right lower lobe pneumonia.POA ? Aspiration. Acute hypoxic respiratory failure On admission patient was febrile, leukocytosis tachycardic, hypotensive. Initially admitted on the floor, transferred to stepdown unit. Currently back on the floor. Blood cultures so far no growth. MRSA PCR negative. UA unremarkable, urine culture no growth. SARS-CoV-2 is negative. Procalcitonin 4.71, now trending down. CT chest does not suggestpresence of significant pneumonia or PE Chest x-ray reviewed personally. No acute worsening. Treated with IV ceftriaxone and oral azithromycin.   Blood cultures so far negative.   Continue oral antibiotics.  Still hypoxic. Likely will require oxygen at the time of the discharge.  2 Paroxysmal atrial fibrillation Acute systolic CHF-tachycardia induced cardiomyopathy. CHA2DS2-VASc Score 1 point each [CHF, HTN, DM, Vascular=MI/PAD/Aortic Plaque, Age if 42-74, or Female] 2 points each [Age > 75, or Stroke/TIA/TE]  CHA2DS2/VAS 4 Patient was initially  on Lopressor. Echocardiogram shows acute systolic CHF. Beta-blocker changed to Coreg from Lopressor. [place] cardiology consultation. Cardiology currently recommends to continue anticoagulation with Eliquis 2.5 mg twice daily. Continue rate control with Coreg 12.5 mg twice daily. If her blood pressure allows cardiology also recommends to add lisinopril 5 mg daily. Lasix as needed for diuresis  3.Acute metabolic encephalopathy. 4. Depression and insomnia. Agitation is resolved.  No further insomnia. This is likely secondary to delirium from infection and being in the hospital. Possibility of infection cannot be ruled out. CT head unremarkable. -Continue Seroquel as well as amitriptyline.  Probably will not require Seroquel on discharge.  4.Essential hypertension. Hypotension in the setting of tachycardia versus dehydration. At home the patient is on Toprol-XL 50 mg twice daily. Was given Lopressor here in the hospital. Currently changed to [place]. Briefly the patient was hypotensive requiring IV fluids and holding medications. Blood pressure currently well controlled on Coreg.  Monitor.  5.Hypothyroidism. Iatrogenic hyperthyroidism. TSH suppressed.Elevatedfree T4. Reduce the dose of Synthroid from 88-75 MCG.  6.Recurrent fall Multiple sclerosis Primary presentation Mechanical fall at home. PT OT recommends SNF versus 24/7 monitoring. CK normal. Social worker consulted for SNF. Discussed with family.  Patient and family would like patient to go home. Home health arranged.  7.Polymyalgia rheumatica. Generalized body ache. ESR 57. Continue current dose of prednisone. No further generalized body ache.  8.COPD. Acute hypoxic respiratory failure Not on oxygenat home, oxygenation dropped to 85% on ambulation.  Currently on 3 L of oxygen. Continue inhalers. Breathing improved after diuresis.  Do not think the patient actually has acute COPD  exacerbation.  Diet: Cardiac diet  DVT Prophylaxis: Therapeutic Anticoagulation with apixaban  Advance goals of care discussion: DNR DNI  Family Communication: no family was present at bedside, at the time of interview.  The pt provided permission to discuss medical plan with the family. Opportunity was given to ask question and all questions were answered satisfactorily.   Disposition:  Discharge to home tomorrow.  Consultants: cardiology  Procedures: Echocardiogram   Scheduled Meds: . amitriptyline  50 mg Oral QHS  . apixaban  2.5 mg Oral BID  . benzonatate  100 mg Oral BID  . carvedilol  12.5 mg Oral BID WC  . cefdinir  300 mg Oral Q12H  . Chlorhexidine Gluconate Cloth  6 each Topical Daily  . guaiFENesin  600 mg Oral BID  . levothyroxine  75 mcg Oral Q0600  . mouth rinse  15 mL Mouth Rinse BID  . predniSONE  3 mg Oral Daily  . QUEtiapine  12.5 mg Oral BID  . umeclidinium bromide  1 puff Inhalation Daily   Continuous Infusions: PRN Meds: acetaminophen, ipratropium, levalbuterol, metoprolol tartrate Antibiotics: Anti-infectives (From admission, onward)   Start     Dose/Rate Route Frequency Ordered Stop   01/27/19 1700  cefdinir (OMNICEF) capsule 300 mg     300 mg Oral Every 12 hours 01/27/19 1417     01/26/19 1000  azithromycin (ZITHROMAX) tablet 500 mg     500 mg Oral Daily 01/26/19 0918 01/28/19 0925   01/24/19 1330  cefTRIAXone (ROCEPHIN) 2 g in sodium chloride 0.9 % 100 mL IVPB  Status:  Discontinued     2 g 200 mL/hr over 30 Minutes Intravenous Every 24 hours 01/24/19 1318 01/27/19 1417   01/24/19 1330  azithromycin (ZITHROMAX) 500 mg in sodium chloride 0.9 % 250 mL IVPB  Status:  Discontinued     500 mg 250 mL/hr over 60 Minutes Intravenous Every 24 hours 01/24/19 1318 01/26/19 0918       Objective: Physical Exam: Vitals:   01/29/19 0755 01/29/19 0911 01/29/19 0912 01/29/19 1253  BP: 137/62 137/68 137/68 (!) 114/52  Pulse: 96 (!) 101 (!) 101 94  Resp: (!)  24   20  Temp: 99.7 F (37.6 C)   98.9 F (37.2 C)  TempSrc: Oral   Oral  SpO2: 99%   98%  Weight:      Height:        Intake/Output Summary (Last 24 hours) at 01/29/2019 1407 Last data filed at 01/29/2019 0908 Gross per 24 hour  Intake 120 ml  Output 600 ml  Net -480 ml   Filed Weights   01/24/19 0933 01/24/19 1719  Weight: 59 kg 51.4 kg   General: alert and oriented to time, place, and person. Appear in mild distress, affect appropriate Eyes: PERRL, Conjunctiva normal ENT: Oral Mucosa Clear, moist  Neck: NO JVD, NO Abnormal Mass Or lumps Cardiovascular: S1 and S2 Present, NO Murmur, peripheral pulses symmetrical Respiratory: good respiratory effort, Bilateral Air entry equal and Decreased, no signs of accessory muscle use, bilateral  Crackles, no wheezes Abdomen: Bowel Sound present, Soft and no tenderness, no hernia Skin: no rashes  Extremities: no Pedal edema, no calf tenderness Neurologic: without any new focal findings Gait not checked due to patient safety concerns   Data Reviewed: I have personally reviewed and interpreted daily labs, tele strips, imagings as discussed above. I reviewed all nursing notes, pharmacy notes, vitals, pertinent old records I have discussed plan of care as described above with RN and patient/family.  CBC: Recent Labs  Lab 01/24/19 1413 01/25/19 0206 01/26/19 0157 01/27/19 0502 01/28/19 0232 01/29/19 0522  WBC 21.3* 16.2* 17.2* 15.9* 13.2* 13.6*  NEUTROABS 18.8*  --   --  13.1*  --   --   HGB 9.6* 9.5* 10.4* 10.2* 10.3* 10.3*  HCT 31.1* 31.4* 33.9* 32.8* 33.3* 32.2*  MCV 97.2 99.7 97.4 95.9 94.9 94.7  PLT 217 215 228 238 274 530   Basic Metabolic Panel: Recent Labs  Lab 01/25/19 0206 01/26/19 0157 01/27/19 0502 01/28/19 0232 01/29/19 0522  NA 139 137 139 139 140  K 3.8 3.7 3.2* 3.6 3.5  CL 111 105 102 101 99  CO2 '23 24 25 27 28  ' GLUCOSE 114* 118* 122* 102* 80  BUN '12 9 11 14 18  ' CREATININE 0.71 0.76 0.72 0.87 0.89   CALCIUM 7.7* 8.5* 8.0* 8.3* 8.6*  MG  --   --  1.7 1.8 1.7    Liver Function Tests: Recent Labs  Lab 01/24/19 1105 01/27/19 0502  AST 30 47*  ALT 19 36  ALKPHOS 98 119  BILITOT 0.7 0.5  PROT 6.8 5.6*  ALBUMIN 3.3* 2.4*   No results for input(s): LIPASE, AMYLASE in the last 168 hours. No results for input(s): AMMONIA in the last 168 hours. Coagulation Profile: Recent Labs  Lab 01/25/19 0206  INR 1.6*   Cardiac Enzymes: Recent Labs  Lab 01/24/19 1554 01/26/19 0807  CKTOTAL 288* 184   BNP (last 3 results) No results for input(s): PROBNP in the last 8760 hours. CBG: No results for input(s): GLUCAP in the last 168 hours. Studies: No results found.   Time spent: 35 minutes  Author: Berle Mull, MD [facility] 01/29/2019 2:07 PM  To reach On-call, see care teams to locate the attending and reach out to them via [email or web address]. If 7PM-7AM, please contact night-coverage If you still have difficulty reaching the attending provider, please page the [facility] (Director on Call) for [facility] on amion for assistance.

## 2019-01-29 NOTE — Progress Notes (Signed)
Cardiology Progress Note  Patient ID: Jenna Carrillo MRN: [id number] DOB: Nov 16, 1930 Date of Encounter: 01/29/2019  Primary Cardiologist: No primary care provider on file.  Subjective  No acute events overnight.  She is without complaints this a.m.  She denies chest pain, shortness of breath or lower extremity edema.  She was up in the chair eating breakfast at the time of interview.  She denies symptoms.  ROS:  All other ROS reviewed and negative. Pertinent positives noted in the HPI.     Inpatient Medications  Scheduled Meds: . amitriptyline  50 mg Oral QHS  . apixaban  2.5 mg Oral BID  . benzonatate  100 mg Oral BID  . carvedilol  12.5 mg Oral BID WC  . cefdinir  300 mg Oral Q12H  . Chlorhexidine Gluconate Cloth  6 each Topical Daily  . guaiFENesin  600 mg Oral BID  . levothyroxine  75 mcg Oral Q0600  . mouth rinse  15 mL Mouth Rinse BID  . predniSONE  3 mg Oral Daily  . QUEtiapine  12.5 mg Oral BID  . umeclidinium bromide  1 puff Inhalation Daily   Continuous Infusions: . magnesium sulfate bolus IVPB 2 g (01/29/19 0917)   PRN Meds: acetaminophen, ipratropium, levalbuterol, metoprolol tartrate   Vital Signs   Vitals:   01/29/19 0411 01/29/19 0736 01/29/19 0755 01/29/19 0911  BP: 118/61  137/62 137/68  Pulse: 100 (!) 104 96 (!) 101  Resp: (!) 22 16 (!) 24   Temp: 98.9 F (37.2 C)  99.7 F (37.6 C)   TempSrc: Oral  Oral   SpO2: 95% 96% 99%   Weight:      Height:        Intake/Output Summary (Last 24 hours) at 01/29/2019 0920 Last data filed at 01/29/2019 0908 Gross per 24 hour  Intake 179.88 ml  Output 1000 ml  Net -820.12 ml   Last 3 Weights 01/24/2019 01/24/2019 03/09/2018  Weight (lbs) 113 lb 5.1 oz 130 lb 130 lb  Weight (kg) 51.4 kg 58.968 kg 58.968 kg      Telemetry  Overnight telemetry shows normal sinus rhythm with heart rate in the 78 range, she did have episodes of sinus tachycardia when getting up this morning, which I personally reviewed.   ECG   No recent EKG.  Physical Exam   Vitals:   01/29/19 0411 01/29/19 0736 01/29/19 0755 01/29/19 0911  BP: 118/61  137/62 137/68  Pulse: 100 (!) 104 96 (!) 101  Resp: (!) 22 16 (!) 24   Temp: 98.9 F (37.2 C)  99.7 F (37.6 C)   TempSrc: Oral  Oral   SpO2: 95% 96% 99%   Weight:      Height:         Intake/Output Summary (Last 24 hours) at 01/29/2019 0920 Last data filed at 01/29/2019 0908 Gross per 24 hour  Intake 179.88 ml  Output 1000 ml  Net -820.12 ml    Last 3 Weights 01/24/2019 01/24/2019 03/09/2018  Weight (lbs) 113 lb 5.1 oz 130 lb 130 lb  Weight (kg) 51.4 kg 58.968 kg 58.968 kg    Body mass index is 21.41 kg/m.  General: Well nourished, well developed, in no acute distress Heart: Atraumatic, normal size  Eyes: PEERLA, EOMI  Neck: Supple, no JVD Endocrine: No thryomegaly Cardiac: Normal S1, S2; RRR; no murmurs, rubs, or gallops Lungs: Diminished breath sounds at lung bases bilaterally Abd: Soft, nontender, no hepatomegaly  Ext: No edema, pulses 2+ Musculoskeletal: No  deformities, BUE and BLE strength normal and equal Skin: Warm and dry, no rashes   Neuro: Alert and oriented to person, place, time, and situation, CNII-XII grossly intact, no focal deficits  Psych: Normal mood and affect   Labs  High Sensitivity Troponin:   Recent Labs  Lab 01/28/19 0232 01/28/19 0425  TROPONINIHS 1,589* 1,653*     Cardiac EnzymesNo results for input(s): TROPONINI in the last 168 hours. No results for input(s): TROPIPOC in the last 168 hours.  Chemistry Recent Labs  Lab 01/24/19 1105  01/27/19 0502 01/28/19 0232 01/29/19 0522  NA 137   < > 139 139 140  K 4.0   < > 3.2* 3.6 3.5  CL 98   < > 102 101 99  CO2 28   < > 25 27 28   GLUCOSE 116*   < > 122* 102* 80  BUN 18   < > 11 14 18   CREATININE 0.96   < > 0.72 0.87 0.89  CALCIUM 8.8*   < > 8.0* 8.3* 8.6*  PROT 6.8  --  5.6*  --   --   ALBUMIN 3.3*  --  2.4*  --   --   AST 30  --  47*  --   --   ALT 19  --  36  --   --    ALKPHOS 98  --  119  --   --   BILITOT 0.7  --  0.5  --   --   GFRNONAA 54*   < > >60 >60 59*  GFRAA >60   < > >60 >60 >60  ANIONGAP 11   < > 12 11 13    < > = values in this interval not displayed.    Hematology Recent Labs  Lab 01/27/19 0502 01/28/19 0232 01/29/19 0522  WBC 15.9* 13.2* 13.6*  RBC 3.42* 3.51* 3.40*  HGB 10.2* 10.3* 10.3*  HCT 32.8* 33.3* 32.2*  MCV 95.9 94.9 94.7  MCH 29.8 29.3 30.3  MCHC 31.1 30.9 32.0  RDW 13.8 14.0 14.3  PLT 238 274 247   BNP Recent Labs  Lab 01/29/19 0522  BNP 1,930.6*    DDimer  Recent Labs  Lab 01/24/19 1554  DDIMER 2.49*     Radiology  No results found.  Cardiac Studies  ECHO: 01/28/2019 1. Severe hypokinesis of the left ventricular, mid-apical anteroseptal wall, anterior wall and apical segment. 2. The left ventricle has mild-moderately reduced systolic function, with an ejection fraction of 40-45%. The cavity size was normal. Left ventricular diastolic Doppler parameters are consistent with pseudonormalization. Elevated mean left atrial pressure. 3. The right ventricle has normal systolic function. The cavity was normal. There is no increase in right ventricular wall thickness. Right ventricular systolic pressure is severely elevated. 4. Left atrial size was mildly dilated. R atrium normal 5. Moderate pleural effusion in the left lateral region. 6. The mitral valve is degenerative. Moderate thickening of the mitral valve leaflet. Moderate calcification of the mitral valve leaflet. There is moderate mitral annular calcification present. Mild mitral valve stenosis. 7. Tricuspid valve regurgitation is mild-moderate. 8. Mild thickening of the aortic valve. 9. The aorta is not well visualized unless otherwise noted. 10. The inferior vena cava was normal in size with <50% respiratory variability. 11. No intracardiac thrombi or masses were visualized.  Patient Profile  Jenna Carrillo is a 85 y.o. female with history  of hypertension, hyperlipidemia, hypothyroidism, GERD, COPD, polymyalgia rheumatica, CKD who was admitted for sepsis secondary  to pneumonia.  Cardiology has been asked to evaluate a new reduced ejection fraction in the setting of elevated troponin, as well as paroxysmal atrial fibrillation.  Assessment & Plan  1.  Paroxysmal atrial fibrillation in the setting of acute illness -Review of telemetry just demonstrates normal sinus rhythm, with intermittent sinus tachycardia -It is promising that she is remained in normal sinus rhythm after her acute illness -I think for now we will continue her carvedilol 12.5 mg twice daily -We will continue her Eliquis 2.5 mg twice daily for now, and we can have a discussion about continue this long-term on an outpatient basis  2.  New onset systolic heart failure, stress-induced cardiomyopathy, non-myocardial infarction troponin elevation -Her overall picture is inconsistent with an acute LAD infarct, and more likely consistent with a stress-induced cardiomyopathy in the setting of critical illness as detailed by the cardiology note from yesterday -The best course of action is to continue her beta-blocker, and add ACE inhibitor or ARB.  It would be okay to start 5 mg of lisinopril as long as blood pressure will tolerate this. -She does not appear to have acute decompensated heart failure and is euvolemic on exam.  Her BNP was noted to be 1900, but she appears relatively stable. -We will continue to assess her need for diuretic therapy, but I would not recommend any at this time -Overall, moving forward the best course of action is to get her started on a beta-blocker and ACE inhibitor as above, and to repeat an echocardiogram in a few months    For questions or updates, please contact [facility] Please consult [email or web address] for contact info under        Signed, Gerri Spore T. Flora Lipps, MD [facility]  [facility]  01/29/2019 9:20 AM

## 2019-01-29 NOTE — Progress Notes (Signed)
Physical Therapy Treatment Patient Details Name: Jenna Carrillo MRN: [id number] DOB: 1931-04-21 Today's Date: 01/29/2019     SATURATION QUALIFICATIONS: (This note is used to comply with regulatory documentation for home oxygen)  Patient Saturations on Room Air at Rest = 89%  Patient Saturations on Room Air while Ambulating = 84%  Patient Saturations on 2 Liters of oxygen while Ambulating = 95%     History of Present Illness 85 yo female admitted with sepsis, fall, tachycardia. Hx of COPD, OA, polymyalgia rheumatica, cognitive deficits (? dementia)    PT Comments    Progressing with mobility. No family present during session on today. Per chart, pt's family refuses placement so will recommend HHPT/24 hour supervision/assist.    Follow Up Recommendations  Supervision/Assistance - 24 hour;Home health PT(SNF recommended but family declines placement so will need HHPT)     Equipment Recommendations  None recommended by PT    Recommendations for Other Services       Precautions / Restrictions Precautions Precautions: Fall Precaution Comments: monitor O2 Restrictions Weight Bearing Restrictions: No    Mobility  Bed Mobility               General bed mobility comments: oob in recliner  Transfers Overall transfer level: Needs assistance Equipment used: Rolling walker (2 wheeled) Transfers: Sit to/from Stand Sit to Stand: Min assist         General transfer comment: assistaance to rise, stabilize, control descent. vcs safety, hand placement.  Ambulation/Gait Ambulation/Gait assistance: Min assist Gait Distance (Feet): 75 Feet Assistive device: Rolling walker (2 wheeled) Gait Pattern/deviations: Step-through pattern;Decreased stride length     General Gait Details: assist to stabilize throughout distance and to manage RW intermittently. pt tolerated distance well but required O2   Stairs             Wheelchair Mobility    Modified Rankin  (Stroke Patients Only)       Balance Overall balance assessment: Needs assistance         Standing balance support: Bilateral upper extremity supported Standing balance-Leahy Scale: Poor                              Cognition Arousal/Alertness: Awake/alert Behavior During Therapy: WFL for tasks assessed/performed Overall Cognitive Status: Within Functional Limits for tasks assessed                                        Exercises      General Comments        Pertinent Vitals/Pain Pain Assessment: No/denies pain    Home Living                      Prior Function            PT Goals (current goals can now be found in the care plan section) Progress towards PT goals: Progressing toward goals    Frequency    Min 3X/week      PT Plan Current plan remains appropriate    Co-evaluation              AM-PAC PT "6 Clicks" Mobility   Outcome Measure  Help needed turning from your back to your side while in a flat bed without using bedrails?: A Little Help needed moving from lying on your back to  sitting on the side of a flat bed without using bedrails?: A Little Help needed moving to and from a bed to a chair (including a wheelchair)?: A Little Help needed standing up from a chair using your arms (e.g., wheelchair or bedside chair)?: A Little Help needed to walk in hospital room?: A Little Help needed climbing 3-5 steps with a railing? : A Lot 6 Click Score: 17    End of Session Equipment Utilized During Treatment: Gait belt;Oxygen Activity Tolerance: Patient tolerated treatment well Patient left: in chair;with call bell/phone within reach;with chair alarm set   PT Visit Diagnosis: Unsteadiness on feet (R26.81);History of falling (Z91.81);Muscle weakness (generalized) (M62.81)     Time: 0277-4128 PT Time Calculation (min) (ACUTE ONLY): 22 min  Charges:  $Gait Training: 8-22 mins                       Weston Anna, PT Acute Rehabilitation Services Pager: [phone] Office: [phone]

## 2019-01-30 ENCOUNTER — Other Ambulatory Visit: Payer: Self-pay | Admitting: Internal Medicine

## 2019-01-30 LAB — BASIC METABOLIC PANEL
Anion gap: 11 (ref 5–15)
BUN: 18 mg/dL (ref 8–23)
CO2: 31 mmol/L (ref 22–32)
Calcium: 8.5 mg/dL — ABNORMAL LOW (ref 8.9–10.3)
Chloride: 99 mmol/L (ref 98–111)
Creatinine, Ser: 0.89 mg/dL (ref 0.44–1.00)
GFR calc Af Amer: >60 mL/min (ref >60)
GFR calc non Af Amer: 59 mL/min — ABNORMAL LOW (ref >60)
Glucose, Bld: 110 mg/dL — ABNORMAL HIGH (ref 70–99)
Potassium: 3.5 mmol/L (ref 3.5–5.1)
Sodium: 141 mmol/L (ref 135–145)

## 2019-01-30 LAB — CBC
HCT: 32.7 % — ABNORMAL LOW (ref 36.0–46.0)
Hemoglobin: 10.2 g/dL — ABNORMAL LOW (ref 12.0–15.0)
MCH: 30.1 pg (ref 26.0–34.0)
MCHC: 31.2 g/dL (ref 30.0–36.0)
MCV: 96.5 fL (ref 80.0–100.0)
Platelets: 376 10*3/uL (ref 150–400)
RBC: 3.39 MIL/uL — ABNORMAL LOW (ref 3.87–5.11)
RDW: 14.2 % (ref 11.5–15.5)
WBC: 14.1 10*3/uL — ABNORMAL HIGH (ref 4.0–10.5)
nRBC: 0 % (ref 0.0–0.2)

## 2019-01-30 LAB — MAGNESIUM: Magnesium: 2.3 mg/dL (ref 1.7–2.4)

## 2019-01-30 MED ORDER — CEFDINIR 300 MG PO CAPS: 300.0000 mg | ORAL_CAPSULE | Freq: Two times a day (BID) | ORAL | 0 refills | Status: AC

## 2019-01-30 MED ORDER — LISINOPRIL 5 MG PO TABS
5.0000 mg | ORAL_TABLET | Freq: Every day | ORAL | Status: DC
  Administered 2019-01-30: 5 mg via ORAL
  Filled 2019-01-30: qty 1

## 2019-01-30 MED ORDER — LEVOTHYROXINE SODIUM 75 MCG PO TABS: 75.0000 ug | ORAL_TABLET | Freq: Every day | ORAL | 0 refills | Status: AC

## 2019-01-30 MED ORDER — LEVALBUTEROL TARTRATE 45 MCG/ACT IN AERO: 1.0000 | INHALATION_SPRAY | RESPIRATORY_TRACT | 0 refills | Status: DC | PRN

## 2019-01-30 MED ORDER — QUETIAPINE FUMARATE 25 MG PO TABS: 12.5000 mg | ORAL_TABLET | Freq: Every day | ORAL | 0 refills | Status: DC

## 2019-01-30 MED ORDER — POTASSIUM CHLORIDE CRYS ER 20 MEQ PO TBCR
40.0000 meq | EXTENDED_RELEASE_TABLET | Freq: Once | ORAL | Status: AC
  Administered 2019-01-30: 40 meq via ORAL
  Filled 2019-01-30: qty 2

## 2019-01-30 MED ORDER — FUROSEMIDE 20 MG PO TABS: 20.0000 mg | ORAL_TABLET | Freq: Every day | ORAL | 0 refills | Status: DC | PRN

## 2019-01-30 MED ORDER — APIXABAN 2.5 MG PO TABS: 2.5000 mg | ORAL_TABLET | Freq: Two times a day (BID) | ORAL | 0 refills | Status: DC

## 2019-01-30 MED ORDER — LISINOPRIL 5 MG PO TABS: 5.0000 mg | ORAL_TABLET | Freq: Every day | ORAL | 0 refills | Status: DC

## 2019-01-30 MED ORDER — CARVEDILOL 12.5 MG PO TABS: 12.5000 mg | ORAL_TABLET | Freq: Two times a day (BID) | ORAL | 0 refills | Status: DC

## 2019-01-30 MED ORDER — BENZONATATE 200 MG PO CAPS: 200.0000 mg | ORAL_CAPSULE | Freq: Three times a day (TID) | ORAL | 0 refills | Status: DC | PRN

## 2019-01-30 MED ORDER — GUAIFENESIN ER 600 MG PO TB12: 600.0000 mg | ORAL_TABLET | Freq: Two times a day (BID) | ORAL | 0 refills | Status: DC

## 2019-01-30 NOTE — Progress Notes (Signed)
Unable to sign cosign for SBAR. Bedside report completed.Cont with plan of care

## 2019-01-30 NOTE — Discharge Summary (Signed)
[facility] Discharge Summary   Patient: Jenna Carrillo OVF:[id number]   PCP: Shon Baton, MD DOB: 10-26-1930   Date of admission: 01/24/2019   Date of discharge: 01/30/2019     Discharge Diagnoses:  Principal Problem:   Sepsis due to pneumonia [facility]) Active Problems:   Essential hypertension   COPD (chronic obstructive pulmonary disease) ([place])   Polymyalgia rheumatica ([place])   CAP (community acquired pneumonia)   Current chronic use of systemic steroids   Encephalopathy   Fall from slip, trip, or stumble, initial encounter   Generalized weakness   Atrial fibrillation ([place])   Acute systolic heart failure ([place])  Admitted From: home Disposition:  Home  Family refused SNF  Recommendations for Outpatient Follow-up:  1. PCP: please monitor blood pressure ans discuss the regimen. Also monitor nutrition  2. Follow up LABS/TEST:  BMP in 1 week, repeat TSH and free T4 in 4 weeks.  Follow-up Information    [facility].   Specialty: [place] Why: [place] nursing/physicaltherapy/occupational therapy/aide/social worker       Shon Baton, MD. Schedule an appointment as soon as possible for a visit in 1 week(s).   Specialty: Internal Medicine Contact information: [address] [place] [address] [phone]          Diet recommendation: Cardiac diet  Activity: The patient is advised to gradually reintroduce usual activities,as tolerated .  Discharge Condition: good  Code Status: DNR   History of present illness: As per the H and P dictated on admission, "Jenna Carrillo is a 85 y.o. female with history of COPD not on oxygen, HTN, HLD, hypothyroidism, polymyalgia rheumatica and possibly CKD-3 after mechanical fall episode at home.  Patient appears confused and slightly agitated fighting with the pulse ox wire.  However, she follows some command.  History obtained from patient's daughter, Ms. Weeks over the phone.   Per  patient's daughter, patient lives with her son who is bedridden from [place].  She was found wedged between the toilet and wall yesterday morning about 8 AM when the caregiver went into check on her.  It is unclear how long she has been on ground.  She may have fallen at 6 AM per his son's report to Ms. Weeks. They called EMS.  She only had a little bit of bruise on her elbow.  She refused to come to the hospital.  Patient had another fall about 8 AM this morning while trying to change into pair of pants.  She fell back and hit her head on the corner of the dresser.  Has some bleeding.  No loss of consciousness.  EMS called and brought to ED.  Patient's daughter denies fever, cold symptoms, nausea, vomiting, facial droop, focal weakness or speech change.  She has chronic cough which is at baseline.  Patient was recently treated for UTI by PCP and symptoms resolved after treatment.  Per daughter, mental status is basically appropriate for age except for occasionally spacing out at baseline.  She is from a smoker.  Denies alcohol recreational drug use.  Daughter thinks patient needs 24/7 and is planning to move her in."  Hospital Course:  Summary of her active problems in the hospital is as following. 1.Sepsis, secondary to right lower lobe pneumonia.POA ? Aspiration. Acute hypoxic respiratory failure On admission patient was febrile, leukocytosis tachycardic, hypotensive. Initially admitted on the floor, transferred to stepdown unit. Currently back on the floor. Blood cultures so far no growth. MRSA PCR negative.  UA unremarkable, urine culture no growth. SARS-CoV-2 is negative. Procalcitonin 4.71, now trending down. CT chest does not suggestpresence of significant pneumonia or PE Chest x-ray reviewed personally. No acute worsening. Treated with IV ceftriaxone and oral azithromycin.  Blood cultures so far negative.  Continue oral antibiotics.  Still hypoxic.  Arrange oxygen at  discharge.  2Paroxysmal atrial fibrillation Acute systolic CHF-tachycardia induced cardiomyopathy. CHA2DS2-VASc Score 1 point each [CHF, HTN, DM, Vascular=MI/PAD/Aortic Plaque, Age if 38-74, or Female] 2 points each [Age >75, or Stroke/TIA/TE] CHA2DS2/VAS4 Patient was initially on Lopressor. Echocardiogram shows acute systolic CHF. Beta-blocker changed to Coreg from Lopressor. [place] cardiology consultation. Cardiology currently recommends to continue anticoagulation with Eliquis 2.5 mg twice daily. Continue rate control with Coreg 12.5 mg twice daily.  Started on lisinopril 5 mg daily by cardiology which we will continue on discharge as well. Lasix as needed for diuresis  3.Acute metabolic encephalopathy. 4. Depression and insomnia. Agitation is resolved. No further insomnia. This is likely secondary to delirium from infection and being in the hospital. Possibility of infection cannot be ruled out. CT head unremarkable. -Continue Seroquel as well as amitriptyline. We will reduce the dose of the Seroquel from twice daily to daily and continue for short course on discharge.  4.Essential hypertension. Hypotension in the setting of tachycardia versus dehydration. At home the patient is on Toprol-XL 50 mg twice daily. Was given Lopressor here in the hospital. Currently changed to [place]. Briefly the patient was hypotensive requiring IV fluids and holding medications. Blood pressure currently well controlled on Coreg.  Lisinopril added.  Repeat BMP in 1 week.  5.Hypothyroidism. Iatrogenic hyperthyroidism. TSH suppressed.Elevatedfree T4. Reduce the dose of Synthroid from 88-75 MCG. Repeat TSH and T4 follow-up in 4 weeks.  6.Recurrent fall Multiple sclerosis Primary presentation Mechanical fall at home. PT OT recommends SNF versus 24/7 monitoring. CK normal. Social worker consulted for SNF. Discussed with family. Patient and family would like patient to go  home. Home health arranged.  7.Polymyalgia rheumatica. Generalized body ache. ESR 57. Continue current dose of prednisone. No further generalized body ache.  8.COPD. Acute hypoxic respiratory failure Not on oxygenat home, oxygenation dropped to 85% on ambulation. Currently on 3 L of oxygen. Continue inhalers. Breathing improved after diuresis. Do not think the patient actually has acute COPD exacerbation.  Patient was seen by physical therapy, who recommended SNF, patient and family refused SNF and wanted to go home instead, home health was recommended, which was arranged by case Freight forwarder. On the day of the discharge the patient's vitals were stable, and no other acute medical condition were reported by patient. the patient was felt safe to be discharge at Home with Home health.  Consultants: cardiology  Procedures: Echocardiogram   DISCHARGE MEDICATION: Allergies as of 01/30/2019      Reactions   Sulfonamide Derivatives       Medication List    STOP taking these medications   metoprolol succinate 50 MG 24 hr tablet Commonly known as: TOPROL-XL   nitrofurantoin (macrocrystal-monohydrate) 100 MG capsule Commonly known as: MACROBID   ProAir HFA 108 (90 Base) MCG/ACT inhaler Generic drug: albuterol     TAKE these medications   amitriptyline 50 MG tablet Commonly known as: ELAVIL Take 50 mg by mouth at bedtime.   apixaban 2.5 MG Tabs tablet Commonly known as: ELIQUIS Take 1 tablet (2.5 mg total) by mouth 2 (two) times daily.   benzonatate 200 MG capsule Commonly known as: TESSALON Take 1 capsule (200 mg total) by mouth 3 (  three) times daily as needed for cough.   carvedilol 12.5 MG tablet Commonly known as: COREG Take 1 tablet (12.5 mg total) by mouth 2 (two) times daily with a meal.   cefdinir 300 MG capsule Commonly known as: OMNICEF Take 1 capsule (300 mg total) by mouth 2 (two) times daily for 2 doses.   guaiFENesin 600 MG 12 hr tablet Commonly  known as: MUCINEX Take 1 tablet (600 mg total) by mouth 2 (two) times daily.   levalbuterol 45 MCG/ACT inhaler Commonly known as: XOPENEX HFA Inhale 1 puff into the lungs every 4 (four) hours as needed for wheezing.   levothyroxine 75 MCG tablet Commonly known as: SYNTHROID Take 1 tablet (75 mcg total) by mouth daily at 6 (six) AM. Start taking on: January 31, 2019 What changed:   medication strength  how much to take  when to take this   lisinopril 5 MG tablet Commonly known as: ZESTRIL Take 1 tablet (5 mg total) by mouth daily. Start taking on: January 31, 2019   multivitamin tablet Take 1 tablet by mouth daily.   omeprazole 20 MG capsule Commonly known as: PRILOSEC Take 20 mg by mouth daily as needed (acid reflux).   predniSONE 1 MG tablet Commonly known as: DELTASONE Take 3 mg by mouth daily.   QUEtiapine 25 MG tablet Commonly known as: SEROQUEL Take 0.5 tablets (12.5 mg total) by mouth at bedtime.   Spiriva HandiHaler 18 MCG inhalation capsule Generic drug: tiotropium PLACE 1 CAPSULE (18 MCG TOTAL) INTO INHALER AND INHALE DAILY What changed: See the new instructions.            Durable Medical Equipment  (From admission, onward)         Start     Ordered   01/29/19 1424  For home use only DME oxygen  Once    Question Answer Comment  Length of Need Lifetime   Mode or (Route) Nasal cannula   Liters per Minute 2   Frequency Continuous (stationary and portable oxygen unit needed)   Oxygen delivery system Gas      01/29/19 1423         Allergies  Allergen Reactions  . Sulfonamide Derivatives    Discharge Instructions    Diet - low sodium heart healthy   Complete by: As directed    Discharge instructions   Complete by: As directed    It is important that you read the given instructions as well as go over your medication list with RN to help you understand your care after this hospitalization.  Please follow-up with PCP in 1-2 weeks.   Please note that NO REFILLS for any discharge medications will be authorized once you are discharged, as it is imperative that you return to your primary care physician (or establish a relationship with a primary care physician if you do not have one) for your aftercare needs so that they can reassess your need for medications and monitor your lab values.  Please request your primary care physician to go over all Hospital Tests and Procedure/Radiological results at the follow up. Please get all Hospital records sent to your PCP by signing hospital release before you go home.    Do not take more than prescribed Pain, Sleep and Anxiety Medications.  You were cared for by a hospitalist during your hospital stay. If you have any questions about your discharge medications or the care you received while you were in the hospital after you are discharged, you  can call the unit _0 @ you were admitted to and ask to speak with the hospitalist Berle Mull. Ask for Hospitalist on call if the hospitalist that took care of you is not available.   Once you are discharged, your primary care physician will handle any further medical issues.  You Must read complete instructions/literature along with all the possible adverse reactions/side effects for all the Medicines you take and that have been prescribed to you. Take any new Medicines after you have completely understood and accept all the possible adverse reactions/side effects.  If you have smoked or chewed Tobacco in the last 2 yrs please STOP smoking   Increase activity slowly   Complete by: As directed      Discharge Exam: Filed Weights   01/24/19 0933 01/24/19 1719  Weight: 59 kg 51.4 kg   Vitals:   01/30/19 0855 01/30/19 1332  BP:  (!) 99/48  Pulse: (!) 102 78  Resp: (!) 22 (!) 22  Temp:  98.4 F (36.9 C)  SpO2: 97% 98%   General: Appear in mild distress, no Rash; Oral Mucosa Clear, dry. no Abnormal Mass Or lumps Cardiovascular: S1 and  S2 Present, aortic systolic  Murmur, Respiratory: normal respiratory effort, Bilateral Air entry present and bilateral  Crackles, no wheezes Abdomen: Bowel Sound present, Soft and no tenderness, no hernia Extremities: no Pedal edema, no calf tenderness Neurology: alert and oriented to time, place, and person affect appropriate.  The results of significant diagnostics from this hospitalization (including imaging, microbiology, ancillary and laboratory) are listed below for reference.    Significant Diagnostic Studies: Ct Head Wo Contrast  Result Date: 01/24/2019 CLINICAL DATA:  Altered level of consciousness today. Status post fall today. Initial encounter. EXAM: CT HEAD WITHOUT CONTRAST TECHNIQUE: Contiguous axial images were obtained from the base of the skull through the vertex without intravenous contrast. COMPARISON:  None. FINDINGS: Brain: No evidence of acute infarction, hemorrhage, hydrocephalus, extra-axial collection or mass lesion/mass effect. Chronic microvascular ischemic change noted. Vascular: No hyperdense vessel or unexpected calcification. Skull: Intact.  No focal lesion. Sinuses/Orbits: Status post cataract surgery.  Otherwise negative. Other: Scalp laceration over the high left parietal bone is noted. IMPRESSION: Scalp laceration without underlying fracture or acute intracranial abnormality. Chronic microvascular ischemic change. Electronically Signed   By: Inge Rise M.D.   On: 01/24/2019 10:34   Ct Angio Chest Pe W Or Wo Contrast  Result Date: 01/24/2019 CLINICAL DATA:  Positive D-dimer. COPD. EXAM: CT ANGIOGRAPHY CHEST WITH CONTRAST TECHNIQUE: Multidetector CT imaging of the chest was performed using the standard protocol during bolus administration of intravenous contrast. Multiplanar CT image reconstructions and MIPs were obtained to evaluate the vascular anatomy. CONTRAST:  44m OMNIPAQUE IOHEXOL 350 MG/ML SOLN COMPARISON:  November 27, 2015 FINDINGS: Cardiovascular:  Satisfactory opacification of the pulmonary arteries to the segmental level. No evidence of pulmonary embolism. Normal heart size. No pericardial effusion. Calcific atherosclerotic disease of the coronary arteries and aorta. Mediastinum/Nodes: No enlarged mediastinal, hilar, or axillary lymph nodes. Thyroid gland, trachea, and esophagus demonstrate no significant findings. Lungs/Pleura: Bilateral small pleural effusions with dependent atelectasis in the lung bases. Areas of scarring containing coarse calcifications are seen in the right upper lobe, and right middle lobe and lingula Upper Abdomen: No acute abnormality. Musculoskeletal: Spondylosis and exaggerated thoracic kyphosis. Review of the MIP images confirms the above findings. IMPRESSION: 1. No evidence of pulmonary embolus. 2. Calcific atherosclerotic disease of the coronary arteries and aorta. 3. Bilateral small pleural effusions with dependent atelectasis  in the lung bases. 4. Areas of scarring containing coarse calcifications in the right upper lobe, right middle lobe and lingula. Aortic Atherosclerosis (ICD10-I70.0). Electronically Signed   By: Fidela Salisbury M.D.   On: 01/24/2019 19:29   Dg Chest Port 1 View  Result Date: 01/26/2019 CLINICAL DATA:  Shortness of breath and abdominal discomfort. EXAM: PORTABLE CHEST 1 VIEW COMPARISON:  Single-view of the chest 01/24/2019. PA and lateral chest 02/15/2018. CT chest 01/24/2019. FINDINGS: Aeration has markedly worsened since the most recent exam with diffuse bilateral airspace disease and small pleural effusions now seen. No pneumothorax. Heart size is upper normal. Atherosclerosis noted. IMPRESSION: Diffuse bilateral airspace disease and pleural effusions most consistent with congestive failure. Right middle lobe scar seen on prior CT and most recent plain film comparison is not visible on this exam. Electronically Signed   By: Inge Rise M.D.   On: 01/26/2019 11:06   Dg Chest Port 1  View  Result Date: 01/24/2019 CLINICAL DATA:  Pt unable to give history. Chart notes: EMS states pt lost her balance and fell, has multiple skin tears and bruises on her arms bilaterally. Pt has dry blood on her head. Pt fell several times yesterday per daughter EXAM: PORTABLE CHEST 1 VIEW COMPARISON:  Chest radiograph 02/15/2018, 12/05/2015 FINDINGS: Stable cardiomediastinal contours. Aortic arch calcification noted. Small ill-defined opacities at at the right lung base raise the possibility of atelectasis or possibly aspiration/early infection. The left lung is clear. No pneumothorax or large pleural effusion. No definite acute osseous finding. Osteopenia. IMPRESSION: Mild opacities at the right lung base raise concern for aspiration/early infection, less likely atelectasis or scarring. Electronically Signed   By: Audie Pinto M.D.   On: 01/24/2019 12:55   Dg Abd Portable 1v  Result Date: 01/26/2019 CLINICAL DATA:  Abdominal discomfort and shortness of breath. EXAM: PORTABLE ABDOMEN - 1 VIEW COMPARISON:  None. FINDINGS: The bowel gas pattern is normal. No radio-opaque calculi or other significant radiographic abnormality are seen. IMPRESSION: Negative exam. Electronically Signed   By: Inge Rise M.D.   On: 01/26/2019 11:00   [place] Ekg Site Rite  Result Date: 01/25/2019 If Site Rite image not attached, placement could not be confirmed due to current cardiac rhythm.   Microbiology: Recent Results (from the past 240 hour(s))  SARS Coronavirus 2 [facility] order, Performed in [facility] hospital lab) Nasopharyngeal Nasopharyngeal Swab     Status: None   Collection Time: 01/24/19  2:02 PM   Specimen: Nasopharyngeal Swab  Result Value Ref Range Status   SARS Coronavirus 2 NEGATIVE NEGATIVE Final    Comment: (NOTE) If result is NEGATIVE SARS-CoV-2 target nucleic acids are NOT DETECTED. The SARS-CoV-2 RNA is generally detectable in upper and lower  respiratory specimens during the acute phase  of infection. The lowest  concentration of SARS-CoV-2 viral copies this assay can detect is 250  copies / mL. A negative result does not preclude SARS-CoV-2 infection  and should not be used as the sole basis for treatment or other  patient management decisions.  A negative result may occur with  improper specimen collection / handling, submission of specimen other  than nasopharyngeal swab, presence of viral mutation(s) within the  areas targeted by this assay, and inadequate number of viral copies  (<250 copies / mL). A negative result must be combined with clinical  observations, patient history, and epidemiological information. If result is POSITIVE SARS-CoV-2 target nucleic acids are DETECTED. The SARS-CoV-2 RNA is generally detectable in upper and lower  respiratory specimens dur ing the acute phase of infection.  Positive  results are indicative of active infection with SARS-CoV-2.  Clinical  correlation with patient history and other diagnostic information is  necessary to determine patient infection status.  Positive results do  not rule out bacterial infection or co-infection with other viruses. If result is PRESUMPTIVE POSTIVE SARS-CoV-2 nucleic acids MAY BE PRESENT.   A presumptive positive result was obtained on the submitted specimen  and confirmed on repeat testing.  While 2019 novel coronavirus  (SARS-CoV-2) nucleic acids may be present in the submitted sample  additional confirmatory testing may be necessary for epidemiological  and / or clinical management purposes  to differentiate between  SARS-CoV-2 and other Sarbecovirus currently known to infect humans.  If clinically indicated additional testing with an alternate test  methodology [phone]) is advised. The SARS-CoV-2 RNA is generally  detectable in upper and lower respiratory sp ecimens during the acute  phase of infection. The expected result is Negative. Fact Sheet for Patients:   StrictlyIdeas.no Fact Sheet for Healthcare Providers: BankingDealers.co.za This test is not yet approved or cleared by the [place] FDA and has been authorized for detection and/or diagnosis of SARS-CoV-2 by FDA under an Emergency Use Authorization (EUA).  This EUA will remain in effect (meaning this test can be used) for the duration of the COVID-19 declaration under Section 564(b)(1) of the Act, 21 U.S.C. section 360bbb-3(b)(1), unless the authorization is terminated or revoked sooner. Performed at [facility], [place] [address], [place], [place] [address]   Culture, Urine     Status: None   Collection Time: 01/24/19  2:29 PM   Specimen: Urine, Random  Result Value Ref Range Status   Specimen Description   Final    URINE, RANDOM Performed at [place] [address], [place], [place] [address]    Special Requests   Final    NONE Performed at [facility], [place] [address], [place], [place] [address]    Culture   Final    NO GROWTH Performed at [facility], [place] [address] [place], [place] [address]    Report Status 01/25/2019 FINAL  Final  Culture, blood (routine x 2)     Status: None   Collection Time: 01/24/19  2:29 PM   Specimen: BLOOD  Result Value Ref Range Status   Specimen Description   Final    BLOOD RIGHT ANTECUBITAL Performed at [place] [address] [place], [place] [address]    Special Requests   Final    BOTTLES DRAWN AEROBIC AND ANAEROBIC Blood Culture adequate volume Performed at [place] [address] [place], [place] [address]    Culture   Final    NO GROWTH 5 DAYS Performed at [facility], [place] [address], [place], [place] [address]    Report Status 01/29/2019 FINAL  Final  Culture, blood (routine x 2)     Status: None   Collection Time: 01/24/19  2:34 PM   Specimen: BLOOD   Result Value Ref Range Status   Specimen Description   Final    BLOOD LEFT ANTECUBITAL Performed at [place] [address], [place], [place] [address]    Special Requests   Final    BOTTLES DRAWN AEROBIC AND ANAEROBIC Blood Culture adequate volume Performed at [place] [address], [place], [place] [address]    Culture   Final  NO GROWTH 5 DAYS Performed at [facility], [place] [address], [place], [place] [address]    Report Status 01/29/2019 FINAL  Final  MRSA PCR Screening     Status: None   Collection Time: 01/24/19  4:52 PM   Specimen: Nasal Mucosa; Nasopharyngeal  Result Value Ref Range Status   MRSA by PCR NEGATIVE NEGATIVE Final    Comment:        The GeneXpert MRSA Assay (FDA approved for NASAL specimens only), is one component of a comprehensive MRSA colonization surveillance program. It is not intended to diagnose MRSA infection nor to guide or monitor treatment for MRSA infections. Performed at [facility], [place] [address], [place], [place] [address]      Labs: CBC: Recent Labs  Lab 01/24/19 1413  01/26/19 0157 01/27/19 0502 01/28/19 0232 01/29/19 0522 01/30/19 0527  WBC 21.3*   < > 17.2* 15.9* 13.2* 13.6* 14.1*  NEUTROABS 18.8*  --   --  13.1*  --   --   --   HGB 9.6*   < > 10.4* 10.2* 10.3* 10.3* 10.2*  HCT 31.1*   < > 33.9* 32.8* 33.3* 32.2* 32.7*  MCV 97.2   < > 97.4 95.9 94.9 94.7 96.5  PLT 217   < > 228 238 274 247 376   < > = values in this interval not displayed.   Basic Metabolic Panel: Recent Labs  Lab 01/26/19 0157 01/27/19 0502 01/28/19 0232 01/29/19 0522 01/30/19 0527  NA 137 139 139 140 141  K 3.7 3.2* 3.6 3.5 3.5  CL 105 102 101 99 99  CO2 _0 GLUCOSE 118* 122* 102* 80 110*  BUN _1 CREATININE 0.76 0.72 0.87 0.89 0.89  CALCIUM 8.5* 8.0* 8.3* 8.6* 8.5*  MG  --  1.7 1.8 1.7 2.3   Liver Function Tests: Recent Labs  Lab  01/24/19 1105 01/27/19 0502  AST 30 47*  ALT 19 36  ALKPHOS 98 119  BILITOT 0.7 0.5  PROT 6.8 5.6*  ALBUMIN 3.3* 2.4*   No results for input(s): LIPASE, AMYLASE in the last 168 hours. No results for input(s): AMMONIA in the last 168 hours. Cardiac Enzymes: Recent Labs  Lab 01/24/19 1554 01/26/19 0807  CKTOTAL 288* 184   BNP (last 3 results) Recent Labs    01/29/19 0522  BNP 1,930.6*   CBG: No results for input(s): GLUCAP in the last 168 hours.  Time spent: 35 minutes  Signed:  Berle Mull  [facility] 01/30/2019 5:04 PM

## 2019-01-30 NOTE — Progress Notes (Signed)
No changes in initial am assessment. Pt for d.c home condition stable. Vss. Dgt verbalized understanding of instructions.

## 2019-01-30 NOTE — Progress Notes (Addendum)
Cardiology Progress Note  Patient ID: Jenna Carrillo MRN: [id number] DOB: [birth year] Date of Encounter: 01/30/2019  Primary Cardiologist: No primary care provider on file.  Subjective  Brief run of SVT overnight.  No complaints this morning.  Appears to be doing rather well.  ROS:  All other ROS reviewed and negative. Pertinent positives noted in the HPI.     Inpatient Medications  Scheduled Meds: . amitriptyline  50 mg Oral QHS  . apixaban  2.5 mg Oral BID  . benzonatate  100 mg Oral BID  . carvedilol  12.5 mg Oral BID WC  . cefdinir  300 mg Oral Q12H  . Chlorhexidine Gluconate Cloth  6 each Topical Daily  . guaiFENesin  600 mg Oral BID  . levothyroxine  75 mcg Oral Q0600  . lisinopril  5 mg Oral Daily  . mouth rinse  15 mL Mouth Rinse BID  . predniSONE  3 mg Oral Daily  . QUEtiapine  12.5 mg Oral BID  . umeclidinium bromide  1 puff Inhalation Daily   Continuous Infusions:  PRN Meds: acetaminophen, ipratropium, levalbuterol, metoprolol tartrate   Vital Signs   Vitals:   01/30/19 0450 01/30/19 0829 01/30/19 0850 01/30/19 0855  BP: 132/65 129/79    Pulse: 91 (!) 105 (!) 105 (!) 102  Resp: 20  (!) 22 (!) 22  Temp: 98.7 F (37.1 C)     TempSrc: Oral     SpO2: 98%  (!) 77% 97%  Weight:      Height:        Intake/Output Summary (Last 24 hours) at 01/30/2019 0940 Last data filed at 01/30/2019 0600 Gross per 24 hour  Intake 360 ml  Output 800 ml  Net -440 ml   Last 3 Weights 01/24/2019 01/24/2019 03/09/2018  Weight (lbs) 113 lb 5.1 oz 130 lb 130 lb  Weight (kg) 51.4 kg 58.968 kg 58.968 kg      Telemetry  Overnight telemetry shows brief run of nonsustained SVT, heart rate sinus rhythm to sinus tachycardia 9100, which I personally reviewed.   Physical Exam   Vitals:   01/30/19 0450 01/30/19 0829 01/30/19 0850 01/30/19 0855  BP: 132/65 129/79    Pulse: 91 (!) 105 (!) 105 (!) 102  Resp: 20  (!) 22 (!) 22  Temp: 98.7 F (37.1 C)     TempSrc: Oral     SpO2:  98%  (!) 77% 97%  Weight:      Height:         Intake/Output Summary (Last 24 hours) at 01/30/2019 0940 Last data filed at 01/30/2019 0600 Gross per 24 hour  Intake 360 ml  Output 800 ml  Net -440 ml    Last 3 Weights 01/24/2019 01/24/2019 03/09/2018  Weight (lbs) 113 lb 5.1 oz 130 lb 130 lb  Weight (kg) 51.4 kg 58.968 kg 58.968 kg    Body mass index is 21.41 kg/m.  General: Well nourished, well developed, in no acute distress Heart: Atraumatic, normal size  Eyes: PEERLA, EOMI  Neck: Supple, no JVD Endocrine: No thryomegaly Cardiac: Normal S1, S2; RRR; no murmurs, rubs, or gallops Lungs: Clear to auscultation bilaterally, no wheezing, rhonchi or rales  Abd: Soft, nontender, no hepatomegaly  Ext: No edema, pulses 2+ Musculoskeletal: No deformities, BUE and BLE strength normal and equal Skin: Warm and dry, no rashes   Neuro: Alert and oriented to person, place, time, and situation, CNII-XII grossly intact, no focal deficits  Psych: Normal mood and affect  Labs  High Sensitivity Troponin:   Recent Labs  Lab 01/28/19 0232 01/28/19 0425  TROPONINIHS 1,589* 1,653*     Cardiac EnzymesNo results for input(s): TROPONINI in the last 168 hours. No results for input(s): TROPIPOC in the last 168 hours.  Chemistry Recent Labs  Lab 01/24/19 1105  01/27/19 0502 01/28/19 0232 01/29/19 0522 01/30/19 0527  NA 137   < > 139 139 140 141  K 4.0   < > 3.2* 3.6 3.5 3.5  CL 98   < > 102 101 99 99  CO2 28   < > 25 27 28 31   GLUCOSE 116*   < > 122* 102* 80 110*  BUN 18   < > 11 14 18 18   CREATININE 0.96   < > 0.72 0.87 0.89 0.89  CALCIUM 8.8*   < > 8.0* 8.3* 8.6* 8.5*  PROT 6.8  --  5.6*  --   --   --   ALBUMIN 3.3*  --  2.4*  --   --   --   AST 30  --  47*  --   --   --   ALT 19  --  36  --   --   --   ALKPHOS 98  --  119  --   --   --   BILITOT 0.7  --  0.5  --   --   --   GFRNONAA 54*   < > >60 >60 59* 59*  GFRAA >60   < > >60 >60 >60 >60  ANIONGAP 11   < > 12 11 13 11    < > =  values in this interval not displayed.    Hematology Recent Labs  Lab 01/28/19 0232 01/29/19 0522 01/30/19 0527  WBC 13.2* 13.6* 14.1*  RBC 3.51* 3.40* 3.39*  HGB 10.3* 10.3* 10.2*  HCT 33.3* 32.2* 32.7*  MCV 94.9 94.7 96.5  MCH 29.3 30.3 30.1  MCHC 30.9 32.0 31.2  RDW 14.0 14.3 14.2  PLT 274 247 376   BNP Recent Labs  Lab 01/29/19 0522  BNP 1,930.6*    DDimer  Recent Labs  Lab 01/24/19 1554  DDIMER 2.49*     Radiology  No results found.  Cardiac Studies  ECHO:01/28/2019 1. Severe hypokinesis of the left ventricular, mid-apical anteroseptal wall, anterior wall and apical segment. 2. The left ventricle has mild-moderately reduced systolic function, with an ejection fraction of 40-45%. The cavity size was normal. Left ventricular diastolic Doppler parameters are consistent with pseudonormalization. Elevated mean left atrial pressure. 3. The right ventricle has normal systolic function. The cavity was normal. There is no increase in right ventricular wall thickness. Right ventricular systolic pressure is severely elevated. 4. Left atrial size was mildly dilated.R atrium normal 5. Moderate pleural effusion in the left lateral region. 6. The mitral valve is degenerative. Moderate thickening of the mitral valve leaflet. Moderate calcification of the mitral valve leaflet. There is moderate mitral annular calcification present. Mild mitral valve stenosis. 7. Tricuspid valve regurgitation is mild-moderate. 8. Mild thickening of the aortic valve. 9. The aorta is not well visualized unless otherwise noted. 10. The inferior vena cava was normal in size with <50% respiratory variability. 11. No intracardiac thrombi or masses were visualized.  Patient Profile  Jenna Carrillo is a 85 y.o. female with history of hypertension, hyperlipidemia, hypothyroidism, GERD, COPD, polymyalgia rheumatica, CKD who was admitted for sepsis secondary to pneumonia.  Cardiology has been  asked to evaluate a new  reduced ejection fraction in the setting of elevated troponin, as well as paroxysmal atrial fibrillation.  Assessment & Plan  1.  Paroxysmal atrial fibrillation -Occurred in the setting of acute illness.  She is remained in sinus rhythm.  She will continue Coreg 12.5 mg twice daily upon discharge.  We also will continue Eliquis 2.5 mg twice daily for now.  2.  New onset systolic heart failure, stress-induced cardiomyopathy, non-MI troponin elevation -We will continue her carvedilol 12.5 mg twice daily -I have added lisinopril 5 mg daily to be continued upon discharge -Would also recommend 20 mg of Lasix as needed for lower extremity edema -I will arrange follow-up for her to see me in clinic upon discharge and I will repeat an echocardiogram in 1 to 2 months  [facility] will sign off.   Medication Recommendations: Carvedilol and lisinopril as above, Lasix as needed as above Other recommendations (labs, testing, etc): None Follow up as an outpatient: I will arrange follow-up with her to see me directly in 1 month upon discharge, I discussed this plan with her Daughter, Jenna Carrillo who was in agreement.   For questions or updates, please contact [place] Please consult [email or web address] for contact info under        Signed, [place] T. Audie Box, [place]  01/30/2019 9:40 AM

## 2019-01-30 NOTE — TOC Progression Note (Signed)
Transition of Care [facility]) - Progression Note    Patient Details  Name: NILZA EAKER MRN: [id number] Date of Birth: 1931-04-25  Transition of Care [facility]) CM/SW Contact  Joaquin Courts, RN Phone Number: 01/30/2019, 9:36 AM  Clinical Narrative:    [employer] to deliver oxygen to bedside for home use.    Expected Discharge Plan: [place] Barriers to Discharge: Continued Medical Work up  Expected Discharge Plan and Services Expected Discharge Plan: [place]   Discharge Planning Services: CM Consult   Living arrangements for the past 2 months: Single Family Home                 DME Arranged: Oxygen DME Agency: [employer] Date DME Agency Contacted: 01/30/19 Time DME Agency Contacted: [phone] Representative spoke with at DME Agency: [place] [facility] Arranged: RN, PT, OT, Nurse's Aide, Social Work [employer] Agency: [place] ([place]) Date [place]: 01/28/19 Time [place]: 1417 Representative spoke with at [place]: [place] ([place]) Interventions    Readmission Risk Interventions No flowsheet data found.

## 2019-03-04 ENCOUNTER — Encounter (HOSPITAL_COMMUNITY): Payer: Self-pay | Admitting: Emergency Medicine

## 2019-03-04 ENCOUNTER — Other Ambulatory Visit: Payer: Self-pay

## 2019-03-04 ENCOUNTER — Emergency Department (HOSPITAL_COMMUNITY)
Admission: EM | Admit: 2019-03-04 | Discharge: 2019-03-04 | Disposition: A | Discharge status: Home / Self Care | Payer: Medicare Other | Attending: Emergency Medicine | Admitting: Emergency Medicine

## 2019-03-04 DIAGNOSIS — E039 Hypothyroidism, unspecified: Secondary | ICD-10-CM | POA: Insufficient documentation

## 2019-03-04 DIAGNOSIS — I10 Essential (primary) hypertension: Secondary | ICD-10-CM | POA: Diagnosis not present

## 2019-03-04 DIAGNOSIS — Z7901 Long term (current) use of anticoagulants: Secondary | ICD-10-CM | POA: Insufficient documentation

## 2019-03-04 DIAGNOSIS — T50901A Poisoning by unspecified drugs, medicaments and biological substances, accidental (unintentional), initial encounter: Secondary | ICD-10-CM | POA: Insufficient documentation

## 2019-03-04 DIAGNOSIS — Z87891 Personal history of nicotine dependence: Secondary | ICD-10-CM | POA: Diagnosis not present

## 2019-03-04 DIAGNOSIS — J449 Chronic obstructive pulmonary disease, unspecified: Secondary | ICD-10-CM | POA: Diagnosis not present

## 2019-03-04 DIAGNOSIS — Z79899 Other long term (current) drug therapy: Secondary | ICD-10-CM | POA: Diagnosis not present

## 2019-03-04 DIAGNOSIS — R42 Dizziness and giddiness: Secondary | ICD-10-CM | POA: Diagnosis present

## 2019-03-04 NOTE — ED Triage Notes (Signed)
Patient is from home where is cared for by her daughter. Today the patient took her own medication and also accidentally took her brothers medication ( lisinopril, baclofen, Wellbutrin and Diazepam). Medication was taken 2 hours ago. After taking the medication the patient stumbled and went to lay down in bed. When EMS arrived they noted the patient mentation was at baseline however, she was dizzy upon standing. Lowest EMS BP was 84/44. 650 cc of fluid thus far has been given by EMS. Last BP 100/48.     EMS vitals: CBG 120 BP 100/48 HR 74  SPO2 99% on room air

## 2019-03-04 NOTE — Discharge Instructions (Addendum)
Followup with your doctor as needed

## 2019-03-04 NOTE — ED Provider Notes (Signed)
[facility]-EMERGENCY DEPT Provider Note   CSN: [id number] Arrival date & time: 03/04/19  1231     History   Chief Complaint Chief Complaint  Patient presents with  . Dizziness  . Hypotension    HPI Jenna Carrillo is a 85 y.o. female.     85 year old female who is here after mistakenly taking medications that were not hers.  Her daughter mistakenly gave her the medications of another household member.  Patient reportedly took 1 lisinopril, baclofen, Wellbutrin, diazepam.  EMS was called and patient felt dizzy and lightheaded.  Was hypotensive at 84/44 was given 650 cc of fluids.  Blood pressure has improved at that time.  Patient denies any complaints of headache, chest pain, abdominal discomfort.     Past Medical History:  Diagnosis Date  . Bronchiectasis   . COPD (chronic obstructive pulmonary disease) (HCC)   . Degenerative disk disease   . Eczema   . Gastric ulcer   . GERD (gastroesophageal reflux disease)   . Hiatal hernia   . Hyperlipidemia   . Hypertension   . Hypothyroidism   . Osteoarthritis   . Polymyalgia rheumatica (HCC)   . Pulmonary nodule, right   . Vertebrobasilar insufficiency     Patient Active Problem List   Diagnosis Date Noted  . Acute systolic heart failure (HCC) 01/28/2019  . Atrial fibrillation (HCC)   . Current chronic use of systemic steroids 01/25/2019  . Encephalopathy 01/25/2019  . Fall from slip, trip, or stumble, initial encounter   . Generalized weakness   . Sepsis due to pneumonia (HCC) 01/24/2019  . Bronchiectasis with (acute) exacerbation (HCC) 12/20/2018  . Lobar pneumonia, unspecified organism (HCC) 02/15/2018  . Nuclear sclerosis of both eyes 03/05/2016  . Glaucoma suspect, bilateral 03/05/2016  . CAP (community acquired pneumonia) 11/12/2015  . Pulmonary nodules 10/12/2015  . Cough 02/03/2014  . Macular degeneration, left eye 12/28/2013  . Glaucoma suspect, both eyes 12/28/2013  . Allergic  rhinitis 11/12/2012  . Nuclear cataract 08/08/2011  . BRONCHIECTASIS 03/09/2009  . COPD (chronic obstructive pulmonary disease) (HCC) 03/09/2009  . Essential hypertension 01/31/2009  . Polymyalgia rheumatica (HCC) 01/31/2009    Past Surgical History:  Procedure Laterality Date  . BREAST CYST EXCISION     right  . broken tail bone    . CAROTID ENDARTERECTOMY     right  . RHINOPLASTY    . TOTAL ABDOMINAL HYSTERECTOMY       OB History   No obstetric history on file.      Home Medications    Prior to Admission medications   Medication Sig Start Date End Date Taking? Authorizing Provider  amitriptyline (ELAVIL) 50 MG tablet Take 50 mg by mouth at bedtime.    [provider]  apixaban (ELIQUIS) 2.5 MG TABS tablet Take 1 tablet (2.5 mg total) by mouth 2 (two) times daily. 01/30/19   Rolly SalterPatel, Pranav M, MD  benzonatate (TESSALON) 200 MG capsule Take 1 capsule (200 mg total) by mouth 3 (three) times daily as needed for cough. 01/30/19   Rolly SalterPatel, Pranav M, MD  carvedilol (COREG) 12.5 MG tablet Take 1 tablet (12.5 mg total) by mouth 2 (two) times daily with a meal. 01/30/19   Rolly SalterPatel, Pranav M, MD  furosemide (LASIX) 20 MG tablet Take 1 tablet (20 mg total) by mouth daily as needed for fluid or edema (Weight gain of more than 3 pounds in 1 day or 5 pounds in 2 days). 01/30/19 01/30/20  Lynden OxfordPatel, Pranav  M, MD  guaiFENesin (MUCINEX) 600 MG 12 hr tablet Take 1 tablet (600 mg total) by mouth 2 (two) times daily. 01/30/19   Lavina Hamman, MD  levalbuterol [facility] HFA) 45 MCG/ACT inhaler Inhale 1 puff into the lungs every 4 (four) hours as needed for wheezing. 01/30/19 01/30/20  Lavina Hamman, MD  levothyroxine (SYNTHROID) 75 MCG tablet Take 1 tablet (75 mcg total) by mouth daily at 6 (six) AM. 01/31/19   Lavina Hamman, MD  lisinopril (ZESTRIL) 5 MG tablet Take 1 tablet (5 mg total) by mouth daily. 01/31/19   Lavina Hamman, MD  Multiple Vitamin (MULTIVITAMIN) tablet Take 1 tablet by mouth daily.       [provider]  omeprazole (PRILOSEC) 20 MG capsule Take 20 mg by mouth daily as needed (acid reflux).     [provider]  predniSONE (DELTASONE) 1 MG tablet Take 3 mg by mouth daily.     [provider]  QUEtiapine (SEROQUEL) 25 MG tablet Take 0.5 tablets (12.5 mg total) by mouth at bedtime. 01/30/19   Lavina Hamman, MD  SPIRIVA HANDIHALER 18 MCG inhalation capsule PLACE 1 CAPSULE (18 MCG TOTAL) INTO INHALER AND INHALE DAILY Patient taking differently: Place 18 mcg into inhaler and inhale daily.  01/07/19   Chesley Mires, MD    Family History Family History  Problem Relation Age of Onset  . COPD Mother   . Breast cancer Mother   . Emphysema Mother   . Liver disease Son   . Emphysema Father   . Heart attack Father     Social History Social History   Tobacco Use  . Smoking status: Former Smoker    Packs/day: 2.00    Years: 2.00    Pack years: 4.00    Types: Cigarettes    Quit date: 05/19/1958    Years since quitting: 60.8  . Smokeless tobacco: Never Used  Substance Use Topics  . Alcohol use: No  . Drug use: Never     Allergies   Sulfonamide derivatives   Review of Systems Review of Systems  All other systems reviewed and are negative.    Physical Exam Updated Vital Signs BP (!) 98/55   Pulse 72   Temp (!) 97.4 F (36.3 C) (Oral)   Resp (!) 26   SpO2 93%   Physical Exam Vitals signs and nursing note reviewed.  Constitutional:      General: She is not in acute distress.    Appearance: Normal appearance. She is well-developed. She is not toxic-appearing.  HENT:     Head: Normocephalic and atraumatic.  Eyes:     General: Lids are normal.     Conjunctiva/sclera: Conjunctivae normal.     Pupils: Pupils are equal, round, and reactive to light.  Neck:     Musculoskeletal: Normal range of motion and neck supple.     Thyroid: No thyroid mass.     Trachea: No tracheal deviation.  Cardiovascular:     Rate and Rhythm: Normal rate  and regular rhythm.     Heart sounds: Normal heart sounds. No murmur. No gallop.   Pulmonary:     Effort: Pulmonary effort is normal. No respiratory distress.     Breath sounds: Normal breath sounds. No stridor. No decreased breath sounds, wheezing, rhonchi or rales.  Abdominal:     General: Bowel sounds are normal. There is no distension.     Palpations: Abdomen is soft.     Tenderness: There is  no abdominal tenderness. There is no rebound.  Musculoskeletal: Normal range of motion.        General: No tenderness.  Skin:    General: Skin is warm and dry.     Findings: No abrasion or rash.  Neurological:     Mental Status: She is alert and oriented to person, place, and time.     GCS: GCS eye subscore is 4. GCS verbal subscore is 5. GCS motor subscore is 6.     Cranial Nerves: No cranial nerve deficit.     Sensory: No sensory deficit.  Psychiatric:        Speech: Speech normal.        Behavior: Behavior normal.      ED Treatments / Results  Labs (all labs ordered are listed, but only abnormal results are displayed) Labs Reviewed - No data to display  EKG None  Radiology No results found.  Procedures Procedures (including critical care time)  Medications Ordered in ED Medications - No data to display   Initial Impression / Assessment and Plan / ED Course  I have reviewed the triage vital signs and the nursing notes.  Pertinent labs & imaging results that were available during my care of the patient were reviewed by me and considered in my medical decision making (see chart for details).        Patient monitored here and blood pressure has improved.  Did receive IV fluids by EMS.  Daughter is at the bedside and states that she feels as though she is at her baseline.  Will discharge home  Final Clinical Impressions(s) / ED Diagnoses   Final diagnoses:  None    ED Discharge Orders    None       Lorre Nick, MD 03/04/19 1625

## 2019-04-05 ENCOUNTER — Telehealth: Payer: Self-pay | Admitting: Cardiovascular Disease

## 2019-04-05 NOTE — Telephone Encounter (Signed)
Returned call to patient's daughter Butch Penny.She stated mother gets confused and she needs to come back with her at her appointment with Dr.O'Neal 04/07/19.Advised ok for her to come back.

## 2019-04-05 NOTE — Telephone Encounter (Signed)
New Message  Patient's daughter Butch Penny is calling in to get approval to attend appointment with patient. States that patient is 85 years old and requires her to be there with her at the appointment. Please give patient's daughter a call back to confirm.

## 2019-04-06 NOTE — Progress Notes (Signed)
Cardiology Office Note:   Date:  04/07/2019  NAME:  Jenna Carrillo    MRN: [id number] DOB:  1930/07/30   PCP:  Creola Corn, MD  Cardiologist:  Reatha Harps, MD   Referring MD: Creola Corn, MD   Chief Complaint  Patient presents with  . Congestive Heart Failure   History of Present Illness:   Jenna Carrillo is a 85 y.o. female with a hx of systolic HF (EF 40-45%, stress-induced?), paroxsymal AF, COPD, who is being seen today for hospital follow-up of congestive heart failure. She was admitted 01/24/2019-01/30/2019 for sepsis 2/2 PNA and found to have newly diagnosed systolic HF and new-onset Afib. She self-converted back to NSR prior to discharge and she was started on GDMT for her heart failure. She did have apical ballooning concerning for stress-induced CM. Due to age, significant comorbidities, she ws managed medically.   She presents with her daughter.  She has now moved in with her daughter due to concerns of early dementia as well as frailty.  She reports that Jenna Carrillo is doing quite well.  She has no lower extreme edema or shortness of breath or chest pain.  She is able to get around the house without any major limitations.  They have not even taken the Lasix pills leaving the hospital.  She also reports no episodes of chest pain or palpitations.  Her examination today shows a regular rate and rhythm without any extra heartbeats or concerns for atrial fibrillation.  Her blood pressure is well controlled today.  She is tolerating carvedilol lisinopril well.  She also is on Eliquis 2.5 mg twice daily.  Tolerating this medication without any issues of bleeding.  There are no falls either per the daughter.  They did inquire about the price as it is a bit excessive.  I reported that given her high chads vas score it would be advisable to continue this for now.   Past Medical History: Past Medical History:  Diagnosis Date  . Bronchiectasis   . COPD (chronic obstructive pulmonary  disease) (HCC)   . Degenerative disk disease   . Eczema   . Gastric ulcer   . GERD (gastroesophageal reflux disease)   . Hiatal hernia   . Hyperlipidemia   . Hypertension   . Hypothyroidism   . Osteoarthritis   . Polymyalgia rheumatica (HCC)   . Pulmonary nodule, right   . Vertebrobasilar insufficiency     Past Surgical History: Past Surgical History:  Procedure Laterality Date  . BREAST CYST EXCISION     right  . broken tail bone    . CAROTID ENDARTERECTOMY     right  . RHINOPLASTY    . TOTAL ABDOMINAL HYSTERECTOMY      Current Medications: Current Meds  Medication Sig  . amitriptyline (ELAVIL) 50 MG tablet Take 50 mg by mouth at bedtime.  Marland Kitchen apixaban (ELIQUIS) 2.5 MG TABS tablet Take 1 tablet (2.5 mg total) by mouth 2 (two) times daily.  . benzonatate (TESSALON) 200 MG capsule Take 1 capsule (200 mg total) by mouth 3 (three) times daily as needed for cough.  . carvedilol (COREG) 12.5 MG tablet Take 1 tablet (12.5 mg total) by mouth 2 (two) times daily with a meal.  . furosemide (LASIX) 20 MG tablet Take 1 tablet (20 mg total) by mouth daily as needed for fluid or edema (Weight gain of more than 3 pounds in 1 day or 5 pounds in 2 days).  Marland Kitchen guaiFENesin (MUCINEX) 600 MG  12 hr tablet Take 1 tablet (600 mg total) by mouth 2 (two) times daily.  Marland Kitchen. levalbuterol (XOPENEX HFA) 45 MCG/ACT inhaler Inhale 1 puff into the lungs every 4 (four) hours as needed for wheezing.  Marland Kitchen. levothyroxine (SYNTHROID) 75 MCG tablet Take 1 tablet (75 mcg total) by mouth daily at 6 (six) AM.  . lisinopril (ZESTRIL) 5 MG tablet Take 1 tablet (5 mg total) by mouth daily.  . Multiple Vitamin (MULTIVITAMIN) tablet Take 1 tablet by mouth daily.    Marland Kitchen. omeprazole (PRILOSEC) 20 MG capsule Take 20 mg by mouth daily as needed (acid reflux).   . predniSONE (DELTASONE) 1 MG tablet Take 3 mg by mouth daily.   Marland Kitchen. SPIRIVA HANDIHALER 18 MCG inhalation capsule PLACE 1 CAPSULE (18 MCG TOTAL) INTO INHALER AND INHALE DAILY  (Patient taking differently: Place 18 mcg into inhaler and inhale daily. )  . [DISCONTINUED] apixaban (ELIQUIS) 2.5 MG TABS tablet Take 1 tablet (2.5 mg total) by mouth 2 (two) times daily.  . [DISCONTINUED] carvedilol (COREG) 12.5 MG tablet Take 1 tablet (12.5 mg total) by mouth 2 (two) times daily with a meal.  . [DISCONTINUED] lisinopril (ZESTRIL) 5 MG tablet Take 1 tablet (5 mg total) by mouth daily.     Allergies:    Sulfonamide derivatives   Social History: Social History   Socioeconomic History  . Marital status: Widowed    Spouse name: Not on file  . Number of children: Not on file  . Years of education: Not on file  . Highest education level: Not on file  Occupational History  . Occupation: Retired    Comment: staining glass  Social Needs  . Financial resource strain: Not on file  . Food insecurity    Worry: Not on file    Inability: Not on file  . Transportation needs    Medical: Not on file    Non-medical: Not on file  Tobacco Use  . Smoking status: Former Smoker    Packs/day: 2.00    Years: 2.00    Pack years: 4.00    Types: Cigarettes    Quit date: 05/19/1958    Years since quitting: 60.9  . Smokeless tobacco: Never Used  Substance and Sexual Activity  . Alcohol use: No  . Drug use: Never  . Sexual activity: Not on file  Lifestyle  . Physical activity    Days per week: Not on file    Minutes per session: Not on file  . Stress: Not on file  Relationships  . Social Musicianconnections    Talks on phone: Not on file    Gets together: Not on file    Attends religious service: Not on file    Active member of club or organization: Not on file    Attends meetings of clubs or organizations: Not on file    Relationship status: Not on file  Other Topics Concern  . Not on file  Social History Narrative   Patient lives in [place], with her son who is bedridden from multiple sclerosis.   Daughter helps in the care and is planning to move both of them in with her,  once everyone is stable.     Family History: The patient's family history includes Breast cancer in her mother; COPD in her mother; Emphysema in her father and mother; Heart attack in her father; Liver disease in her son.  ROS:   All other ROS reviewed and negative. Pertinent positives noted in the HPI.  EKGs/Labs/Other Studies Reviewed:   The following studies were personally reviewed by me today:  TTE 01/28/2019  1. Severe hypokinesis of the left ventricular, mid-apical anteroseptal wall, anterior wall and apical segment.  2. The left ventricle has mild-moderately reduced systolic function, with an ejection fraction of 40-45%. The cavity size was normal. Left ventricular diastolic Doppler parameters are consistent with pseudonormalization. Elevated mean left atrial  pressure.  3. The right ventricle has normal systolic function. The cavity was normal. There is no increase in right ventricular wall thickness. Right ventricular systolic pressure is severely elevated.  4. Left atrial size was mildly dilated.  5. Moderate pleural effusion in the left lateral region.  6. The mitral valve is degenerative. Moderate thickening of the mitral valve leaflet. Moderate calcification of the mitral valve leaflet. There is moderate mitral annular calcification present. Mild mitral valve stenosis.  7. Tricuspid valve regurgitation is mild-moderate.  8. Mild thickening of the aortic valve.  9. The aorta is not well visualized unless otherwise noted. 10. The inferior vena cava was normal in size with <50% respiratory variability. 11. No intracardiac thrombi or masses were visualized.  Recent Labs: 01/26/2019: TSH 0.119 01/27/2019: ALT 36 01/29/2019: B Natriuretic Peptide 1,930.6 01/30/2019: BUN 18; Creatinine, Ser 0.89; Hemoglobin 10.2; Magnesium 2.3; Platelets 376; Potassium 3.5; Sodium 141   Recent Lipid Panel No results found for: CHOL, TRIG, HDL, CHOLHDL, VLDL, LDLCALC, LDLDIRECT  Physical Exam:    VS:  BP 135/60   Pulse 84   Ht 5\' 4"  (1.626 m)   Wt 109 lb 12.8 oz (49.8 kg)   BMI 18.85 kg/m    Wt Readings from Last 3 Encounters:  04/07/19 109 lb 12.8 oz (49.8 kg)  01/24/19 113 lb 5.1 oz (51.4 kg)  03/09/18 130 lb (59 kg)    General: Well nourished, well developed, in no acute distress Heart: Atraumatic, normal size  Eyes: PEERLA, EOMI  Neck: Supple, no JVD Endocrine: No thryomegaly Cardiac: Normal S1, S2; RRR; no murmurs, rubs, or gallops Lungs: Clear to auscultation bilaterally, no wheezing, rhonchi or rales  Abd: Soft, nontender, no hepatomegaly  Ext: No edema, pulses 2+ Musculoskeletal: No deformities, BUE and BLE strength normal and equal Skin: Warm and dry, no rashes   Neuro: Alert and oriented to person, place, time, and situation, CNII-XII grossly intact, no focal deficits  Psych: Normal mood and affect   ASSESSMENT:   Jenna Carrillo is a 85 y.o. female who presents for the following: 1. Chronic systolic heart failure (HCC)   2. Paroxysmal atrial fibrillation (HCC)     PLAN:   1. Chronic systolic heart failure (HCC), NYHA Class I, EF 40-45%, likely stress-induced  -She was diagnosed with congestive heart failure in setting of pneumonia and sepsis.  She had more of a Takotsubo pattern.  She is euvolemic today on examination tolerating carvedilol lisinopril well.  We will continue his medications.  She will continue with Lasix as needed.  We will plan to repeat an echocardiogram in the next few weeks.  Given her lack of symptoms are related.we will pursue more aggressive medications or interventions than what we are doing now.  She is without major limitations in doing remarkably well.  2. Paroxysmal atrial fibrillation (HCC) -CHADSVASC=4 -She had paroxysmal atrial fibrillation in the setting of sepsis secondary to pneumonia.  No recurrence on my examination today.  I have advised that we should continue Eliquis 2.5 mg twice daily as her no bleeding issues or no  major limitations of  this medication.  They have agreed to this for now.  Disposition: Return in about 3 months (around 07/08/2019).  Medication Adjustments/Labs and Tests Ordered: Current medicines are reviewed at length with the patient today.  Concerns regarding medicines are outlined above.  Orders Placed This Encounter  Procedures  . ECHOCARDIOGRAM COMPLETE   Meds ordered this encounter  Medications  . apixaban (ELIQUIS) 2.5 MG TABS tablet    Sig: Take 1 tablet (2.5 mg total) by mouth 2 (two) times daily.    Dispense:  60 tablet    Refill:  0  . carvedilol (COREG) 12.5 MG tablet    Sig: Take 1 tablet (12.5 mg total) by mouth 2 (two) times daily with a meal.    Dispense:  60 tablet    Refill:  0  . lisinopril (ZESTRIL) 5 MG tablet    Sig: Take 1 tablet (5 mg total) by mouth daily.    Dispense:  30 tablet    Refill:  0    Patient Instructions  Medication Instructions:  The current medical regimen is effective;  continue present plan and medications.  *If you need a refill on your cardiac medications before your next appointment, please call your pharmacy*  Testing/Procedures: Echocardiogram - Your physician has requested that you have an echocardiogram. Echocardiography is a painless test that uses sound waves to create images of your heart. It provides your doctor with information about the size and shape of your heart and how well your heart's chambers and valves are working. This procedure takes approximately one hour. There are no restrictions for this procedure. This will be performed at our [facility] location - [address].   Follow-Up: At [facility], you and your health needs are our priority.  As part of our continuing mission to provide you with exceptional heart care, we have created designated Provider Care Teams.  These Care Teams include your primary Cardiologist (physician) and Advanced Practice Providers (APPs -  Physician Assistants and Nurse  Practitioners) who all work together to provide you with the care you need, when you need it.  Your next appointment:   3 months  The format for your next appointment:   Virtual Visit   Provider:   Eleonore Chiquito, MD       Signed, Addison Naegeli. Audie Box, [place]  [address], [place], [place] [address] [phone]  04/07/2019 11:06 AM

## 2019-04-07 ENCOUNTER — Encounter: Payer: Self-pay | Admitting: Cardiovascular Disease

## 2019-04-07 ENCOUNTER — Ambulatory Visit: Payer: Medicare Other | Admitting: Cardiovascular Disease

## 2019-04-07 ENCOUNTER — Other Ambulatory Visit: Payer: Self-pay

## 2019-04-07 VITALS — BP 135/60 | HR 84 | Ht 64.0 in | Wt 109.8 lb

## 2019-04-07 DIAGNOSIS — I480 Paroxysmal atrial fibrillation: Secondary | ICD-10-CM | POA: Diagnosis not present

## 2019-04-07 DIAGNOSIS — I5022 Chronic systolic (congestive) heart failure: Secondary | ICD-10-CM

## 2019-04-07 MED ORDER — APIXABAN 2.5 MG PO TABS: 2.5000 mg | ORAL_TABLET | Freq: Two times a day (BID) | ORAL | 0 refills | Status: DC

## 2019-04-07 MED ORDER — LISINOPRIL 5 MG PO TABS: 5.0000 mg | ORAL_TABLET | Freq: Every day | ORAL | 0 refills | Status: DC

## 2019-04-07 MED ORDER — CARVEDILOL 12.5 MG PO TABS: 12.5000 mg | ORAL_TABLET | Freq: Two times a day (BID) | ORAL | 0 refills | Status: AC

## 2019-04-07 NOTE — Patient Instructions (Addendum)
Medication Instructions:  The current medical regimen is effective;  continue present plan and medications.  *If you need a refill on your cardiac medications before your next appointment, please call your pharmacy*  Testing/Procedures: Echocardiogram - Your physician has requested that you have an echocardiogram. Echocardiography is a painless test that uses sound waves to create images of your heart. It provides your doctor with information about the size and shape of your heart and how well your heart's chambers and valves are working. This procedure takes approximately one hour. There are no restrictions for this procedure. This will be performed at our [facility] location - [address].   Follow-Up: At [facility], you and your health needs are our priority.  As part of our continuing mission to provide you with exceptional heart care, we have created designated Provider Care Teams.  These Care Teams include your primary Cardiologist (physician) and Advanced Practice Providers (APPs -  Physician Assistants and Nurse Practitioners) who all work together to provide you with the care you need, when you need it.  Your next appointment:   3 months  The format for your next appointment:   Virtual Visit   Provider:   Eleonore Chiquito, MD

## 2019-04-21 ENCOUNTER — Telehealth: Payer: Self-pay | Admitting: Cardiovascular Disease

## 2019-04-21 NOTE — Telephone Encounter (Signed)
Pt's daughter aware of recommendations and agrees ./cy 

## 2019-04-21 NOTE — Telephone Encounter (Signed)
Please have her continue to hold her lisinopril. We can assess the need for this on her next echo which is scheduled for 12/8. -W

## 2019-04-21 NOTE — Telephone Encounter (Signed)
Spoke with pt's daughter and over the last 3-4 days has noted weakness,fatigue, and dizziness  See B/p readings below  This am's B/P was 142/63 and had not taken Lisinopril while on phone daughter had taken B/P again was 124/49 and HR 75 Will forward to Dr Marisue Ivan for review and recommendations ./cy

## 2019-04-21 NOTE — Telephone Encounter (Signed)
Pt c/o BP issue: STAT if pt c/o blurred vision, one-sided weakness or slurred speech  1. What are your last 5 BP readings? 11/29 76/38   12/2  102/41  today AM 142/63  2. Are you having any other symptoms (ex. Dizziness, headache, blurred vision, passed out)? dizziness , weak   3. What is your BP issue? LOWE

## 2019-04-24 ENCOUNTER — Other Ambulatory Visit: Payer: Self-pay | Admitting: Pulmonary Disease

## 2019-04-26 ENCOUNTER — Ambulatory Visit (HOSPITAL_COMMUNITY): Payer: Medicare Other | Attending: Cardiology

## 2019-04-26 ENCOUNTER — Other Ambulatory Visit: Payer: Self-pay

## 2019-04-26 DIAGNOSIS — I480 Paroxysmal atrial fibrillation: Secondary | ICD-10-CM | POA: Diagnosis present

## 2019-04-26 DIAGNOSIS — I5022 Chronic systolic (congestive) heart failure: Secondary | ICD-10-CM | POA: Diagnosis present

## 2019-04-29 ENCOUNTER — Telehealth: Payer: Self-pay | Admitting: Cardiovascular Disease

## 2019-04-29 NOTE — Telephone Encounter (Signed)
-----   Message from Luanna Salk, LPN sent at [id number]/46/2703  7:47 PM EST ----- Spoke to patient's daughter Butch Penny results given.She stated Dr.O'Neal was going to let her know if mother needed to restart Lisinopril,Stated B/P has been ranging 124/47,155/63 at present 142/90 pulse 90.Sent to Dr.O'Neal for advice.

## 2019-04-29 NOTE — Telephone Encounter (Signed)
Discussed with Jenna Carrillo's daughter echo results. LVEF back to normal. She is getting hypotensive on lisinopril. Will hold it for now. Will discuss further symptoms in February.   [place] T. Audie Box, [place]  [address] [place], [place] [address] [phone]  9:33 AM

## 2019-05-04 ENCOUNTER — Telehealth: Payer: Self-pay

## 2019-05-04 NOTE — Telephone Encounter (Signed)
Spoke to patient's daughter Butch Penny she stated she already spoke to Dr.O'Neal and he advised her mother to stay off Lisinopril

## 2019-05-04 NOTE — Telephone Encounter (Signed)
Spoke to patient's daughter Butch Penny she stated Dr.O'Neal called her and advised her mothe

## 2019-06-28 ENCOUNTER — Other Ambulatory Visit: Payer: Self-pay | Admitting: Pulmonary Disease

## 2019-07-10 NOTE — Progress Notes (Signed)
Virtual Visit via Telephone Note   This visit type was conducted due to national recommendations for restrictions regarding the COVID-19 Pandemic (e.g. social distancing) in an effort to limit this patient's exposure and mitigate transmission in our community.  Due to her co-morbid illnesses, this patient is at least at moderate risk for complications without adequate follow up.  This format is felt to be most appropriate for this patient at this time.  The patient did not have access to video technology/had technical difficulties with video requiring transitioning to audio format only (telephone).  All issues noted in this document were discussed and addressed.  No physical exam could be performed with this format.  Please refer to the patient's chart for her  consent to telehealth for [facility].   Date:  07/12/2019   ID:  Jenna Carrillo, DOB 01/11/1931, MRN [id number]  Patient Location: Home Provider Location: Home  PCP:  Creola Corn, MD  Cardiologist:  Reatha Harps, MD   Evaluation Performed:  Follow-Up Visit  Chief Complaint:  Systolic HF  History of Present Illness:    Jenna Carrillo is a 86 y.o. female with history of stress-induced cardiomyopathy in the setting of pneumonia, dementia, paroxysmal atrial fibrillation who presents for follow-up of stress-induced cardiomyopathy.  Most recent echocardiogram shows recovery of function.  She was diagnosed with a stress-induced cardiomyopathy 01/24/2019-01/30/2019 in the setting of sepsis secondary to pneumonia.  She also briefly had an episode of atrial fibrillation that converted.  We decided to hold her lisinopril as she was having episodes of low blood pressure spells.  No bleeding issues on Eliquis.  She reports she has no issues with walking around her house. She denies CP and SOB. No LE edema. She is not taking lasix and has no swelling. Her daughter has been pleased with her progress. No bleeding issues with eliquis. She did  have a fall yesterday that was mechanical. No bruising or bleeding.   Problem List 1. Stress-induced CM -01/2019 in setting of PNA -EF recovered 60-65% 2. Dementia  3. Paroxysmal Afib -On Eliquis  The patient does not have symptoms concerning for COVID-19 infection (fever, chills, cough, or new shortness of breath).    Past Medical History:  Diagnosis Date  . Bronchiectasis   . COPD (chronic obstructive pulmonary disease) (HCC)   . Degenerative disk disease   . Eczema   . Gastric ulcer   . GERD (gastroesophageal reflux disease)   . Hiatal hernia   . Hyperlipidemia   . Hypertension   . Hypothyroidism   . Osteoarthritis   . Polymyalgia rheumatica (HCC)   . Pulmonary nodule, right   . Vertebrobasilar insufficiency    Past Surgical History:  Procedure Laterality Date  . BREAST CYST EXCISION     right  . broken tail bone    . CAROTID ENDARTERECTOMY     right  . RHINOPLASTY    . TOTAL ABDOMINAL HYSTERECTOMY       Current Meds  Medication Sig  . amitriptyline (ELAVIL) 50 MG tablet Take 50 mg by mouth at bedtime.  Marland Kitchen apixaban (ELIQUIS) 2.5 MG TABS tablet Take 1 tablet (2.5 mg total) by mouth 2 (two) times daily.  . benzonatate (TESSALON) 200 MG capsule Take 1 capsule (200 mg total) by mouth 3 (three) times daily as needed for cough.  . carvedilol (COREG) 12.5 MG tablet Take 1 tablet (12.5 mg total) by mouth 2 (two) times daily with a meal.  . furosemide (LASIX) 20 MG  tablet Take 1 tablet (20 mg total) by mouth daily as needed for fluid or edema (Weight gain of more than 3 pounds in 1 day or 5 pounds in 2 days).  Marland Kitchen guaiFENesin (MUCINEX) 600 MG 12 hr tablet Take 1 tablet (600 mg total) by mouth 2 (two) times daily.  Marland Kitchen levalbuterol (XOPENEX HFA) 45 MCG/ACT inhaler Inhale 1 puff into the lungs every 4 (four) hours as needed for wheezing.  Marland Kitchen levothyroxine (SYNTHROID) 75 MCG tablet Take 1 tablet (75 mcg total) by mouth daily at 6 (six) AM.  . Multiple Vitamin (MULTIVITAMIN) tablet  Take 1 tablet by mouth daily.    Marland Kitchen omeprazole (PRILOSEC) 20 MG capsule Take 20 mg by mouth daily as needed (acid reflux).   . predniSONE (DELTASONE) 1 MG tablet Take 3 mg by mouth daily.   Marland Kitchen SPIRIVA HANDIHALER 18 MCG inhalation capsule PLACE 1 CAPSULE INTO THE INHALER AND INHALE DAILY     Allergies:   Sulfonamide derivatives   Social History   Tobacco Use  . Smoking status: Former Smoker    Packs/day: 2.00    Years: 2.00    Pack years: 4.00    Types: Cigarettes    Quit date: 05/19/1958    Years since quitting: 61.1  . Smokeless tobacco: Never Used  Substance Use Topics  . Alcohol use: No  . Drug use: Never    Family Hx: The patient's family history includes Breast cancer in her mother; COPD in her mother; Emphysema in her father and mother; Heart attack in her father; Liver disease in her son.  ROS:   Please see the history of present illness.     All other systems reviewed and are negative.   Prior CV studies:   The following studies were reviewed today:  TTE 04/26/2019 1. Left ventricular ejection fraction, by visual estimation, is 65 to  70%. The left ventricle has hyperdynamic function. There is no left  ventricular hypertrophy.  2. Elevated left ventricular end-diastolic pressure.  3. Left ventricular diastolic parameters are consistent with Grade II  diastolic dysfunction (pseudonormalization).  4. Global right ventricle has normal systolic function.The right  ventricular size is normal. No increase in right ventricular wall  thickness.  5. Left atrial size was normal.  6. Right atrial size was normal.  7. The mitral valve is normal in structure. Mild to moderate mitral valve  regurgitation. No evidence of mitral stenosis.  8. The tricuspid valve is normal in structure. Tricuspid valve  regurgitation is mild.  9. The aortic valve is normal in structure. Aortic valve regurgitation is  not visualized. No evidence of aortic valve sclerosis or stenosis.    10. The pulmonic valve was normal in structure. Pulmonic valve  regurgitation is mild.  11. Moderately elevated pulmonary artery systolic pressure.  12. The tricuspid regurgitant velocity is 3.16 m/s, and with an assumed  right atrial pressure of 3 mmHg, the estimated right ventricular systolic  pressure is moderately elevated at 42.9 mmHg.  13. The inferior vena cava is normal in size with greater than 50%  respiratory variability, suggesting right atrial pressure of 3 mmHg.   Labs/Other Tests and Data Reviewed:    EKG:  No ECG reviewed.     Recent Labs: 01/26/2019: TSH 0.119 01/27/2019: ALT 36 01/29/2019: B Natriuretic Peptide 1,930.6 01/30/2019: BUN 18; Creatinine, Ser 0.89; Hemoglobin 10.2; Magnesium 2.3; Platelets 376; Potassium 3.5; Sodium 141   Recent Lipid Panel No results found for: CHOL, TRIG, HDL, CHOLHDL, LDLCALC, LDLDIRECT  Wt Readings from Last 3 Encounters:  07/12/19 111 lb (50.3 kg)  04/07/19 109 lb 12.8 oz (49.8 kg)  01/24/19 113 lb 5.1 oz (51.4 kg)     Objective:    Vital Signs:  BP 133/65   Pulse 94   Ht 5\' 4"  (1.626 m)   Wt 111 lb (50.3 kg)   SpO2 96%   BMI 19.05 kg/m    VITAL SIGNS:  reviewed  ASSESSMENT & PLAN:    1. Chronic systolic heart failure (HCC) 2. Takotsubo cardiomyopathy -diagnosed 01/2019 in setting of PNA -EF has recovered -BP low with ACEi -only on coreg  -no symptoms -lasix PRN -1 year follow-up  3. Paroxysmal atrial fibrillation (HCC) -no recurrence -on eliquis 2.5 mg BID -no issues with bleeding   COVID-19 Education: The signs and symptoms of COVID-19 were discussed with the patient and how to seek care for testing (follow up with PCP or arrange E-visit).  The importance of social distancing was discussed today.  Time:   Today, I have spent 25 minutes with the patient with telehealth technology discussing the above problems.    Medication Adjustments/Labs and Tests Ordered: Current medicines are reviewed at length  with the patient today.  Concerns regarding medicines are outlined above.   Tests Ordered: No orders of the defined types were placed in this encounter.  Medication Changes: No orders of the defined types were placed in this encounter.  Follow Up:  In Person in 1 year(s)  Signed, 02/2019, MD  07/12/2019 1:54 PM    [facility]

## 2019-07-12 ENCOUNTER — Encounter: Payer: Self-pay | Admitting: Cardiovascular Disease

## 2019-07-12 ENCOUNTER — Telehealth (INDEPENDENT_AMBULATORY_CARE_PROVIDER_SITE_OTHER): Payer: Medicare Other | Admitting: Cardiovascular Disease

## 2019-07-12 VITALS — BP 133/65 | HR 94 | Ht 64.0 in | Wt 111.0 lb

## 2019-07-12 DIAGNOSIS — I5022 Chronic systolic (congestive) heart failure: Secondary | ICD-10-CM | POA: Diagnosis not present

## 2019-07-12 DIAGNOSIS — I5181 Takotsubo syndrome: Secondary | ICD-10-CM

## 2019-07-12 DIAGNOSIS — I480 Paroxysmal atrial fibrillation: Secondary | ICD-10-CM | POA: Diagnosis not present

## 2019-07-12 NOTE — Patient Instructions (Signed)
Medication Instructions:  The current medical regimen is effective;  continue present plan and medications.  *If you need a refill on your cardiac medications before your next appointment, please call your pharmacy*  Follow-Up: At [facility], you and your health needs are our priority.  As part of our continuing mission to provide you with exceptional heart care, we have created designated Provider Care Teams.  These Care Teams include your primary Cardiologist (physician) and Advanced Practice Providers (APPs -  Physician Assistants and Nurse Practitioners) who all work together to provide you with the care you need, when you need it.  Your next appointment:   12 month(s)  The format for your next appointment:   In Person  Provider:   Duncannon O'Neal, MD    

## 2019-07-27 ENCOUNTER — Other Ambulatory Visit: Payer: Self-pay | Admitting: Pulmonary Disease

## 2019-07-28 ENCOUNTER — Ambulatory Visit: Payer: Medicare Other

## 2019-08-01 ENCOUNTER — Encounter: Payer: Self-pay | Admitting: Adult Health

## 2019-08-01 ENCOUNTER — Telehealth (INDEPENDENT_AMBULATORY_CARE_PROVIDER_SITE_OTHER): Payer: Medicare Other | Admitting: Adult Health

## 2019-08-01 DIAGNOSIS — J181 Lobar pneumonia, unspecified organism: Secondary | ICD-10-CM

## 2019-08-01 MED ORDER — AEROCHAMBER MV MISC: 0 refills | Status: DC

## 2019-08-01 MED ORDER — SPIRIVA HANDIHALER 18 MCG IN CAPS: ORAL_CAPSULE | RESPIRATORY_TRACT | 5 refills | Status: DC

## 2019-08-01 NOTE — Progress Notes (Signed)
Reviewed and agree with assessment/plan.   Farrah Skoda, MD [facility] 05/14/2016, 12:24 PM Pager:  [phone]  

## 2019-08-01 NOTE — Patient Instructions (Addendum)
Spacer order to pharmacy, may use with Albuterol .  Return for follow up Chest xray next month as discussed (follow up Pneumonia) .  Continue on Spiriva 1 puff daily.  Activity as tolerated.  Follow up with Dr. Craige Cotta in 6 months and As needed

## 2019-08-01 NOTE — Progress Notes (Signed)
Virtual Visit via Video Note  I connected with Jenna Carrillo on 08/01/19 at  9:00 AM EDT by a video enabled telemedicine application and verified that I am speaking with the correct person using two identifiers.  Location: Patient: Home  Provider: Office   I discussed the limitations of evaluation and management by telemedicine and the availability of in person appointments. The patient expressed understanding and agreed to proceed.  History of Present Illness: 86 year old female former smoker followed for COPD with bronchiectasis Medical history significant for A. fib, polymyalgia rheumatica on chronic steroids  Today's video visit is a 74-month follow-up for COPD and bronchiectasis.  Patient is accompanied by her family member.  She says she has been doing okay over the last few months.  She remains on Spiriva daily.  She denies any increased cough or shortness of breath.  No increased albuterol use.  She had her first Covid vaccine last week. Says she tries to remain active and some does some light activities at home.  She did have a hospitalization in September for pneumonia.  We discussed a follow-up chest x-ray for resolution.  Patient Active Problem List   Diagnosis Date Noted  . Acute systolic heart failure ([place]) 01/28/2019  . Atrial fibrillation ([place])   . Current chronic use of systemic steroids 01/25/2019  . Encephalopathy 01/25/2019  . Fall from slip, trip, or stumble, initial encounter   . Generalized weakness   . Sepsis due to pneumonia ([place]) 01/24/2019  . Bronchiectasis with (acute) exacerbation ([place]) 12/20/2018  . Lobar pneumonia, unspecified organism ([place]) 02/15/2018  . Nuclear sclerosis of both eyes 03/05/2016  . Glaucoma suspect, bilateral 03/05/2016  . CAP (community acquired pneumonia) 11/12/2015  . Pulmonary nodules 10/12/2015  . Cough 02/03/2014  . Macular degeneration, left eye 12/28/2013  . Glaucoma suspect, both eyes 12/28/2013  . Allergic rhinitis  11/12/2012  . Nuclear cataract 08/08/2011  . BRONCHIECTASIS 03/09/2009  . COPD (chronic obstructive pulmonary disease) ([place]) 03/09/2009  . Essential hypertension 01/31/2009  . Polymyalgia rheumatica ([place]) 01/31/2009   Current Outpatient Medications on File Prior to Visit  Medication Sig Dispense Refill  . amitriptyline (ELAVIL) 50 MG tablet Take 50 mg by mouth at bedtime.    Marland Kitchen apixaban (ELIQUIS) 2.5 MG TABS tablet Take 1 tablet (2.5 mg total) by mouth 2 (two) times daily. 60 tablet 0  . benzonatate (TESSALON) 200 MG capsule Take 1 capsule (200 mg total) by mouth 3 (three) times daily as needed for cough. 30 capsule 0  . carvedilol (COREG) 12.5 MG tablet Take 1 tablet (12.5 mg total) by mouth 2 (two) times daily with a meal. 60 tablet 0  . furosemide (LASIX) 20 MG tablet Take 1 tablet (20 mg total) by mouth daily as needed for fluid or edema (Weight gain of more than 3 pounds in 1 day or 5 pounds in 2 days). 30 tablet 0  . guaiFENesin (MUCINEX) 600 MG 12 hr tablet Take 1 tablet (600 mg total) by mouth 2 (two) times daily. 30 tablet 0  . levalbuterol (XOPENEX HFA) 45 MCG/ACT inhaler Inhale 1 puff into the lungs every 4 (four) hours as needed for wheezing. 1 Inhaler 0  . levothyroxine (SYNTHROID) 75 MCG tablet Take 1 tablet (75 mcg total) by mouth daily at 6 (six) AM. 30 tablet 0  . Multiple Vitamin (MULTIVITAMIN) tablet Take 1 tablet by mouth daily.      Marland Kitchen omeprazole (PRILOSEC) 20 MG capsule Take 20 mg by mouth daily as needed (acid reflux).     Marland Kitchen  predniSONE (DELTASONE) 1 MG tablet Take 3 mg by mouth daily.      No current facility-administered medications on file prior to visit.     Observations/Objective: Speaks in full sentences does not appear to be in any distress  Assessment and Plan: COPD-appears stable Bronchiectasis-appears stable Pneumonia diagnosed in September 2020.  With hospitalization clinically improved after antibiotics.  Needs a follow-up chest x-ray-chest x-ray in  September showed bilateral airspace opacities.  Plan  Patient Instructions  Spacer order to pharmacy, may use with Albuterol .  Return for follow up Chest xray next month as discussed (follow up Pneumonia) .  Continue on Spiriva 1 puff daily.  Activity as tolerated.  Follow up with Dr. Craige Cotta in 6 months and As needed       Follow Up Instructions: Follow-up in 6 months and as needed    I discussed the assessment and treatment plan with the patient. The patient was provided an opportunity to ask questions and all were answered. The patient agreed with the plan and demonstrated an understanding of the instructions.   The patient was advised to call back or seek an in-person evaluation if the symptoms worsen or if the condition fails to improve as anticipated.  I provided 25 minutes of non-face-to-face time during this encounter.   Rubye Oaks, NP

## 2019-09-01 ENCOUNTER — Emergency Department (HOSPITAL_COMMUNITY): Payer: Medicare Other

## 2019-09-01 ENCOUNTER — Inpatient Hospital Stay (HOSPITAL_COMMUNITY)
Admission: EM | Admit: 2019-09-01 | Discharge: 2019-09-05 | DRG: 071 | Disposition: A | Discharge status: Home / Self Care | Payer: Medicare Other | Attending: Internal Medicine | Admitting: Internal Medicine

## 2019-09-01 ENCOUNTER — Encounter (HOSPITAL_COMMUNITY): Payer: Self-pay

## 2019-09-01 ENCOUNTER — Observation Stay (HOSPITAL_COMMUNITY): Payer: Medicare Other

## 2019-09-01 ENCOUNTER — Other Ambulatory Visit: Payer: Self-pay

## 2019-09-01 DIAGNOSIS — Z8711 Personal history of peptic ulcer disease: Secondary | ICD-10-CM

## 2019-09-01 DIAGNOSIS — J471 Bronchiectasis with (acute) exacerbation: Secondary | ICD-10-CM | POA: Diagnosis present

## 2019-09-01 DIAGNOSIS — J9601 Acute respiratory failure with hypoxia: Secondary | ICD-10-CM | POA: Diagnosis present

## 2019-09-01 DIAGNOSIS — Z66 Do not resuscitate: Secondary | ICD-10-CM | POA: Diagnosis present

## 2019-09-01 DIAGNOSIS — N390 Urinary tract infection, site not specified: Secondary | ICD-10-CM | POA: Diagnosis present

## 2019-09-01 DIAGNOSIS — J449 Chronic obstructive pulmonary disease, unspecified: Secondary | ICD-10-CM | POA: Diagnosis present

## 2019-09-01 DIAGNOSIS — I4891 Unspecified atrial fibrillation: Secondary | ICD-10-CM

## 2019-09-01 DIAGNOSIS — I5181 Takotsubo syndrome: Secondary | ICD-10-CM | POA: Diagnosis present

## 2019-09-01 DIAGNOSIS — Z882 Allergy status to sulfonamides status: Secondary | ICD-10-CM

## 2019-09-01 DIAGNOSIS — G450 Vertebro-basilar artery syndrome: Secondary | ICD-10-CM | POA: Diagnosis present

## 2019-09-01 DIAGNOSIS — R05 Cough: Secondary | ICD-10-CM | POA: Diagnosis present

## 2019-09-01 DIAGNOSIS — M1990 Unspecified osteoarthritis, unspecified site: Secondary | ICD-10-CM | POA: Diagnosis present

## 2019-09-01 DIAGNOSIS — R059 Cough, unspecified: Secondary | ICD-10-CM | POA: Diagnosis present

## 2019-09-01 DIAGNOSIS — J479 Bronchiectasis, uncomplicated: Secondary | ICD-10-CM

## 2019-09-01 DIAGNOSIS — Z825 Family history of asthma and other chronic lower respiratory diseases: Secondary | ICD-10-CM

## 2019-09-01 DIAGNOSIS — Z7952 Long term (current) use of systemic steroids: Secondary | ICD-10-CM

## 2019-09-01 DIAGNOSIS — T380X5A Adverse effect of glucocorticoids and synthetic analogues, initial encounter: Secondary | ICD-10-CM | POA: Diagnosis present

## 2019-09-01 DIAGNOSIS — Z803 Family history of malignant neoplasm of breast: Secondary | ICD-10-CM

## 2019-09-01 DIAGNOSIS — Z79899 Other long term (current) drug therapy: Secondary | ICD-10-CM

## 2019-09-01 DIAGNOSIS — Z7951 Long term (current) use of inhaled steroids: Secondary | ICD-10-CM

## 2019-09-01 DIAGNOSIS — Z7901 Long term (current) use of anticoagulants: Secondary | ICD-10-CM

## 2019-09-01 DIAGNOSIS — R4182 Altered mental status, unspecified: Secondary | ICD-10-CM

## 2019-09-01 DIAGNOSIS — A498 Other bacterial infections of unspecified site: Secondary | ICD-10-CM | POA: Diagnosis present

## 2019-09-01 DIAGNOSIS — Z90710 Acquired absence of both cervix and uterus: Secondary | ICD-10-CM

## 2019-09-01 DIAGNOSIS — Z20822 Contact with and (suspected) exposure to covid-19: Secondary | ICD-10-CM | POA: Diagnosis present

## 2019-09-01 DIAGNOSIS — I480 Paroxysmal atrial fibrillation: Secondary | ICD-10-CM | POA: Diagnosis present

## 2019-09-01 DIAGNOSIS — I7389 Other specified peripheral vascular diseases: Secondary | ICD-10-CM | POA: Diagnosis present

## 2019-09-01 DIAGNOSIS — D72829 Elevated white blood cell count, unspecified: Secondary | ICD-10-CM | POA: Diagnosis present

## 2019-09-01 DIAGNOSIS — Z8249 Family history of ischemic heart disease and other diseases of the circulatory system: Secondary | ICD-10-CM

## 2019-09-01 DIAGNOSIS — E785 Hyperlipidemia, unspecified: Secondary | ICD-10-CM | POA: Diagnosis present

## 2019-09-01 DIAGNOSIS — K449 Diaphragmatic hernia without obstruction or gangrene: Secondary | ICD-10-CM | POA: Diagnosis present

## 2019-09-01 DIAGNOSIS — G9341 Metabolic encephalopathy: Principal | ICD-10-CM | POA: Diagnosis present

## 2019-09-01 DIAGNOSIS — K219 Gastro-esophageal reflux disease without esophagitis: Secondary | ICD-10-CM | POA: Diagnosis present

## 2019-09-01 DIAGNOSIS — Z87891 Personal history of nicotine dependence: Secondary | ICD-10-CM

## 2019-09-01 DIAGNOSIS — F39 Unspecified mood [affective] disorder: Secondary | ICD-10-CM | POA: Diagnosis present

## 2019-09-01 DIAGNOSIS — I5032 Chronic diastolic (congestive) heart failure: Secondary | ICD-10-CM | POA: Diagnosis present

## 2019-09-01 DIAGNOSIS — M353 Polymyalgia rheumatica: Secondary | ICD-10-CM | POA: Diagnosis present

## 2019-09-01 DIAGNOSIS — F0390 Unspecified dementia without behavioral disturbance: Secondary | ICD-10-CM | POA: Diagnosis present

## 2019-09-01 DIAGNOSIS — Z79890 Hormone replacement therapy (postmenopausal): Secondary | ICD-10-CM

## 2019-09-01 DIAGNOSIS — E039 Hypothyroidism, unspecified: Secondary | ICD-10-CM | POA: Diagnosis present

## 2019-09-01 DIAGNOSIS — I158 Other secondary hypertension: Secondary | ICD-10-CM | POA: Diagnosis present

## 2019-09-01 DIAGNOSIS — E2740 Unspecified adrenocortical insufficiency: Secondary | ICD-10-CM | POA: Diagnosis present

## 2019-09-01 DIAGNOSIS — I10 Essential (primary) hypertension: Secondary | ICD-10-CM | POA: Diagnosis present

## 2019-09-01 LAB — CBC
HCT: 38.8 % (ref 36.0–46.0)
Hemoglobin: 12.2 g/dL (ref 12.0–15.0)
MCH: 30.7 pg (ref 26.0–34.0)
MCHC: 31.4 g/dL (ref 30.0–36.0)
MCV: 97.7 fL (ref 80.0–100.0)
Platelets: 324 10*3/uL (ref 150–400)
RBC: 3.97 MIL/uL (ref 3.87–5.11)
RDW: 13.1 % (ref 11.5–15.5)
WBC: 21.8 10*3/uL — ABNORMAL HIGH (ref 4.0–10.5)
nRBC: 0 % (ref 0.0–0.2)

## 2019-09-01 LAB — TROPONIN I (HIGH SENSITIVITY)
Troponin I (High Sensitivity): 27 ng/L — ABNORMAL HIGH (ref <18)
Troponin I (High Sensitivity): 40 ng/L — ABNORMAL HIGH (ref <18)

## 2019-09-01 LAB — COMPREHENSIVE METABOLIC PANEL
ALT: 26 U/L (ref 0–44)
AST: 42 U/L — ABNORMAL HIGH (ref 15–41)
Albumin: 3.6 g/dL (ref 3.5–5.0)
Alkaline Phosphatase: 115 U/L (ref 38–126)
Anion gap: 7 (ref 5–15)
BUN: 23 mg/dL (ref 8–23)
CO2: 32 mmol/L (ref 22–32)
Calcium: 8.9 mg/dL (ref 8.9–10.3)
Chloride: 98 mmol/L (ref 98–111)
Creatinine, Ser: 1.1 mg/dL — ABNORMAL HIGH (ref 0.44–1.00)
GFR calc Af Amer: 53 mL/min — ABNORMAL LOW (ref >60)
GFR calc non Af Amer: 45 mL/min — ABNORMAL LOW (ref >60)
Glucose, Bld: 122 mg/dL — ABNORMAL HIGH (ref 70–99)
Potassium: 4.6 mmol/L (ref 3.5–5.1)
Sodium: 137 mmol/L (ref 135–145)
Total Bilirubin: 0.7 mg/dL (ref 0.3–1.2)
Total Protein: 7.6 g/dL (ref 6.5–8.1)

## 2019-09-01 LAB — URINALYSIS, ROUTINE W REFLEX MICROSCOPIC
Bilirubin Urine: NEGATIVE
Glucose, UA: NEGATIVE mg/dL
Ketones, ur: NEGATIVE mg/dL
Leukocytes,Ua: NEGATIVE
Nitrite: NEGATIVE
Protein, ur: NEGATIVE mg/dL
Specific Gravity, Urine: 1.016 (ref 1.005–1.030)
pH: 5 (ref 5.0–8.0)

## 2019-09-01 LAB — PROTIME-INR
INR: 1.4 — ABNORMAL HIGH (ref 0.8–1.2)
Prothrombin Time: 16.6 seconds — ABNORMAL HIGH (ref 11.4–15.2)

## 2019-09-01 LAB — AMMONIA: Ammonia: 16 umol/L (ref 9–35)

## 2019-09-01 MED ORDER — PREDNISONE 5 MG PO TABS
9.0000 mg | ORAL_TABLET | Freq: Every day | ORAL | Status: DC
  Administered 2019-09-02 – 2019-09-05 (×5): 9 mg via ORAL
  Filled 2019-09-01 (×6): qty 4

## 2019-09-01 MED ORDER — TIOTROPIUM BROMIDE MONOHYDRATE 18 MCG IN CAPS: 18.0000 ug | ORAL_CAPSULE | Freq: Every day | RESPIRATORY_TRACT | Status: DC

## 2019-09-01 MED ORDER — SODIUM CHLORIDE 0.9 % IV BOLUS
500.0000 mL | Freq: Once | INTRAVENOUS | Status: AC
  Administered 2019-09-01: 19:00:00 500 mL via INTRAVENOUS

## 2019-09-01 MED ORDER — ADULT MULTIVITAMIN W/MINERALS CH
1.0000 | ORAL_TABLET | Freq: Every day | ORAL | Status: DC
  Administered 2019-09-02 – 2019-09-05 (×4): 1 via ORAL
  Filled 2019-09-01 (×4): qty 1

## 2019-09-01 MED ORDER — IPRATROPIUM-ALBUTEROL 0.5-2.5 (3) MG/3ML IN SOLN: 3.0000 mL | Freq: Two times a day (BID) | RESPIRATORY_TRACT | Status: DC | PRN

## 2019-09-01 MED ORDER — PANTOPRAZOLE SODIUM 40 MG PO TBEC
40.0000 mg | DELAYED_RELEASE_TABLET | Freq: Every day | ORAL | Status: DC
  Administered 2019-09-02 – 2019-09-05 (×4): 40 mg via ORAL
  Filled 2019-09-01 (×4): qty 1

## 2019-09-01 MED ORDER — LEVALBUTEROL TARTRATE 45 MCG/ACT IN AERO: 1.0000 | INHALATION_SPRAY | RESPIRATORY_TRACT | Status: DC | PRN

## 2019-09-01 MED ORDER — ACETAMINOPHEN 650 MG RE SUPP: 650.0000 mg | Freq: Four times a day (QID) | RECTAL | Status: DC | PRN

## 2019-09-01 MED ORDER — LEVOTHYROXINE SODIUM 75 MCG PO TABS
75.0000 ug | ORAL_TABLET | Freq: Every day | ORAL | Status: DC
  Administered 2019-09-02 – 2019-09-05 (×4): 75 ug via ORAL
  Filled 2019-09-01 (×4): qty 1

## 2019-09-01 MED ORDER — ONDANSETRON HCL 4 MG/2ML IJ SOLN: 4.0000 mg | Freq: Four times a day (QID) | INTRAMUSCULAR | Status: DC | PRN

## 2019-09-01 MED ORDER — ALBUTEROL SULFATE HFA 108 (90 BASE) MCG/ACT IN AERS: 1.0000 | INHALATION_SPRAY | RESPIRATORY_TRACT | Status: DC | PRN

## 2019-09-01 MED ORDER — IOHEXOL 300 MG/ML  SOLN
100.0000 mL | Freq: Once | INTRAMUSCULAR | Status: AC | PRN
  Administered 2019-09-01: 23:00:00 80 mL via INTRAVENOUS

## 2019-09-01 MED ORDER — CARVEDILOL 12.5 MG PO TABS
12.5000 mg | ORAL_TABLET | Freq: Two times a day (BID) | ORAL | Status: DC
  Administered 2019-09-02 – 2019-09-05 (×7): 12.5 mg via ORAL
  Filled 2019-09-01 (×8): qty 1

## 2019-09-01 MED ORDER — ONDANSETRON HCL 4 MG PO TABS: 4.0000 mg | ORAL_TABLET | Freq: Four times a day (QID) | ORAL | Status: DC | PRN

## 2019-09-01 MED ORDER — NITROFURANTOIN MONOHYD MACRO 100 MG PO CAPS: 100.0000 mg | ORAL_CAPSULE | Freq: Two times a day (BID) | ORAL | Status: DC

## 2019-09-01 MED ORDER — PREDNISONE 1 MG PO TABS: 3.0000 mg | ORAL_TABLET | Freq: Every day | ORAL | Status: DC

## 2019-09-01 MED ORDER — APIXABAN 2.5 MG PO TABS
2.5000 mg | ORAL_TABLET | Freq: Two times a day (BID) | ORAL | Status: DC
  Administered 2019-09-02 – 2019-09-05 (×8): 2.5 mg via ORAL
  Filled 2019-09-01 (×9): qty 1

## 2019-09-01 MED ORDER — ACETAMINOPHEN 325 MG PO TABS: 650.0000 mg | ORAL_TABLET | Freq: Four times a day (QID) | ORAL | Status: DC | PRN

## 2019-09-01 MED ORDER — AMITRIPTYLINE HCL 25 MG PO TABS
50.0000 mg | ORAL_TABLET | Freq: Every day | ORAL | Status: DC
  Administered 2019-09-02 – 2019-09-04 (×4): 50 mg via ORAL
  Filled 2019-09-01 (×4): qty 2

## 2019-09-01 MED ORDER — SODIUM CHLORIDE 0.9 % IV SOLN
2.0000 g | Freq: Once | INTRAVENOUS | Status: AC
  Administered 2019-09-01: 2 g via INTRAVENOUS
  Filled 2019-09-01: qty 20

## 2019-09-01 MED ORDER — SODIUM CHLORIDE 0.9 % IV SOLN: INTRAVENOUS | Status: DC

## 2019-09-01 NOTE — ED Notes (Signed)
This Clinical research associate saw patient trying to get out of the bed. This Clinical research associate and Joi, RN got patient back in bed and instructed patient to stay in the bed

## 2019-09-01 NOTE — ED Notes (Signed)
Pt was witnessed trying to get out of bed. This Clinical research associate and Josh, EMT repositioned patient and helped clean up patient and get patient back on the monitor.

## 2019-09-01 NOTE — ED Triage Notes (Signed)
Pt BIB EMS from home. Pts family reported increased confusion and weakness that started today. Pt had urinary frequency and pain with urination that started yesterday. Pt was started on antibiotics yesterday. Pt has also had cough x1 month and EMS hearing rhonchi in both lower lungs.   20G RAC HR 98 BP 160/68 95% 2L O2

## 2019-09-01 NOTE — ED Notes (Signed)
Pt transported to MRI 

## 2019-09-01 NOTE — H&P (Signed)
History and Physical    Jenna Carrillo MHD:[id number] DOB: 1930-08-08 DOA: 09/01/2019  PCP: Creola Corn, MD  Patient coming from: Home  I have personally briefly reviewed patient's old medical records in [facility]  Chief Complaint: AMS  HPI: Jenna Carrillo is a 86 y.o. female with medical history significant of A.Fib on eliquis, HTN, hypothyroidism, on chronic steroids, PMR.  Pt presents to ED with new onset AMS.  Yesterday had dysuria and some weakness.  Today had AMS and unable to get out of bed.  Pt not really able to answer questions.  Is following commands.  Answers yes/no questions only.   ED Course: WBC 21k, HR 100-110.  Trop 27, CT head neg, CXR neg.  UA with mod HGB, neg LE, neg nitrites, and 0-5 WBC.  Got 1 dose of 2g rocephin in ED.  Got 500cc NS in ED.  Per daughter: sounds like patient started macrobid for presumed UTI yesterday evening.   Review of Systems: Not sure how much ROS can be believed due to AMS, BUT: pt denies abd pain, CP, back pain, flank pain, does admit to new generalized weakness.  Past Medical History:  Diagnosis Date  . Bronchiectasis   . COPD (chronic obstructive pulmonary disease) (HCC)   . Degenerative disk disease   . Eczema   . Gastric ulcer   . GERD (gastroesophageal reflux disease)   . Hiatal hernia   . Hyperlipidemia   . Hypertension   . Hypothyroidism   . Osteoarthritis   . Polymyalgia rheumatica (HCC)   . Pulmonary nodule, right   . Vertebrobasilar insufficiency     Past Surgical History:  Procedure Laterality Date  . BREAST CYST EXCISION     right  . broken tail bone    . CAROTID ENDARTERECTOMY     right  . RHINOPLASTY    . TOTAL ABDOMINAL HYSTERECTOMY       reports that she quit smoking about 61 years ago. Her smoking use included cigarettes. She has a 4.00 pack-year smoking history. She has never used smokeless tobacco. She reports that she does not drink alcohol or use drugs.  Allergies  Allergen  Reactions  . Sulfonamide Derivatives Rash    Family History  Problem Relation Age of Onset  . COPD Mother   . Breast cancer Mother   . Emphysema Mother   . Liver disease Son   . Emphysema Father   . Heart attack Father      Prior to Admission medications   Medication Sig Start Date End Date Taking? Authorizing Provider  albuterol (VENTOLIN HFA) 108 (90 Base) MCG/ACT inhaler Inhale 1 puff into the lungs every 4 (four) hours as needed for wheezing. 08/10/19  Yes [provider]  amitriptyline (ELAVIL) 50 MG tablet Take 50 mg by mouth at bedtime.   Yes [provider]  apixaban (ELIQUIS) 2.5 MG TABS tablet Take 1 tablet (2.5 mg total) by mouth 2 (two) times daily. 04/07/19  Yes O'Neal, Ronnald Ramp, MD  benzonatate (TESSALON) 200 MG capsule Take 1 capsule (200 mg total) by mouth 3 (three) times daily as needed for cough. 01/30/19  Yes Rolly Salter, MD  carvedilol (COREG) 12.5 MG tablet Take 1 tablet (12.5 mg total) by mouth 2 (two) times daily with a meal. 04/07/19  Yes O'Neal, Ronnald Ramp, MD  Dextromethorphan-guaiFENesin (DELSYM COUGH/CHEST CONGEST DM PO) Take 1 tablet by mouth every 4 (four) hours as needed (cough,congestion).   Yes [provider]  furosemide (  LASIX) 20 MG tablet Take 1 tablet (20 mg total) by mouth daily as needed for fluid or edema (Weight gain of more than 3 pounds in 1 day or 5 pounds in 2 days). 01/30/19 01/30/20 Yes Rolly Salter, MD  ipratropium-albuterol (DUONEB) 0.5-2.5 (3) MG/3ML SOLN Inhale 3 mLs into the lungs 2 (two) times daily as needed for cough. 08/08/19  Yes [provider]  levalbuterol (XOPENEX HFA) 45 MCG/ACT inhaler Inhale 1 puff into the lungs every 4 (four) hours as needed for wheezing. 01/30/19 01/30/20 Yes Rolly Salter, MD  levothyroxine (SYNTHROID) 75 MCG tablet Take 1 tablet (75 mcg total) by mouth daily at 6 (six) AM. 01/31/19  Yes Rolly Salter, MD  Multiple Vitamin (MULTIVITAMIN) tablet Take 1 tablet  by mouth daily.     Yes [provider]  nitrofurantoin, macrocrystal-monohydrate, (MACROBID) 100 MG capsule Take 100 mg by mouth 2 (two) times daily. 08/31/19  Yes [provider]  omeprazole (PRILOSEC) 20 MG capsule Take 20 mg by mouth daily as needed (acid reflux).    Yes [provider]  predniSONE (DELTASONE) 1 MG tablet Take 3 mg by mouth daily.    Yes [provider]  tiotropium (SPIRIVA HANDIHALER) 18 MCG inhalation capsule PLACE 1 CAPSULE INTO THE INHALER AND INHALE DAILY Patient taking differently: Place 18 mcg into inhaler and inhale daily.  08/01/19  Yes Parrett, Virgel Bouquet, NP  Spacer/Aero-Holding Chambers (AEROCHAMBER MV) inhaler Use as instructed 08/01/19   Julio Sicks, NP    Physical Exam: Vitals:   09/01/19 1900 09/01/19 2015 09/01/19 2030 09/01/19 2100  BP: (!) 151/58 (!) 144/65 (!) 147/79 134/74  Pulse: (!) 111 (!) 108 (!) 108 (!) 104  Resp: (!) 21 18 16 18   Temp:      TempSrc:      SpO2: 97% 100% 100% 98%    Constitutional: NAD, calm, comfortable Eyes: PERRL, lids and conjunctivae normal ENMT: Mucous membranes are moist. Posterior pharynx clear of any exudate or lesions.Normal dentition.  Neck: normal, supple, no masses, no thyromegaly Respiratory: clear to auscultation bilaterally, no wheezing, no crackles. Normal respiratory effort. No accessory muscle use.  Cardiovascular: Regular rate and rhythm, no murmurs / rubs / gallops. No extremity edema. 2+ pedal pulses. No carotid bruits.  Abdomen: no tenderness, no masses palpated. No hepatosplenomegaly. Bowel sounds positive.  Musculoskeletal: no clubbing / cyanosis. No joint deformity upper and lower extremities. Good ROM, no contractures. Normal muscle tone.  Skin: no rashes, lesions, ulcers. No induration Neurologic: MAE, ? Expressive aphasia, ? LUE weakness compared to RUE.  BLE are 5/5.  No facial droop, tongue protrusion midline. Psychiatric: Following commands, answering yes/no  questions, not really able to assess further.   Labs on Admission: I have personally reviewed following labs and imaging studies  CBC: Recent Labs  Lab 09/01/19 1859  WBC 21.8*  HGB 12.2  HCT 38.8  MCV 97.7  PLT 324   Basic Metabolic Panel: Recent Labs  Lab 09/01/19 1859  NA 137  K 4.6  CL 98  CO2 32  GLUCOSE 122*  BUN 23  CREATININE 1.10*  CALCIUM 8.9   GFR: CrCl cannot be calculated (Unknown ideal weight.). Liver Function Tests: Recent Labs  Lab 09/01/19 1859  AST 42*  ALT 26  ALKPHOS 115  BILITOT 0.7  PROT 7.6  ALBUMIN 3.6   No results for input(s): LIPASE, AMYLASE in the last 168 hours. No results for input(s): AMMONIA in the last 168 hours. Coagulation Profile:  Recent Labs  Lab 09/01/19 1859  INR 1.4*   Cardiac Enzymes: No results for input(s): CKTOTAL, CKMB, CKMBINDEX, TROPONINI in the last 168 hours. BNP (last 3 results) No results for input(s): PROBNP in the last 8760 hours. HbA1C: No results for input(s): HGBA1C in the last 72 hours. CBG: No results for input(s): GLUCAP in the last 168 hours. Lipid Profile: No results for input(s): CHOL, HDL, LDLCALC, TRIG, CHOLHDL, LDLDIRECT in the last 72 hours. Thyroid Function Tests: No results for input(s): TSH, T4TOTAL, FREET4, T3FREE, THYROIDAB in the last 72 hours. Anemia Panel: No results for input(s): VITAMINB12, FOLATE, FERRITIN, TIBC, IRON, RETICCTPCT in the last 72 hours. Urine analysis:    Component Value Date/Time   COLORURINE YELLOW 09/01/2019 1859   APPEARANCEUR CLEAR 09/01/2019 1859   LABSPEC 1.016 09/01/2019 1859   PHURINE 5.0 09/01/2019 1859   GLUCOSEU NEGATIVE 09/01/2019 1859   HGBUR MODERATE (A) 09/01/2019 1859   BILIRUBINUR NEGATIVE 09/01/2019 1859   KETONESUR NEGATIVE 09/01/2019 1859   PROTEINUR NEGATIVE 09/01/2019 1859   NITRITE NEGATIVE 09/01/2019 1859   LEUKOCYTESUR NEGATIVE 09/01/2019 1859    Radiological Exams on Admission: CT HEAD WO CONTRAST  Result Date:  09/01/2019 CLINICAL DATA:  Confusion and altered mental status EXAM: CT HEAD WITHOUT CONTRAST TECHNIQUE: Contiguous axial images were obtained from the base of the skull through the vertex without intravenous contrast. COMPARISON:  January 24, 2019 FINDINGS: Brain: Mild diffuse atrophy is stable. There is no intracranial mass, hemorrhage, extra-axial fluid collection, or midline shift. There is patchy small vessel disease in the centra semiovale bilaterally. No acute infarct is demonstrable. Vascular: There is no hyperdense vessel. There is calcification in each carotid siphon region. Skull: The bony calvarium appears intact. Sinuses/Orbits: There is mild mucosal thickening in several ethmoid air cells. Other paranasal sinuses are clear. Orbits appear symmetric bilaterally. Other: Mastoid air cells are clear. IMPRESSION: Mild atrophy with patchy periventricular small vessel disease. No demonstrable acute infarct. No mass or hemorrhage. There are foci of arterial vascular calcification. There is mucosal thickening in several ethmoid air cells. Electronically Signed   By: Lowella Grip III M.D.   On: 09/01/2019 20:07   MR BRAIN WO CONTRAST  Result Date: 09/01/2019 CLINICAL DATA:  Encephalopathy EXAM: MRI HEAD WITHOUT CONTRAST TECHNIQUE: Multiplanar, multiecho pulse sequences of the brain and surrounding structures were obtained without intravenous contrast. COMPARISON:  Head CT 1521 FINDINGS: Brain: No acute infarct, acute hemorrhage or extra-axial collection. Early confluent hyperintense T2-weighted signal of the periventricular and deep white matter, most commonly due to chronic ischemic microangiopathy. There is generalized atrophy without lobar predilection. No chronic microhemorrhage. Normal midline structures. Vascular: Normal flow voids. Skull and upper cervical spine: Normal marrow signal. Sinuses/Orbits: Negative. Other: None. IMPRESSION: 1. No acute intracranial abnormality. 2. Findings of chronic  ischemic microangiopathy. Electronically Signed   By: Ulyses Jarred M.D.   On: 09/01/2019 22:36   DG Chest Port 1 View  Result Date: 09/01/2019 CLINICAL DATA:  Altered mental status EXAM: PORTABLE CHEST 1 VIEW COMPARISON:  01/26/2019 FINDINGS: Cardiac shadow is within normal limits. Aortic calcifications are seen. Vascular congestion is noted although significantly improved when compared with the prior study. No significant edema is noted. No sizable effusion is noted. No acute bony abnormality is seen. IMPRESSION: Mild vascular congestion although significantly improved when compared with the prior exam. No edema is noted at this time. Electronically Signed   By: Inez Catalina M.D.   On: 09/01/2019 19:34    EKG: Independently reviewed.  Assessment/Plan Principal Problem:   Acute metabolic encephalopathy Active Problems:   Essential hypertension   Current chronic use of systemic steroids   Atrial fibrillation (HCC)   Leukocytosis    1. Acute metabolic encephalopathy - 1. Initially was concerned for possible acute stroke with expressive aphasia; however, MRI brain is negative for acute findings. 2. ? Partially treated or "masked" UTI as cause? 1. UCx pending 2. Got dose of rocephin in ED after 1930 3. Check BCx 4. Defer wether to re dose rocephin or not tomorrow to daytime hospitalist. 5. Will hold macrobid for the moment 3. TSH pending 4. Ammonia pending 5. Repeat trop pending 6. Check procalcitonin 7. COVID pending 8. Will check CT abd/pelvis to see if we can find any other source for this leukocytosis and mild tachycardia 2. Chronic steroid use - 1. Will "tripple up" on chronic steroids in case her AMS is manifestation of relative adrenal insufficiency in setting of acute illness 2. So changing prednisone to 9mg  daily for the moment 3. Dont see a need at this time for full stress dose steroids 3. HTN - 1. Cont coreg 4. A.Fib - 1. Cont coreg 2. Cont Eliquis 3. Tele  monitor  DVT prophylaxis: Lovenox Code Status: DNR - confirmed with daughter Family Communication: Spoke with daughter on phone Disposition Plan: Home after AMS resolved Consults called: None Admission status: Place in   Marijane Trower M. DO [facility]  How to contact the [facility] Attending or Consulting provider 7A - 7P or covering provider during after hours 7P -7A, for this patient?  1. Check the care team in [facility] and look for a) attending/consulting [facility] provider listed and b) the [facility] team listed 2. Log into [email or web address]  Amion Physician Scheduling and messaging for groups and whole hospitals  On call and physician scheduling software for group practices, residents, hospitalists and other medical providers for call, clinic, rotation and shift schedules. On[employer] is a hospital-wide system for scheduling doctors and paging doctors on call. EasyPlot is for scientific plotting and data analysis.  [email or web address]  and use [facility]'s universal password to access. If you do not have the password, please contact the hospital operator.  3. Locate the [facility] provider you are looking for under [facility] and page to a number that you can be directly reached. 4. If you still have difficulty reaching the provider, please page the [facility] (Director on Call) for the Hospitalists listed on amion for assistance.  09/01/2019, 10:39 PM

## 2019-09-01 NOTE — ED Provider Notes (Addendum)
[facility]ER[facility]Y DEPT [facility] Emergency Department Provider Note MRN:  [id number]  Arrival date & time: 09/01/19     Chief Complaint   Weakness and Altered Mental Status   History of Present Illness   Jenna Carrillo is a 86 y.o. year-old female with a history of COPD presenting to the ED with chief complaint of altered mental status.  Patient was complaining of dysuria yesterday, today did not get out of bed, increased altered mental status, complaining of lightheadedness.  I was unable to obtain an accurate HPI, PMH, or ROS due to the patient's altered mental status.  Level 5 caveat.  Review of Systems  A complete 10 system review of systems was obtained and all systems are negative except as noted in the HPI and PMH.   Patient's Health History    Past Medical History:  Diagnosis Date  . Bronchiectasis   . COPD (chronic obstructive pulmonary disease) (HCC)   . Degenerative disk disease   . Eczema   . Gastric ulcer   . GERD (gastroesophageal reflux disease)   . Hiatal hernia   . Hyperlipidemia   . Hypertension   . Hypothyroidism   . Osteoarthritis   . Polymyalgia rheumatica (HCC)   . Pulmonary nodule, right   . Vertebrobasilar insufficiency     Past Surgical History:  Procedure Laterality Date  . BREAST CYST EXCISION     right  . broken tail bone    . CAROTID ENDARTERECTOMY     right  . RHINOPLASTY    . TOTAL ABDOMINAL HYSTERECTOMY      Family History  Problem Relation Age of Onset  . COPD Mother   . Breast cancer Mother   . Emphysema Mother   . Liver disease Son   . Emphysema Father   . Heart attack Father     Social History   Socioeconomic History  . Marital status: Widowed    Spouse name: Not on file  . Number of children: Not on file  . Years of education: Not on file  . Highest education level: Not on file  Occupational History  . Occupation: Retired    Comment: staining glass  Tobacco Use  . Smoking status: Former Smoker     Packs/day: 2.00    Years: 2.00    Pack years: 4.00    Types: Cigarettes    Quit date: 05/19/1958    Years since quitting: 61.3  . Smokeless tobacco: Never Used  Substance and Sexual Activity  . Alcohol use: No  . Drug use: Never  . Sexual activity: Not on file  Other Topics Concern  . Not on file  Social History Narrative   Patient lives in [place], with her son who is bedridden from multiple sclerosis.   Daughter helps in the care and is planning to move both of them in with her, once everyone is stable.   Social Determinants of Health   Financial Resource Strain:   . Difficulty of Paying Living Expenses:   Food Insecurity:   . Worried About Programme researcher, broadcasting/film/video in the Last Year:   . Barista in the Last Year:   Transportation Needs:   . Freight forwarder (Medical):   Marland Kitchen Lack of Transportation (Non-Medical):   Physical Activity:   . Days of Exercise per Week:   . Minutes of Exercise per Session:   Stress:   . Feeling of Stress :   Social Connections:   . Frequency of Communication with  Friends and Family:   . Frequency of Social Gatherings with Friends and Family:   . Attends Religious Services:   . Active Member of Clubs or Organizations:   . Attends Banker Meetings:   Marland Kitchen Marital Status:   Intimate Partner Violence:   . Fear of Current or Ex-Partner:   . Emotionally Abused:   Marland Kitchen Physically Abused:   . Sexually Abused:      Physical Exam   Vitals:   09/01/19 2015 09/01/19 2030  BP: (!) 144/65 (!) 147/79  Pulse: (!) 108 (!) 108  Resp: 18 16  Temp:    SpO2: 100% 100%    CONSTITUTIONAL: Chronically ill-appearing, NAD NEURO: Awake, answers some yes or no questions but is not oriented to name or place or time.  Moving all extremities. EYES:  eyes equal and reactive ENT/NECK:  no LAD, no JVD CARDIO: Tachycardic rate, well-perfused, normal S1 and S2 PULM:  CTAB no wheezing or rhonchi GI/GU:  normal bowel sounds, non-distended,  non-tender MSK/SPINE:  No gross deformities, no edema SKIN:  no rash, atraumatic PSYCH:  Appropriate speech and behavior  *Additional and/or pertinent findings included in MDM below  Diagnostic and Interventional Summary    EKG Interpretation  Date/Time:  Thursday September 01 2019 18:21:26 EDT Ventricular Rate:  115 PR Interval:    QRS Duration: 81 QT Interval:  301 QTC Calculation: 417 R Axis:   54 Text Interpretation: Sinus tachycardia Biatrial enlargement Confirmed by Kennis Carina [phone]) on 09/01/2019 7:21:04 PM      Labs Reviewed  CBC - Abnormal; Notable for the following components:      Result Value   WBC 21.8 (*)    All other components within normal limits  COMPREHENSIVE METABOLIC PANEL - Abnormal; Notable for the following components:   Glucose, Bld 122 (*)    Creatinine, Ser 1.10 (*)    AST 42 (*)    GFR calc non Af Amer 45 (*)    GFR calc Af Amer 53 (*)    All other components within normal limits  URINALYSIS, ROUTINE W REFLEX MICROSCOPIC - Abnormal; Notable for the following components:   Hgb urine dipstick MODERATE (*)    Bacteria, UA RARE (*)    All other components within normal limits  PROTIME-INR - Abnormal; Notable for the following components:   Prothrombin Time 16.6 (*)    INR 1.4 (*)    All other components within normal limits  TROPONIN I (HIGH SENSITIVITY) - Abnormal; Notable for the following components:   Troponin I (High Sensitivity) 27 (*)    All other components within normal limits    CT HEAD WO CONTRAST  Final Result    DG Chest Port 1 View  Final Result      Medications  sodium chloride 0.9 % bolus 500 mL (500 mLs Intravenous New Bag/Given (Non-Interop) 09/01/19 1926)  cefTRIAXone (ROCEPHIN) 2 g in sodium chloride 0.9 % 100 mL IVPB (0 g Intravenous Stopped 09/01/19 2029)     Procedures  /  Critical Care Procedures  ED Course and Medical Decision Making  I have reviewed the triage vital signs, the nursing notes, and pertinent  available records from the EMR.  Listed above are laboratory and imaging tests that I personally ordered, reviewed, and interpreted and then considered in my medical decision making (see below for details).      Suspect UTI with altered mental status, also considering CNS etiology.  Last known normal yesterday evening before going to bed,  exact time unclear.  Patient is anticoagulated.  Will obtain CT head, basic labs, chest x-ray given report of chronic cough for the past several weeks.  Anticipating need for admission given her significant AMS which is off her baseline.  Work-up is largely unrevealing, CT head normal, urinalysis without obvious signs of infection.  Upon reassessment patient continues to have a non-focal exam, can now state her name and follow some commands.  Will admit to hospitalist service for further testing and management given her continued altered mental status.  Barth Kirks. Sedonia Small, [place] [email or web address]  Final Clinical Impressions(s) / ED Diagnoses     ICD-10-CM   1. Altered mental status, unspecified altered mental status type  R41.82     ED Discharge Orders    None       Discharge Instructions Discussed with and Provided to Patient:   Discharge Instructions   None       Maudie Flakes, MD 09/01/19 2041    Maudie Flakes, MD 09/01/19 [phone]

## 2019-09-01 NOTE — ED Notes (Signed)
Pt was seen trying to get out of bed. She was placed back in the bed with a fall risk bracelet and yellow socks placed.

## 2019-09-02 DIAGNOSIS — D72829 Elevated white blood cell count, unspecified: Secondary | ICD-10-CM | POA: Diagnosis not present

## 2019-09-02 DIAGNOSIS — I10 Essential (primary) hypertension: Secondary | ICD-10-CM | POA: Diagnosis not present

## 2019-09-02 DIAGNOSIS — I5181 Takotsubo syndrome: Secondary | ICD-10-CM | POA: Diagnosis present

## 2019-09-02 DIAGNOSIS — Z20822 Contact with and (suspected) exposure to covid-19: Secondary | ICD-10-CM | POA: Diagnosis present

## 2019-09-02 DIAGNOSIS — I158 Other secondary hypertension: Secondary | ICD-10-CM | POA: Diagnosis present

## 2019-09-02 DIAGNOSIS — N390 Urinary tract infection, site not specified: Secondary | ICD-10-CM | POA: Diagnosis present

## 2019-09-02 DIAGNOSIS — G450 Vertebro-basilar artery syndrome: Secondary | ICD-10-CM | POA: Diagnosis present

## 2019-09-02 DIAGNOSIS — K449 Diaphragmatic hernia without obstruction or gangrene: Secondary | ICD-10-CM | POA: Diagnosis present

## 2019-09-02 DIAGNOSIS — I480 Paroxysmal atrial fibrillation: Secondary | ICD-10-CM | POA: Diagnosis present

## 2019-09-02 DIAGNOSIS — E039 Hypothyroidism, unspecified: Secondary | ICD-10-CM | POA: Diagnosis present

## 2019-09-02 DIAGNOSIS — Z7901 Long term (current) use of anticoagulants: Secondary | ICD-10-CM | POA: Diagnosis not present

## 2019-09-02 DIAGNOSIS — I4891 Unspecified atrial fibrillation: Secondary | ICD-10-CM | POA: Diagnosis not present

## 2019-09-02 DIAGNOSIS — T380X5A Adverse effect of glucocorticoids and synthetic analogues, initial encounter: Secondary | ICD-10-CM | POA: Diagnosis present

## 2019-09-02 DIAGNOSIS — Z87891 Personal history of nicotine dependence: Secondary | ICD-10-CM | POA: Diagnosis not present

## 2019-09-02 DIAGNOSIS — E2740 Unspecified adrenocortical insufficiency: Secondary | ICD-10-CM | POA: Diagnosis present

## 2019-09-02 DIAGNOSIS — R4182 Altered mental status, unspecified: Secondary | ICD-10-CM | POA: Diagnosis present

## 2019-09-02 DIAGNOSIS — M1990 Unspecified osteoarthritis, unspecified site: Secondary | ICD-10-CM | POA: Diagnosis present

## 2019-09-02 DIAGNOSIS — Z8711 Personal history of peptic ulcer disease: Secondary | ICD-10-CM | POA: Diagnosis not present

## 2019-09-02 DIAGNOSIS — J439 Emphysema, unspecified: Secondary | ICD-10-CM | POA: Diagnosis not present

## 2019-09-02 DIAGNOSIS — J471 Bronchiectasis with (acute) exacerbation: Secondary | ICD-10-CM | POA: Diagnosis present

## 2019-09-02 DIAGNOSIS — J479 Bronchiectasis, uncomplicated: Secondary | ICD-10-CM

## 2019-09-02 DIAGNOSIS — Z882 Allergy status to sulfonamides status: Secondary | ICD-10-CM | POA: Diagnosis not present

## 2019-09-02 DIAGNOSIS — E785 Hyperlipidemia, unspecified: Secondary | ICD-10-CM | POA: Diagnosis present

## 2019-09-02 DIAGNOSIS — I5032 Chronic diastolic (congestive) heart failure: Secondary | ICD-10-CM | POA: Diagnosis present

## 2019-09-02 DIAGNOSIS — J9601 Acute respiratory failure with hypoxia: Secondary | ICD-10-CM | POA: Diagnosis present

## 2019-09-02 DIAGNOSIS — Z66 Do not resuscitate: Secondary | ICD-10-CM | POA: Diagnosis present

## 2019-09-02 DIAGNOSIS — G9341 Metabolic encephalopathy: Secondary | ICD-10-CM | POA: Diagnosis present

## 2019-09-02 DIAGNOSIS — K219 Gastro-esophageal reflux disease without esophagitis: Secondary | ICD-10-CM | POA: Diagnosis present

## 2019-09-02 DIAGNOSIS — Z7952 Long term (current) use of systemic steroids: Secondary | ICD-10-CM | POA: Diagnosis not present

## 2019-09-02 DIAGNOSIS — Z90710 Acquired absence of both cervix and uterus: Secondary | ICD-10-CM | POA: Diagnosis not present

## 2019-09-02 DIAGNOSIS — M353 Polymyalgia rheumatica: Secondary | ICD-10-CM | POA: Diagnosis present

## 2019-09-02 LAB — CBC
HCT: 38.6 % (ref 36.0–46.0)
Hemoglobin: 12.1 g/dL (ref 12.0–15.0)
MCH: 30.7 pg (ref 26.0–34.0)
MCHC: 31.3 g/dL (ref 30.0–36.0)
MCV: 98 fL (ref 80.0–100.0)
Platelets: 311 10*3/uL (ref 150–400)
RBC: 3.94 MIL/uL (ref 3.87–5.11)
RDW: 13.3 % (ref 11.5–15.5)
WBC: 22.3 10*3/uL — ABNORMAL HIGH (ref 4.0–10.5)
nRBC: 0 % (ref 0.0–0.2)

## 2019-09-02 LAB — BASIC METABOLIC PANEL
Anion gap: 9 (ref 5–15)
BUN: 17 mg/dL (ref 8–23)
CO2: 30 mmol/L (ref 22–32)
Calcium: 8.9 mg/dL (ref 8.9–10.3)
Chloride: 97 mmol/L — ABNORMAL LOW (ref 98–111)
Creatinine, Ser: 1.01 mg/dL — ABNORMAL HIGH (ref 0.44–1.00)
GFR calc Af Amer: 58 mL/min — ABNORMAL LOW (ref >60)
GFR calc non Af Amer: 50 mL/min — ABNORMAL LOW (ref >60)
Glucose, Bld: 107 mg/dL — ABNORMAL HIGH (ref 70–99)
Potassium: 4 mmol/L (ref 3.5–5.1)
Sodium: 136 mmol/L (ref 135–145)

## 2019-09-02 LAB — PROCALCITONIN
Procalcitonin: 1.43 ng/mL
Procalcitonin: 1.62 ng/mL

## 2019-09-02 LAB — TROPONIN I (HIGH SENSITIVITY)
Troponin I (High Sensitivity): 47 ng/L — ABNORMAL HIGH (ref <18)
Troponin I (High Sensitivity): 57 ng/L — ABNORMAL HIGH (ref <18)

## 2019-09-02 LAB — TSH: TSH: 0.996 u[IU]/mL (ref 0.350–4.500)

## 2019-09-02 LAB — SARS CORONAVIRUS 2 (TAT 6-24 HRS): SARS Coronavirus 2: NEGATIVE

## 2019-09-02 MED ORDER — SODIUM CHLORIDE 0.9 % IV SOLN
1.0000 g | INTRAVENOUS | Status: DC
  Administered 2019-09-02 – 2019-09-03 (×2): 1 g via INTRAVENOUS
  Filled 2019-09-02 (×2): qty 1
  Filled 2019-09-02: qty 10

## 2019-09-02 MED ORDER — UMECLIDINIUM BROMIDE 62.5 MCG/INH IN AEPB
1.0000 | INHALATION_SPRAY | Freq: Every day | RESPIRATORY_TRACT | Status: DC
  Administered 2019-09-02 – 2019-09-05 (×4): 1 via RESPIRATORY_TRACT
  Filled 2019-09-02: qty 7

## 2019-09-02 MED ORDER — AZITHROMYCIN 250 MG PO TABS
250.0000 mg | ORAL_TABLET | Freq: Every day | ORAL | Status: DC
  Administered 2019-09-02 – 2019-09-05 (×4): 250 mg via ORAL
  Filled 2019-09-02 (×4): qty 1

## 2019-09-02 NOTE — ED Notes (Signed)
Hospitalist at bedside 

## 2019-09-02 NOTE — Progress Notes (Signed)
PROGRESS NOTE  Jenna Carrillo QIO:[id number] DOB: [birth year] DOA: 09/01/2019 PCP: Creola Corn, MD   LOS: 0 days   Brief narrative: As per HPI,  Jenna Carrillo is a 86 y.o. female with medical history significant of A.Fib on eliquis, HTN, hypothyroidism, on chronic steroids, PMR presented to the hospital with complaints of altered mental status.  Patient had complained of dysuria and weakness and difficulty getting out of the bed at home with dizziness, disoriented.  She was recently started on Macrobid for presumed UTI.  She was only able to answer yes and no questions in the ED.  In the ED, patient was noted to have WBC 21k, HR 100-110.  Trop 27, CT head neg, CXR neg.  UA with mod HGB, neg LE, neg nitrites, and 0-5 WBC.  She received Rocephin in the ED including 500 mL of normal saline bolus then was admitted to hospital.  Assessment/Plan:  Principal Problem:   Acute metabolic encephalopathy Active Problems:   Essential hypertension   Current chronic use of systemic steroids   Atrial fibrillation (HCC)   Leukocytosis  Acute metabolic encephalopathy  Initial concern for possible stroke due to concern for aphasia.  MRI was negative.  Possibility of a partially treated UTI.  Had a significantly elevated leukocytosis on presentation.  We will continue with IV Rocephin.  Follow urine cultures, blood cultures.  T-max of 98.7 F.  CT scan of the abdomen and pelvis showing right middle lobe bronchiectasis.  We will continue Rocephin.  Add Zithromax as well.  Continue oxygen, nebulizers as needed.  Currently on 3 L of oxygen by nasal cannula. At baseline, patient does have good and bad days and sometimes says yes or no questions at baseline. She does bath, feed and walks independently. Lives with daughter.  WBC at 22.3.  Ammonia of 16.  Partially treated UTI.  Will continue rocephin, follow urine culture.  Procalcitonin 1.6.  Right middle lobe bronchiectasis, history of copd.  Add zithromax.  Had been coughing at home.  Uses cough meds, nebs at home.  On 3 L of oxygen at the ED.  We will continue to wean as able.  PMR on chronic steroids.  Currently at prednisone 9 mg daily.  Leuko-cytosis could be secondary to steroids as well.  Hypothyroidism.  Continue Synthroid.  TSH within normal limits.  Essential hypertension.  Continue Coreg.  Closely monitor blood pressure.  Blood pressure is stable at this time.  Atrial fibrillation.  Continue Coreg Eliquis.  Rate controlled at the time of my evaluation.  Debility, weakness. At home, will get PT evaluation.  Normally patient is able to ambulate at home by herself.  Patient was unable to get around yesterday.  VTE Prophylaxis: Eliquis  Code Status: DNR  Family Communication:  I spoke with the patient's daughter on the phone and updated her about the clinical condition of the patient.   Status is: Observation  The patient will require care spanning > 2 midnights and should be moved to inpatient because: Altered mental status, Unsafe d/c plan and Inpatient level of care appropriate due to severity of illness, continue IV antibiotics, follow urine culture blood cultures.  Will need PT evaluation prior to discharge, monitor leukocytosis, on supplemental oxygen.  Dispo: The patient is from: Home              Anticipated d/c is to: Home              Anticipated d/c date is: 2 days  Patient currently is not medically stable to d/c.   Consultants:  None  Procedures:  None  Antibiotics:  . Rocephin IV  Anti-infectives (From admission, onward)   Start     Dose/Rate Route Frequency Ordered Stop   09/01/19 2230  nitrofurantoin (macrocrystal-monohydrate) (MACROBID) capsule 100 mg  Status:  Discontinued     100 mg Oral 2 times daily 09/01/19 2219 09/01/19 2236   09/01/19 1930  cefTRIAXone (ROCEPHIN) 2 g in sodium chloride 0.9 % 100 mL IVPB     2 g 200 mL/hr over 30 Minutes Intravenous  Once 09/01/19 1924 09/01/19 2029      Subjective: Today, patient was seen and examined at bedside.  Patient denies any shortness of breath cough nausea vomiting but is difficult to completely rely since she is most of the time answering yes or no.  Objective: Vitals:   09/02/19 1007 09/02/19 1015  BP: (!) 102/48   Pulse: 73   Resp:    Temp: 97.6 F (36.4 C)   SpO2: 100% 94%    Intake/Output Summary (Last 24 hours) at 09/02/2019 1111 Last data filed at 09/01/2019 2220 Gross per 24 hour  Intake 600 ml  Output --  Net 600 ml   There were no vitals filed for this visit. There is no height or weight on file to calculate BMI.   Physical Exam: GENERAL: Patient is alert awake communicative with yes or no answers, not in obvious distress.  Thinly built.  Oriented to place only.  On 3 L of oxygen by cannula.Marland Kitchen HENT: No scleral pallor or icterus. Pupils equally reactive to light. Oral mucosa is moist NECK: is supple, no gross swelling noted. CHEST: Decreased breath sounds bilaterall.y  No obvious wheezes noted.   CVS: S1 and S2 heard, no murmur. Regular rate and rhythm.  ABDOMEN: Soft, non-tender, bowel sounds are present. EXTREMITIES: No edema. CNS: Cranial nerves are intact.  Moving all extremities. SKIN: warm and dry without rashes.   Data Review: I have personally reviewed the following laboratory data and studies,  CBC: Recent Labs  Lab 09/01/19 1859 09/02/19 0500  WBC 21.8* 22.3*  HGB 12.2 12.1  HCT 38.8 38.6  MCV 97.7 98.0  PLT 324 102   Basic Metabolic Panel: Recent Labs  Lab 09/01/19 1859 09/02/19 0500  NA 137 136  K 4.6 4.0  CL 98 97*  CO2 32 30  GLUCOSE 122* 107*  BUN 23 17  CREATININE 1.10* 1.01*  CALCIUM 8.9 8.9   Liver Function Tests: Recent Labs  Lab 09/01/19 1859  AST 42*  ALT 26  ALKPHOS 115  BILITOT 0.7  PROT 7.6  ALBUMIN 3.6   No results for input(s): LIPASE, AMYLASE in the last 168 hours. Recent Labs  Lab 09/01/19 2248  AMMONIA 16   Cardiac Enzymes: No results  for input(s): CKTOTAL, CKMB, CKMBINDEX, TROPONINI in the last 168 hours. BNP (last 3 results) Recent Labs    01/29/19 0522  BNP 1,930.6*    ProBNP (last 3 results) No results for input(s): PROBNP in the last 8760 hours.  CBG: No results for input(s): GLUCAP in the last 168 hours. Recent Results (from the past 240 hour(s))  SARS CORONAVIRUS 2 (TAT 6-24 HRS) Nasopharyngeal Nasopharyngeal Swab     Status: None   Collection Time: 09/01/19  8:42 PM   Specimen: Nasopharyngeal Swab  Result Value Ref Range Status   SARS Coronavirus 2 NEGATIVE NEGATIVE Final    Comment: (NOTE) SARS-CoV-2 target nucleic acids are NOT DETECTED.  The SARS-CoV-2 RNA is generally detectable in upper and lower respiratory specimens during the acute phase of infection. Negative results do not preclude SARS-CoV-2 infection, do not rule out co-infections with other pathogens, and should not be used as the sole basis for treatment or other patient management decisions. Negative results must be combined with clinical observations, patient history, and epidemiological information. The expected result is Negative. Fact Sheet for Patients: HairSlick.no Fact Sheet for Healthcare Providers: quierodirigir.com This test is not yet approved or cleared by the [place] FDA and  has been authorized for detection and/or diagnosis of SARS-CoV-2 by FDA under an Emergency Use Authorization (EUA). This EUA will remain  in effect (meaning this test can be used) for the duration of the COVID-19 declaration under Section 56 4(b)(1) of the Act, 21 U.S.C. section 360bbb-3(b)(1), unless the authorization is terminated or revoked sooner. Performed at [facility], [address], [place], [place] [address]      Studies: CT HEAD WO CONTRAST  Result Date: 09/01/2019 CLINICAL DATA:  Confusion and altered mental status EXAM: CT HEAD WITHOUT CONTRAST TECHNIQUE:  Contiguous axial images were obtained from the base of the skull through the vertex without intravenous contrast. COMPARISON:  January 24, 2019 FINDINGS: Brain: Mild diffuse atrophy is stable. There is no intracranial mass, hemorrhage, extra-axial fluid collection, or midline shift. There is patchy small vessel disease in the centra semiovale bilaterally. No acute infarct is demonstrable. Vascular: There is no hyperdense vessel. There is calcification in each carotid siphon region. Skull: The bony calvarium appears intact. Sinuses/Orbits: There is mild mucosal thickening in several ethmoid air cells. Other paranasal sinuses are clear. Orbits appear symmetric bilaterally. Other: Mastoid air cells are clear. IMPRESSION: Mild atrophy with patchy periventricular small vessel disease. No demonstrable acute infarct. No mass or hemorrhage. There are foci of arterial vascular calcification. There is mucosal thickening in several ethmoid air cells. Electronically Signed   By: Bretta Bang III M.D.   On: 09/01/2019 20:07   MR BRAIN WO CONTRAST  Result Date: 09/01/2019 CLINICAL DATA:  Encephalopathy EXAM: MRI HEAD WITHOUT CONTRAST TECHNIQUE: Multiplanar, multiecho pulse sequences of the brain and surrounding structures were obtained without intravenous contrast. COMPARISON:  Head CT 1521 FINDINGS: Brain: No acute infarct, acute hemorrhage or extra-axial collection. Early confluent hyperintense T2-weighted signal of the periventricular and deep white matter, most commonly due to chronic ischemic microangiopathy. There is generalized atrophy without lobar predilection. No chronic microhemorrhage. Normal midline structures. Vascular: Normal flow voids. Skull and upper cervical spine: Normal marrow signal. Sinuses/Orbits: Negative. Other: None. IMPRESSION: 1. No acute intracranial abnormality. 2. Findings of chronic ischemic microangiopathy. Electronically Signed   By: Deatra Robinson M.D.   On: 09/01/2019 22:36   CT  ABDOMEN PELVIS W CONTRAST  Result Date: 09/01/2019 CLINICAL DATA:  Urinary frequency, weakness, confusion EXAM: CT ABDOMEN AND PELVIS WITH CONTRAST TECHNIQUE: Multidetector CT imaging of the abdomen and pelvis was performed using the standard protocol following bolus administration of intravenous contrast. CONTRAST:  76mL OMNIPAQUE IOHEXOL 300 MG/ML  SOLN COMPARISON:  01/26/2019 FINDINGS: Lower chest: There is a trace right pleural effusion. Mild vascular congestion. There is right middle lobe bronchiectasis, with right basilar bronchial wall thickening. No acute airspace disease or pneumothorax. Hepatobiliary: No focal liver abnormality is seen. No gallstones, gallbladder wall thickening, or biliary dilatation. Pancreas: Unremarkable. No pancreatic ductal dilatation or surrounding inflammatory changes. Spleen: Normal in size without focal abnormality. Adrenals/Urinary Tract: Adrenal glands are unremarkable. Kidneys are normal, without renal calculi,  focal lesion, or hydronephrosis. Bladder is unremarkable. Stomach/Bowel: Moderate retained stool throughout the colon. No bowel obstruction or ileus. No bowel wall thickening or inflammatory change. Vascular/Lymphatic: Aortic atherosclerosis. No enlarged abdominal or pelvic lymph nodes. Reproductive: Status post hysterectomy. No adnexal masses. Other: Trace free fluid in the pelvis is nonspecific. No free gas. No abdominal wall hernia. Musculoskeletal: No acute or destructive bony lesions. Minimal grade 1 anterolisthesis of L5 on S1 likely due to facet hypertrophy. Reconstructed images demonstrate no additional findings. IMPRESSION: 1. Right middle lobe bronchiectasis with right basilar bronchial wall thickening, which could reflect reactive airway disease or bronchitis. 2. Trace right pleural effusion. 3. Trace pelvic free fluid, nonspecific. No other acute intra-abdominal or intrapelvic process. 4. Moderate fecal retention. 5.  Aortic Atherosclerosis (ICD10-I70.0).  Electronically Signed   By: Sharlet Salina M.D.   On: 09/01/2019 23:31   DG Chest Port 1 View  Result Date: 09/01/2019 CLINICAL DATA:  Altered mental status EXAM: PORTABLE CHEST 1 VIEW COMPARISON:  01/26/2019 FINDINGS: Cardiac shadow is within normal limits. Aortic calcifications are seen. Vascular congestion is noted although significantly improved when compared with the prior study. No significant edema is noted. No sizable effusion is noted. No acute bony abnormality is seen. IMPRESSION: Mild vascular congestion although significantly improved when compared with the prior exam. No edema is noted at this time. Electronically Signed   By: Alcide Clever M.D.   On: 09/01/2019 19:34      Joycelyn Das, MD  [facility] 09/02/2019

## 2019-09-03 DIAGNOSIS — J471 Bronchiectasis with (acute) exacerbation: Secondary | ICD-10-CM | POA: Diagnosis not present

## 2019-09-03 DIAGNOSIS — I4891 Unspecified atrial fibrillation: Secondary | ICD-10-CM | POA: Diagnosis not present

## 2019-09-03 DIAGNOSIS — G9341 Metabolic encephalopathy: Secondary | ICD-10-CM | POA: Diagnosis not present

## 2019-09-03 DIAGNOSIS — J439 Emphysema, unspecified: Secondary | ICD-10-CM

## 2019-09-03 DIAGNOSIS — M353 Polymyalgia rheumatica: Secondary | ICD-10-CM

## 2019-09-03 LAB — CBC
HCT: 34.9 % — ABNORMAL LOW (ref 36.0–46.0)
Hemoglobin: 11.1 g/dL — ABNORMAL LOW (ref 12.0–15.0)
MCH: 30.7 pg (ref 26.0–34.0)
MCHC: 31.8 g/dL (ref 30.0–36.0)
MCV: 96.4 fL (ref 80.0–100.0)
Platelets: 267 10*3/uL (ref 150–400)
RBC: 3.62 MIL/uL — ABNORMAL LOW (ref 3.87–5.11)
RDW: 13.2 % (ref 11.5–15.5)
WBC: 12.3 10*3/uL — ABNORMAL HIGH (ref 4.0–10.5)
nRBC: 0 % (ref 0.0–0.2)

## 2019-09-03 LAB — COMPREHENSIVE METABOLIC PANEL
ALT: 27 U/L (ref 0–44)
AST: 35 U/L (ref 15–41)
Albumin: 2.8 g/dL — ABNORMAL LOW (ref 3.5–5.0)
Alkaline Phosphatase: 89 U/L (ref 38–126)
Anion gap: 6 (ref 5–15)
BUN: 26 mg/dL — ABNORMAL HIGH (ref 8–23)
CO2: 31 mmol/L (ref 22–32)
Calcium: 8.8 mg/dL — ABNORMAL LOW (ref 8.9–10.3)
Chloride: 100 mmol/L (ref 98–111)
Creatinine, Ser: 1.03 mg/dL — ABNORMAL HIGH (ref 0.44–1.00)
GFR calc Af Amer: 57 mL/min — ABNORMAL LOW (ref >60)
GFR calc non Af Amer: 49 mL/min — ABNORMAL LOW (ref >60)
Glucose, Bld: 87 mg/dL (ref 70–99)
Potassium: 3.6 mmol/L (ref 3.5–5.1)
Sodium: 137 mmol/L (ref 135–145)
Total Bilirubin: 0.4 mg/dL (ref 0.3–1.2)
Total Protein: 6.2 g/dL — ABNORMAL LOW (ref 6.5–8.1)

## 2019-09-03 LAB — URINE CULTURE: Culture: 50000 — AB

## 2019-09-03 LAB — PHOSPHORUS: Phosphorus: 3 mg/dL (ref 2.5–4.6)

## 2019-09-03 LAB — MAGNESIUM: Magnesium: 2.3 mg/dL (ref 1.7–2.4)

## 2019-09-03 MED ORDER — BENZONATATE 100 MG PO CAPS
100.0000 mg | ORAL_CAPSULE | Freq: Three times a day (TID) | ORAL | Status: DC | PRN
  Administered 2019-09-03: 100 mg via ORAL
  Filled 2019-09-03: qty 1

## 2019-09-03 MED ORDER — DEXTROMETHORPHAN-GUAIFENESIN 5-100 MG/5ML PO LIQD: ORAL | Status: DC | PRN

## 2019-09-03 MED ORDER — GUAIFENESIN-DM 100-10 MG/5ML PO SYRP
5.0000 mL | ORAL_SOLUTION | ORAL | Status: DC | PRN
  Administered 2019-09-03 – 2019-09-04 (×5): 5 mL via ORAL
  Filled 2019-09-03 (×5): qty 10

## 2019-09-03 MED ORDER — FUROSEMIDE 20 MG PO TABS
20.0000 mg | ORAL_TABLET | Freq: Every day | ORAL | Status: DC
  Administered 2019-09-03 – 2019-09-05 (×3): 20 mg via ORAL
  Filled 2019-09-03 (×3): qty 1

## 2019-09-03 NOTE — Progress Notes (Cosign Needed)
When getting pt's vitals, pt stated her PureWick was not placed correctly and her bed was wet. Linens changed, peri care completed and new PureWick placed. Pt given hospital briefs to hold PureWick in place. No needs expressed at this time.

## 2019-09-03 NOTE — Progress Notes (Cosign Needed)
Pt sitting in bed watching tv. No needs expressed at this time.

## 2019-09-03 NOTE — Progress Notes (Cosign Needed)
Pt given 10am medication at this time per primary RN request. Pt had no difficulty swallowing medication, no pocketing noted. Multivitamin crushed and given with applesauce and pt took with no difficulties.

## 2019-09-03 NOTE — Evaluation (Signed)
Physical Therapy Evaluation Patient Details Name: Jenna Carrillo MRN: [id number] DOB: 1930/11/16 Today's Date: 09/03/2019   History of Present Illness  86 y.o. female with medical history significant of A.Fib on eliquis, HTN, hypothyroidism, on chronic steroids, PMR. presented to ED with AMS, encephalopathy d/t UTI  Clinical Impression  Pt admitted with above diagnosis.  Pt oriented at time of PT eval, no family present. Pt reports independence at her baseline, amb with rollator. As long as pt continues to progress and clears cognitively , she likely will not need PT f/u post acute. Will follow in acute setting.   Pt currently with functional limitations due to the deficits listed below (see PT Problem List). Pt will benefit from skilled PT to increase their independence and safety with mobility to allow discharge to the venue listed below.       Follow Up Recommendations No PT follow up    Equipment Recommendations  None recommended by PT    Recommendations for Other Services       Precautions / Restrictions Precautions Precautions: Fall Restrictions Weight Bearing Restrictions: No      Mobility  Bed Mobility Overal bed mobility: Needs Assistance Bed Mobility: Supine to Sit;Sit to Supine     Supine to sit: Supervision Sit to supine: Supervision   General bed mobility comments: for safety  Transfers Overall transfer level: Needs assistance Equipment used: Rolling walker (2 wheeled) Transfers: Sit to/from Stand Sit to Stand: Min guard         General transfer comment: from bed and toilet, min/guard for safety  Ambulation/Gait Ambulation/Gait assistance: Min guard;Min assist Gait Distance (Feet): 60 Feet Assistive device: Rolling walker (2 wheeled) Gait Pattern/deviations: Step-through pattern;Decreased stride length Gait velocity: decr   General Gait Details: cues for RW position (am bwith rollator at home). min assist for balance when amb on fall  mat  Stairs            Wheelchair Mobility    Modified Rankin (Stroke Patients Only)       Balance Overall balance assessment: Needs assistance;History of Falls(hx of falls per previous therapy notes) Sitting-balance support: No upper extremity supported;Feet supported Sitting balance-Leahy Scale: Good     Standing balance support: No upper extremity supported;During functional activity Standing balance-Leahy Scale: Fair Standing balance comment: fair+, able to wt shift minimally out of BOS without UE support             High level balance activites: Side stepping               Pertinent Vitals/Pain Pain Assessment: No/denies pain    Home Living Family/patient expects to be discharged to:: Private residence   Available Help at Discharge: Family Type of Home: House       Home Layout: One level Home Equipment: Environmental consultant - 4 wheels      Prior Function Level of Independence: Independent with assistive device(s)         Comments: amb with rollator     Hand Dominance        Extremity/Trunk Assessment   Upper Extremity Assessment Upper Extremity Assessment: Overall WFL for tasks assessed    Lower Extremity Assessment Lower Extremity Assessment: Overall WFL for tasks assessed       Communication   Communication: No difficulties  Cognition Arousal/Alertness: Awake/alert Behavior During Therapy: WFL for tasks assessed/performed Overall Cognitive Status: Within Functional Limits for tasks assessed  General Comments: pt answered questions appropriately, oriented x3 at time of PT eval      General Comments      Exercises     Assessment/Plan    PT Assessment Patient needs continued PT services  PT Problem List Decreased activity tolerance;Decreased balance;Decreased mobility       PT Treatment Interventions DME instruction;Gait training;Functional mobility training;Therapeutic  activities;Patient/family education;Balance training;Therapeutic exercise    PT Goals (Current goals can be found in the Care Plan section)  Acute Rehab PT Goals PT Goal Formulation: With patient Time For Goal Achievement: 09/10/19 Potential to Achieve Goals: Good    Frequency Min 3X/week   Barriers to discharge        Co-evaluation               AM-PAC PT "6 Clicks" Mobility  Outcome Measure Help needed turning from your back to your side while in a flat bed without using bedrails?: None Help needed moving from lying on your back to sitting on the side of a flat bed without using bedrails?: None Help needed moving to and from a bed to a chair (including a wheelchair)?: A Little Help needed standing up from a chair using your arms (e.g., wheelchair or bedside chair)?: A Little Help needed to walk in hospital room?: A Little Help needed climbing 3-5 steps with a railing? : A Little 6 Click Score: 20    End of Session Equipment Utilized During Treatment: Gait belt Activity Tolerance: Patient tolerated treatment well Patient left: with call bell/phone within reach;in bed;with bed alarm set   PT Visit Diagnosis: Difficulty in walking, not elsewhere classified (R26.2)    Time: 3220-2542 PT Time Calculation (min) (ACUTE ONLY): 24 min   Charges:   PT Evaluation $PT Eval Low Complexity: 1 Low PT Treatments $Gait Training: 8-22 mins        Delice Bison, PT   Acute Rehab Dept [facility]): [phone]   09/03/2019   [facility] 09/03/2019, 3:06 PM

## 2019-09-03 NOTE — Progress Notes (Signed)
Pt given flutter, use of device explained and observed.  Pt instructed on the importance of using the flutter, coughing, and provide a sputum sample to RN when able.  RN notified.

## 2019-09-03 NOTE — Progress Notes (Signed)
Pt resting on bed, bladder scanner used, 37cc recorded, pt stated she feels like she needs to pee, placed on bedpan, able to void 200cc, concentrated, dark amber urine, odor noted. Pt. Denies any burning/itching or pain w/ urination, stated she felt better. Primary nurse notified.

## 2019-09-03 NOTE — Progress Notes (Signed)
[facility]  PROGRESS NOTE  [name] QQV:[id number] DOB: 12/15/1930 DOA: 09/01/2019 PCP: Shon Baton, MD Admit date - 09/01/2019   Admitting Physician Etta Quill, DO  Outpatient Primary MD for the patient is Shon Baton, MD  LOS - 1 Brief Narrative   Jenna Carrillo is a 86 y.o. year old female with medical history significant for polymyalgia rheumatica on prednisone 10 mg, dementia, COPD bronchiectasis no home O2, proximal atrial fibrillation, chronic diastolic CHF, hypothyroidism, who presented on 09/01/2019 with worsening confusion, urinary frequency and dysuria x1 day and chronic cough x1 month and was admitted with diagnosis of acute hypoxic respiratory failure secondary to COPD exacerbation with bronchiectasis/bronchitis complicated by UTI and metabolic encephalopathy   Subjective  Today patient still has a cough, not very productive per daughter at bedside  A & P    Acute hypoxic respiratory failure secondary to COPD with bronchiectasis/bronchitis.  Currently requiring 2 L to maintain normal oxygen saturations.  Presented with worsening shortness of breath and productive cough per daughter.  Blood cultures unremarkable, procalcitonin 1.43 , SARS Covid test negative -Currently on CAP coverage with azithromycin and ceftriaxone -Sputum culture if cough remains productive --continue incruse, PRN duonebs --holding on increasing prednisone to avoid confusion unless respiratory status doesn't improve -Incentive spirometry, flutter valve wean O2 as able -Delsym as needed for cough  Acute UTI secondary to Proteus mirabilis.  Failed outpatient treatment with nitrofurantoin per daughter a few days prior to presentation. Ua here only with moderate hgb and rare bacteria Urine cultures show resistance to nitrofurantoin- currently on IV ceftriaxone, -Plan to de-escalate to oral antibiotics in 24 hours  Acute metabolic encephalopathy, presumed secondary to hypoxia/infection in  patient with baseline dementia  TSH, ammonia unremarkable CT head nonacute.  MRI brain showed chronic ischemic microangiopathy at baseline alert and oriented to self, place per daughter -Delirium precautions -Treat infection as above -Wean O2 as able  Chronic diastolic CHF with history of stress-induced cardiomyopathy in setting of pneumonia.  TTE 04/2019 shows grade 2 diastolic dysfunction.  Previous TTE on 9/20 showed EF of 40-45%.  Catheter in admission showed signs of vascular congestion.  Does not look volume overloaded on my exam -trial oral lasix 20 mg ( home PRN dose) -Continue to monitor volume status   paroxysmal atrial fibrillation, rate controlledAnd in normal sinus rhythm -Continue home Coreg, Eliquis, monitor on telemetry  Hypertension, stable-continue home Coreg   Polymyalgia rheumatica on chronic steroids -Prednisone increased to 10 mg daily on admission given acute illness (home regimen 3 mg daily daily)  Mood disorder, stable-continue home Elavil    Hypothyroidism, stable-continue home Synthroid    Famil y Communication  : Daughter updated at bedside Butch Penny)  Code Status : DNR  Disposition Plan  :  Patient is from home. Anticipated d/c date: 2 to 3 days. Barriers to d/c or necessity for inpatient status:  IV antibiotics for bronchitis/bronchiectasis, close monitoring respiratory status, wean O2 Consults  : None  Procedures  : None  DVT Prophylaxis  : Eliquis  Lab Results  Component Value Date   PLT 267 09/03/2019    Diet :  Diet Order            Diet Heart Room service appropriate? Yes; Fluid consistency: Thin  Diet effective now               Inpatient Medications Scheduled Meds: . amitriptyline  50 mg Oral QHS  . apixaban  2.5 mg Oral BID  . azithromycin  250 mg Oral Daily  . carvedilol  12.5 mg Oral BID  . levothyroxine  75 mcg Oral Q0600  . multivitamin with minerals  1 tablet Oral Daily  . pantoprazole  40 mg Oral Daily  . predniSONE   9 mg Oral Daily  . umeclidinium bromide  1 puff Inhalation Daily   Continuous Infusions: . cefTRIAXone (ROCEPHIN)  IV 1 g (09/03/19 1821)   PRN Meds:.acetaminophen **OR** acetaminophen, guaiFENesin-dextromethorphan, ipratropium-albuterol, ondansetron **OR** ondansetron (ZOFRAN) IV  Antibiotics  :   Anti-infectives (From admission, onward)   Start     Dose/Rate Route Frequency Ordered Stop   09/02/19 1800  cefTRIAXone (ROCEPHIN) 1 g in sodium chloride 0.9 % 100 mL IVPB     1 g 200 mL/hr over 30 Minutes Intravenous Every 24 hours 09/02/19 1119     09/02/19 1600  azithromycin (ZITHROMAX) tablet 250 mg     250 mg Oral Daily 09/02/19 1504 09/07/19 0959   09/01/19 2230  nitrofurantoin (macrocrystal-monohydrate) (MACROBID) capsule 100 mg  Status:  Discontinued     100 mg Oral 2 times daily 09/01/19 2219 09/01/19 2236   09/01/19 1930  cefTRIAXone (ROCEPHIN) 2 g in sodium chloride 0.9 % 100 mL IVPB     2 g 200 mL/hr over 30 Minutes Intravenous  Once 09/01/19 1924 09/01/19 2029       Objective   Vitals:   09/03/19 0735 09/03/19 0828 09/03/19 1300 09/03/19 2014  BP: (!) 121/59  (!) 108/49 131/71  Pulse: 73  76 78  Resp: (!) 21   19  Temp: 98.3 F (36.8 C)  98.7 F (37.1 C) 98.1 F (36.7 C)  TempSrc: Oral  Oral Oral  SpO2: 100% 99% 100% 100%    SpO2: 100 % O2 Flow Rate (L/min): 3 L/min  Wt Readings from Last 3 Encounters:  07/12/19 50.3 kg  04/07/19 49.8 kg  01/24/19 51.4 kg     Intake/Output Summary (Last 24 hours) at 09/03/2019 2051 Last data filed at 09/03/2019 1549 Gross per 24 hour  Intake --  Output 950 ml  Net -950 ml    Physical Exam:     Awake Alert, Oriented X self, place, not to time/context, Normal affect No new F.N deficits,  [place].AT, Normal respiratory effort on 3 L, no wheezing, no rhonchi, no conversational dyspnea RRR,No Gallops,Rubs or new Murmurs,  +ve B.Sounds, Abd Soft, No tenderness, No rebound, guarding or rigidity. No Cyanosis, No new Rash  or bruise     I have personally reviewed the following:   Data Reviewed:  CBC Recent Labs  Lab 09/01/19 1859 09/02/19 0500 09/03/19 0606  WBC 21.8* 22.3* 12.3*  HGB 12.2 12.1 11.1*  HCT 38.8 38.6 34.9*  PLT 324 311 267  MCV 97.7 98.0 96.4  MCH 30.7 30.7 30.7  MCHC 31.4 31.3 31.8  RDW 13.1 13.3 13.2    Chemistries  Recent Labs  Lab 09/01/19 1859 09/02/19 0500 09/03/19 0606  NA 137 136 137  K 4.6 4.0 3.6  CL 98 97* 100  CO2 32 30 31  GLUCOSE 122* 107* 87  BUN 23 17 26*  CREATININE 1.10* 1.01* 1.03*  CALCIUM 8.9 8.9 8.8*  MG  --   --  2.3  AST 42*  --  35  ALT 26  --  27  ALKPHOS 115  --  89  BILITOT 0.7  --  0.4   ------------------------------------------------------------------------------------------------------------------ No results for input(s): CHOL, HDL, LDLCALC, TRIG, CHOLHDL, LDLDIRECT in the last 72 hours.  No  results found for: HGBA1C ------------------------------------------------------------------------------------------------------------------ Recent Labs    09/01/19 2248  TSH 0.996   ------------------------------------------------------------------------------------------------------------------ No results for input(s): VITAMINB12, FOLATE, FERRITIN, TIBC, IRON, RETICCTPCT in the last 72 hours.  Coagulation profile Recent Labs  Lab 09/01/19 1859  INR 1.4*    No results for input(s): DDIMER in the last 72 hours.  Cardiac Enzymes No results for input(s): CKMB, TROPONINI, MYOGLOBIN in the last 168 hours.  Invalid input(s): CK ------------------------------------------------------------------------------------------------------------------    Component Value Date/Time   BNP 1,930.6 (H) 01/29/2019 0522    Micro Results Recent Results (from the past 240 hour(s))  SARS CORONAVIRUS 2 (TAT 6-24 HRS) Nasopharyngeal Nasopharyngeal Swab     Status: None   Collection Time: 09/01/19  8:42 PM   Specimen: Nasopharyngeal Swab  Result Value  Ref Range Status   SARS Coronavirus 2 NEGATIVE NEGATIVE Final    Comment: (NOTE) SARS-CoV-2 target nucleic acids are NOT DETECTED. The SARS-CoV-2 RNA is generally detectable in upper and lower respiratory specimens during the acute phase of infection. Negative results do not preclude SARS-CoV-2 infection, do not rule out co-infections with other pathogens, and should not be used as the sole basis for treatment or other patient management decisions. Negative results must be combined with clinical observations, patient history, and epidemiological information. The expected result is Negative. Fact Sheet for Patients: HairSlick.no Fact Sheet for Healthcare Providers: quierodirigir.com This test is not yet approved or cleared by the [place] FDA and  has been authorized for detection and/or diagnosis of SARS-CoV-2 by FDA under an Emergency Use Authorization (EUA). This EUA will remain  in effect (meaning this test can be used) for the duration of the COVID-19 declaration under Section 56 4(b)(1) of the Act, 21 U.S.C. section 360bbb-3(b)(1), unless the authorization is terminated or revoked sooner. Performed at [facility], [address], [place], [place] [address]   Culture, Urine     Status: Abnormal   Collection Time: 09/01/19 10:35 PM   Specimen: Urine, Clean Catch  Result Value Ref Range Status   Specimen Description   Final    URINE, CLEAN CATCH Performed at [facility], [address] [place], [place] [address]    Special Requests   Final    NONE Performed at [facility], [address], [place], [place] [address]    Culture 50,000 COLONIES/mL PROTEUS MIRABILIS (A)  Final   Report Status 09/03/2019 FINAL  Final   Organism ID, Bacteria PROTEUS MIRABILIS (A)  Final      Susceptibility   Proteus mirabilis - MIC*    AMPICILLIN <=2 SENSITIVE Sensitive     CEFAZOLIN <=4  SENSITIVE Sensitive     CEFTRIAXONE <=0.25 SENSITIVE Sensitive     CIPROFLOXACIN <=0.25 SENSITIVE Sensitive     GENTAMICIN <=1 SENSITIVE Sensitive     IMIPENEM 1 SENSITIVE Sensitive     NITROFURANTOIN 128 RESISTANT Resistant     TRIMETH/SULFA <=20 SENSITIVE Sensitive     AMPICILLIN/SULBACTAM <=2 SENSITIVE Sensitive     PIP/TAZO <=4 SENSITIVE Sensitive     * 50,000 COLONIES/mL PROTEUS MIRABILIS  Culture, blood (routine x 2)     Status: None (Preliminary result)   Collection Time: 09/01/19 10:48 PM   Specimen: BLOOD  Result Value Ref Range Status   Specimen Description   Final    BLOOD BLOOD LEFT FOREARM Performed at [facility], [address], [place], [place] [address]    Special Requests   Final  BOTTLES DRAWN AEROBIC AND ANAEROBIC Blood Culture adequate volume Performed at [facility], [address], [place], [place] [address]    Culture   Final    NO GROWTH 1 DAY Performed at [facility], [address], [place], [place] [address]    Report Status PENDING  Incomplete  Culture, blood (routine x 2)     Status: None (Preliminary result)   Collection Time: 09/01/19 11:52 PM   Specimen: BLOOD  Result Value Ref Range Status   Specimen Description   Final    BLOOD LEFT ANTECUBITAL Performed at [facility], [address], [place], [place] [address]    Special Requests   Final    BOTTLES DRAWN AEROBIC AND ANAEROBIC Blood Culture adequate volume Performed at [facility], [address] [place], [place] [address]    Culture   Final    NO GROWTH 1 DAY Performed at [facility], [address], [place], [place] [address]    Report Status PENDING  Incomplete    Radiology Reports CT HEAD WO CONTRAST  Result Date: 09/01/2019 CLINICAL DATA:  Confusion and altered mental status EXAM: CT HEAD WITHOUT CONTRAST TECHNIQUE: Contiguous axial images were obtained from the base of the skull  through the vertex without intravenous contrast. COMPARISON:  January 24, 2019 FINDINGS: Brain: Mild diffuse atrophy is stable. There is no intracranial mass, hemorrhage, extra-axial fluid collection, or midline shift. There is patchy small vessel disease in the centra semiovale bilaterally. No acute infarct is demonstrable. Vascular: There is no hyperdense vessel. There is calcification in each carotid siphon region. Skull: The bony calvarium appears intact. Sinuses/Orbits: There is mild mucosal thickening in several ethmoid air cells. Other paranasal sinuses are clear. Orbits appear symmetric bilaterally. Other: Mastoid air cells are clear. IMPRESSION: Mild atrophy with patchy periventricular small vessel disease. No demonstrable acute infarct. No mass or hemorrhage. There are foci of arterial vascular calcification. There is mucosal thickening in several ethmoid air cells. Electronically Signed   By: Bretta Bang III M.D.   On: 09/01/2019 20:07   MR BRAIN WO CONTRAST  Result Date: 09/01/2019 CLINICAL DATA:  Encephalopathy EXAM: MRI HEAD WITHOUT CONTRAST TECHNIQUE: Multiplanar, multiecho pulse sequences of the brain and surrounding structures were obtained without intravenous contrast. COMPARISON:  Head CT 1521 FINDINGS: Brain: No acute infarct, acute hemorrhage or extra-axial collection. Early confluent hyperintense T2-weighted signal of the periventricular and deep white matter, most commonly due to chronic ischemic microangiopathy. There is generalized atrophy without lobar predilection. No chronic microhemorrhage. Normal midline structures. Vascular: Normal flow voids. Skull and upper cervical spine: Normal marrow signal. Sinuses/Orbits: Negative. Other: None. IMPRESSION: 1. No acute intracranial abnormality. 2. Findings of chronic ischemic microangiopathy. Electronically Signed   By: Deatra Robinson M.D.   On: 09/01/2019 22:36   CT ABDOMEN PELVIS W CONTRAST  Result Date: 09/01/2019 CLINICAL DATA:   Urinary frequency, weakness, confusion EXAM: CT ABDOMEN AND PELVIS WITH CONTRAST TECHNIQUE: Multidetector CT imaging of the abdomen and pelvis was performed using the standard protocol following bolus administration of intravenous contrast. CONTRAST:  33mL OMNIPAQUE IOHEXOL 300 MG/ML  SOLN COMPARISON:  01/26/2019 FINDINGS: Lower chest: There is a trace right pleural effusion. Mild vascular congestion. There is right middle lobe bronchiectasis, with right basilar bronchial wall thickening. No acute airspace disease or pneumothorax. Hepatobiliary: No focal liver abnormality is seen. No gallstones, gallbladder wall thickening, or biliary dilatation. Pancreas: Unremarkable. No pancreatic ductal dilatation or surrounding inflammatory changes.  Spleen: Normal in size without focal abnormality. Adrenals/Urinary Tract: Adrenal glands are unremarkable. Kidneys are normal, without renal calculi, focal lesion, or hydronephrosis. Bladder is unremarkable. Stomach/Bowel: Moderate retained stool throughout the colon. No bowel obstruction or ileus. No bowel wall thickening or inflammatory change. Vascular/Lymphatic: Aortic atherosclerosis. No enlarged abdominal or pelvic lymph nodes. Reproductive: Status post hysterectomy. No adnexal masses. Other: Trace free fluid in the pelvis is nonspecific. No free gas. No abdominal wall hernia. Musculoskeletal: No acute or destructive bony lesions. Minimal grade 1 anterolisthesis of L5 on S1 likely due to facet hypertrophy. Reconstructed images demonstrate no additional findings. IMPRESSION: 1. Right middle lobe bronchiectasis with right basilar bronchial wall thickening, which could reflect reactive airway disease or bronchitis. 2. Trace right pleural effusion. 3. Trace pelvic free fluid, nonspecific. No other acute intra-abdominal or intrapelvic process. 4. Moderate fecal retention. 5.  Aortic Atherosclerosis (ICD10-I70.0). Electronically Signed   By: Sharlet Salina M.D.   On: 09/01/2019  23:31   DG Chest Port 1 View  Result Date: 09/01/2019 CLINICAL DATA:  Altered mental status EXAM: PORTABLE CHEST 1 VIEW COMPARISON:  01/26/2019 FINDINGS: Cardiac shadow is within normal limits. Aortic calcifications are seen. Vascular congestion is noted although significantly improved when compared with the prior study. No significant edema is noted. No sizable effusion is noted. No acute bony abnormality is seen. IMPRESSION: Mild vascular congestion although significantly improved when compared with the prior exam. No edema is noted at this time. Electronically Signed   By: Alcide Clever M.D.   On: 09/01/2019 19:34     Time Spent in minutes  30     Laverna Peace M.D on 09/03/2019 at 8:51 PM  To page go to [email or web address] - password [facility]

## 2019-09-04 DIAGNOSIS — J439 Emphysema, unspecified: Secondary | ICD-10-CM | POA: Diagnosis not present

## 2019-09-04 DIAGNOSIS — D72829 Elevated white blood cell count, unspecified: Secondary | ICD-10-CM | POA: Diagnosis not present

## 2019-09-04 DIAGNOSIS — J471 Bronchiectasis with (acute) exacerbation: Secondary | ICD-10-CM | POA: Diagnosis not present

## 2019-09-04 DIAGNOSIS — I4891 Unspecified atrial fibrillation: Secondary | ICD-10-CM | POA: Diagnosis not present

## 2019-09-04 DIAGNOSIS — R05 Cough: Secondary | ICD-10-CM

## 2019-09-04 LAB — CBC
HCT: 37.3 % (ref 36.0–46.0)
Hemoglobin: 11.8 g/dL — ABNORMAL LOW (ref 12.0–15.0)
MCH: 30.7 pg (ref 26.0–34.0)
MCHC: 31.6 g/dL (ref 30.0–36.0)
MCV: 97.1 fL (ref 80.0–100.0)
Platelets: 307 10*3/uL (ref 150–400)
RBC: 3.84 MIL/uL — ABNORMAL LOW (ref 3.87–5.11)
RDW: 13.2 % (ref 11.5–15.5)
WBC: 10.8 10*3/uL — ABNORMAL HIGH (ref 4.0–10.5)
nRBC: 0 % (ref 0.0–0.2)

## 2019-09-04 LAB — BASIC METABOLIC PANEL
Anion gap: 11 (ref 5–15)
BUN: 25 mg/dL — ABNORMAL HIGH (ref 8–23)
CO2: 32 mmol/L (ref 22–32)
Calcium: 9 mg/dL (ref 8.9–10.3)
Chloride: 97 mmol/L — ABNORMAL LOW (ref 98–111)
Creatinine, Ser: 0.79 mg/dL (ref 0.44–1.00)
GFR calc Af Amer: >60 mL/min (ref >60)
GFR calc non Af Amer: >60 mL/min (ref >60)
Glucose, Bld: 101 mg/dL — ABNORMAL HIGH (ref 70–99)
Potassium: 3.3 mmol/L — ABNORMAL LOW (ref 3.5–5.1)
Sodium: 140 mmol/L (ref 135–145)

## 2019-09-04 LAB — MAGNESIUM: Magnesium: 2.2 mg/dL (ref 1.7–2.4)

## 2019-09-04 MED ORDER — CEFDINIR 300 MG PO CAPS: 300.0000 mg | ORAL_CAPSULE | Freq: Two times a day (BID) | ORAL | Status: DC

## 2019-09-04 MED ORDER — POTASSIUM CHLORIDE 20 MEQ PO PACK
40.0000 meq | PACK | Freq: Once | ORAL | Status: AC
  Administered 2019-09-04: 40 meq via ORAL
  Filled 2019-09-04: qty 2

## 2019-09-04 MED ORDER — CEFDINIR 300 MG PO CAPS
300.0000 mg | ORAL_CAPSULE | Freq: Two times a day (BID) | ORAL | Status: DC
  Administered 2019-09-04 – 2019-09-05 (×2): 300 mg via ORAL
  Filled 2019-09-04 (×3): qty 1

## 2019-09-04 NOTE — Progress Notes (Signed)
Physical Therapy Treatment Patient Details Name: Jenna Carrillo MRN: [id number] DOB: 25-Dec-1930 Today's Date: 09/04/2019    History of Present Illness 86 y.o. female with medical history significant of A.Fib on eliquis, HTN, hypothyroidism, on chronic steroids, PMR. presented to ED with AMS.admitted iwth acute hypoxic respiratory failure secondary to COPD exacerbation with bronchiectasis/bronchitis complicated by UTI and metabolic encephalopathy    PT Comments    Pt progressing well. incr amb distance/tolerance to activity today. Pt is pleasant and cooperative   Follow Up Recommendations  No PT follow up     Equipment Recommendations  None recommended by PT    Recommendations for Other Services       Precautions / Restrictions Precautions Precautions: Fall Restrictions Weight Bearing Restrictions: No    Mobility  Bed Mobility               General bed mobility comments: in chair   Transfers Overall transfer level: Needs assistance Equipment used: Rolling walker (2 wheeled) Transfers: Sit to/from Stand Sit to Stand: Supervision         General transfer comment: for safety, incr time, pt controls descent well  Ambulation/Gait Ambulation/Gait assistance: Supervision;Min guard Gait Distance (Feet): 85 Feet Assistive device: Rolling walker (2 wheeled) Gait Pattern/deviations: Step-through pattern;Decreased stride length Gait velocity: decr   General Gait Details: cues for RW position (amb with rollator at home). min-guard  assist for balance when amb on fall mat   Stairs             Wheelchair Mobility    Modified Rankin (Stroke Patients Only)       Balance   Sitting-balance support: No upper extremity supported;Feet supported Sitting balance-Leahy Scale: Good     Standing balance support: No upper extremity supported;During functional activity   Standing balance comment: fair+, able to wt shift minimally out of BOS without UE support.  able to  amb short distance with unilateral UE support.  able to reach and straighten  pillow/chair pad with min/guard for safety                             Cognition Arousal/Alertness: Awake/alert Behavior During Therapy: WFL for tasks assessed/performed Overall Cognitive Status: Within Functional Limits for tasks assessed                                 General Comments: appropriate during PT eval, oriented to "cone", self, time       Exercises      General Comments        Pertinent Vitals/Pain Pain Assessment: No/denies pain    Home Living                      Prior Function            PT Goals (current goals can now be found in the care plan section) Acute Rehab PT Goals Patient Stated Goal: to go home  PT Goal Formulation: With patient Time For Goal Achievement: 09/10/19 Potential to Achieve Goals: Good Progress towards PT goals: Progressing toward goals    Frequency    Min 3X/week      PT Plan Current plan remains appropriate    Co-evaluation              AM-PAC PT "6 Clicks" Mobility   Outcome Measure  Help needed turning from your  back to your side while in a flat bed without using bedrails?: None Help needed moving from lying on your back to sitting on the side of a flat bed without using bedrails?: None Help needed moving to and from a bed to a chair (including a wheelchair)?: A Little Help needed standing up from a chair using your arms (e.g., wheelchair or bedside chair)?: A Little Help needed to walk in hospital room?: A Little Help needed climbing 3-5 steps with a railing? : A Little 6 Click Score: 20    End of Session Equipment Utilized During Treatment: Gait belt Activity Tolerance: Patient tolerated treatment well Patient left: with call bell/phone within reach;in chair;with chair alarm set   PT Visit Diagnosis: Difficulty in walking, not elsewhere classified (R26.2)     Time: 6468-0321 PT  Time Calculation (min) (ACUTE ONLY): 20 min  Charges:  $Gait Training: 8-22 mins                     Baxter Flattery, PT   Acute Rehab Dept [facility]): [phone]   09/04/2019    [facility] 09/04/2019, 11:10 AM

## 2019-09-04 NOTE — Progress Notes (Addendum)
[facility]  PROGRESS NOTE  Jenna Carrillo YQM:[id number] DOB: 04-21-1931 DOA: 09/01/2019 PCP: Shon Baton, MD Admit date - 09/01/2019   Admitting Physician Etta Quill, DO  Outpatient Primary MD for the patient is Shon Baton, MD  LOS - 2 Brief Narrative   Jenna Carrillo is a 86 y.o. year old female with medical history significant for polymyalgia rheumatica on prednisone 10 mg, dementia, COPD bronchiectasis no home O2, proximal atrial fibrillation, chronic diastolic CHF, hypothyroidism, who presented on 09/01/2019 with worsening confusion, urinary frequency and dysuria x1 day and chronic cough x1 month and was admitted with diagnosis of acute hypoxic respiratory failure secondary to COPD exacerbation with bronchiectasis/bronchitis complicated by UTI and metabolic encephalopathy   Subjective  Today patient still has a cough, not very productive, reports her breathing is better.  Denies any chest pain.  No longer requiring oxygen  A & P   Acute hypoxic respiratory failure secondary to COPD with bronchiectasis/bronchitis, improving.  Currently requiring 2 L to maintain normal oxygen saturations.  Presented with worsening shortness of breath and productive cough   Blood cultures unremarkable, procalcitonin 1.43 , SARS Covid test negative, no overt sign of pna on cxr but clinically responding to IV antibiotics and elevated procalcitonin seems consistent with possible infection. -Currently on CAP coverage with azithromycin and ceftriaxone, given improvement in respiratory status will switch ceftriaxone to cefdinir -Sputum culture if cough remains productive --continue incruse, PRN duonebs --holding on increasing prednisone to avoid confusion unless respiratory status doesn't improve -Incentive spirometry, flutter valve wean O2 as able -Delsym as needed for cough  Acute UTI secondary to Proteus mirabilis.  Failed outpatient treatment with nitrofurantoin per daughter a few days prior  to presentation. Ua here only with moderate hgb and rare bacteria Urine cultures show resistance to nitrofurantoin -currently on IV ceftriaxone, -Plan to de-escalate to cefdinir on 5/28  Acute metabolic encephalopathy, presumed secondary to hypoxia/infection in patient with baseline dementia, resolved.  TSH, ammonia unremarkable CT head nonacute.  MRI brain showed chronic ischemic microangiopathy at baseline alert and oriented to self, place per daughter -Delirium precautions -Treat infection as above Chronic diastolic CHF with history of stress-induced cardiomyopathy in setting of pneumonia.  TTE 04/2019 shows grade 2 diastolic dysfunction.  Previous TTE on 9/20 showed EF of 40-45%.  Catheter in admission showed signs of vascular congestion.  Does not look volume overloaded on my exam -trial oral lasix 20 mg ( home PRN dose) -Continue to monitor volume status   Paroxysmal atrial fibrillation, rate controlledAnd in normal sinus rhythm -Continue home Coreg, Eliquis, monitor on telemetry  Hypertension, stable-continue home Coreg  Polymyalgia rheumatica on chronic steroids -Prednisone increased to 9 mg daily on admission given acute illness (home regimen 3 mg daily daily)  Mood disorder, stable-continue home Elavil  Hypothyroidism, stable-continue home Synthroid     Famil y Communication  : Daughter , Jenna Carrillo updated on phone [phone], on 09/04/2019.  Code Status : DNR  Disposition Plan  :  Patient is from home. Anticipated d/c date: 24-48 hours. Barriers to d/c or necessity for inpatient status:  De-escalate to cefdinir, close monitoring respiratory status, ambulatory O2 test, if remains clinically stable anticipate discharge in 24 hours Consults  : None  Procedures  : None  DVT Prophylaxis  : Eliquis  Lab Results  Component Value Date   PLT 307 09/04/2019    Diet :  Diet Order            Diet Heart Room service  appropriate? Yes; Fluid consistency: Thin  Diet  effective now               Inpatient Medications Scheduled Meds: . amitriptyline  50 mg Oral QHS  . apixaban  2.5 mg Oral BID  . azithromycin  250 mg Oral Daily  . carvedilol  12.5 mg Oral BID  . furosemide  20 mg Oral Daily  . levothyroxine  75 mcg Oral Q0600  . multivitamin with minerals  1 tablet Oral Daily  . pantoprazole  40 mg Oral Daily  . predniSONE  9 mg Oral Daily  . umeclidinium bromide  1 puff Inhalation Daily   Continuous Infusions: . cefTRIAXone (ROCEPHIN)  IV 1 g (09/03/19 1821)   PRN Meds:.acetaminophen **OR** acetaminophen, guaiFENesin-dextromethorphan, ipratropium-albuterol, ondansetron **OR** ondansetron (ZOFRAN) IV  Antibiotics  :   Anti-infectives (From admission, onward)   Start     Dose/Rate Route Frequency Ordered Stop   09/02/19 1800  cefTRIAXone (ROCEPHIN) 1 g in sodium chloride 0.9 % 100 mL IVPB     1 g 200 mL/hr over 30 Minutes Intravenous Every 24 hours 09/02/19 1119     09/02/19 1600  azithromycin (ZITHROMAX) tablet 250 mg     250 mg Oral Daily 09/02/19 1504 09/07/19 0959   09/01/19 2230  nitrofurantoin (macrocrystal-monohydrate) (MACROBID) capsule 100 mg  Status:  Discontinued     100 mg Oral 2 times daily 09/01/19 2219 09/01/19 2236   09/01/19 1930  cefTRIAXone (ROCEPHIN) 2 g in sodium chloride 0.9 % 100 mL IVPB     2 g 200 mL/hr over 30 Minutes Intravenous  Once 09/01/19 1924 09/01/19 2029       Objective   Vitals:   09/04/19 0910 09/04/19 0912 09/04/19 1549 09/04/19 1550  BP:   (!) 141/73   Pulse:   84   Resp:   18   Temp:   98.3 F (36.8 C)   TempSrc:      SpO2: 91% 91% 100%   Weight:    50.3 kg    SpO2: 100 % O2 Flow Rate (L/min): 3 L/min  Wt Readings from Last 3 Encounters:  09/04/19 50.3 kg  07/12/19 50.3 kg  04/07/19 49.8 kg     Intake/Output Summary (Last 24 hours) at 09/04/2019 1722 Last data filed at 09/04/2019 0906 Gross per 24 hour  Intake 45 ml  Output 2150 ml  Net -2105 ml    Physical  Exam:     Awake Alert, Oriented X self, place, time, Normal affect Sitting up in bedside chair, no distress No new F.N deficits,  [place].AT, Normal respiratory effort on room air, no wheezing, no rhonchi, no conversational dyspnea RRR,No Gallops,Rubs or new Murmurs,  +ve B.Sounds, Abd Soft, No tenderness, No rebound, guarding or rigidity. No Cyanosis, No new Rash or bruise     I have personally reviewed the following:   Data Reviewed:  CBC Recent Labs  Lab 09/01/19 1859 09/02/19 0500 09/03/19 0606 09/04/19 0635  WBC 21.8* 22.3* 12.3* 10.8*  HGB 12.2 12.1 11.1* 11.8*  HCT 38.8 38.6 34.9* 37.3  PLT 324 311 267 307  MCV 97.7 98.0 96.4 97.1  MCH 30.7 30.7 30.7 30.7  MCHC 31.4 31.3 31.8 31.6  RDW 13.1 13.3 13.2 13.2    Chemistries  Recent Labs  Lab 09/01/19 1859 09/02/19 0500 09/03/19 0606 09/04/19 0635 09/04/19 0810  NA 137 136 137 140  --   K 4.6 4.0 3.6 3.3*  --   CL 98 97* 100 97*  --  CO2 32 30 31 32  --   GLUCOSE 122* 107* 87 101*  --   BUN 23 17 26* 25*  --   CREATININE 1.10* 1.01* 1.03* 0.79  --   CALCIUM 8.9 8.9 8.8* 9.0  --   MG  --   --  2.3  --  2.2  AST 42*  --  35  --   --   ALT 26  --  27  --   --   ALKPHOS 115  --  89  --   --   BILITOT 0.7  --  0.4  --   --    ------------------------------------------------------------------------------------------------------------------ No results for input(s): CHOL, HDL, LDLCALC, TRIG, CHOLHDL, LDLDIRECT in the last 72 hours.  No results found for: HGBA1C ------------------------------------------------------------------------------------------------------------------ Recent Labs    09/01/19 2248  TSH 0.996   ------------------------------------------------------------------------------------------------------------------ No results for input(s): VITAMINB12, FOLATE, FERRITIN, TIBC, IRON, RETICCTPCT in the last 72 hours.  Coagulation profile Recent Labs  Lab 09/01/19 1859  INR 1.4*    No  results for input(s): DDIMER in the last 72 hours.  Cardiac Enzymes No results for input(s): CKMB, TROPONINI, MYOGLOBIN in the last 168 hours.  Invalid input(s): CK ------------------------------------------------------------------------------------------------------------------    Component Value Date/Time   BNP 1,930.6 (H) 01/29/2019 0522    Micro Results Recent Results (from the past 240 hour(s))  SARS CORONAVIRUS 2 (TAT 6-24 HRS) Nasopharyngeal Nasopharyngeal Swab     Status: None   Collection Time: 09/01/19  8:42 PM   Specimen: Nasopharyngeal Swab  Result Value Ref Range Status   SARS Coronavirus 2 NEGATIVE NEGATIVE Final    Comment: (NOTE) SARS-CoV-2 target nucleic acids are NOT DETECTED. The SARS-CoV-2 RNA is generally detectable in upper and lower respiratory specimens during the acute phase of infection. Negative results do not preclude SARS-CoV-2 infection, do not rule out co-infections with other pathogens, and should not be used as the sole basis for treatment or other patient management decisions. Negative results must be combined with clinical observations, patient history, and epidemiological information. The expected result is Negative. Fact Sheet for Patients: HairSlick.no Fact Sheet for Healthcare Providers: quierodirigir.com This test is not yet approved or cleared by the [place] FDA and  has been authorized for detection and/or diagnosis of SARS-CoV-2 by FDA under an Emergency Use Authorization (EUA). This EUA will remain  in effect (meaning this test can be used) for the duration of the COVID-19 declaration under Section 56 4(b)(1) of the Act, 21 U.S.C. section 360bbb-3(b)(1), unless the authorization is terminated or revoked sooner. Performed at [facility], [address] [place], [place] [address]   Culture, Urine     Status: Abnormal   Collection Time: 09/01/19 10:35 PM   Specimen:  Urine, Clean Catch  Result Value Ref Range Status   Specimen Description   Final    URINE, CLEAN CATCH Performed at [facility], [address] [place], [place] [address]    Special Requests   Final    NONE Performed at [facility], [address] [place], [place] [address]    Culture 50,000 COLONIES/mL PROTEUS MIRABILIS (A)  Final   Report Status 09/03/2019 FINAL  Final   Organism ID, Bacteria PROTEUS MIRABILIS (A)  Final      Susceptibility   Proteus mirabilis - MIC*    AMPICILLIN <=2 SENSITIVE Sensitive     CEFAZOLIN <=4 SENSITIVE Sensitive     CEFTRIAXONE <=0.25 SENSITIVE Sensitive     CIPROFLOXACIN <=  0.25 SENSITIVE Sensitive     GENTAMICIN <=1 SENSITIVE Sensitive     IMIPENEM 1 SENSITIVE Sensitive     NITROFURANTOIN 128 RESISTANT Resistant     TRIMETH/SULFA <=20 SENSITIVE Sensitive     AMPICILLIN/SULBACTAM <=2 SENSITIVE Sensitive     PIP/TAZO <=4 SENSITIVE Sensitive     * 50,000 COLONIES/mL PROTEUS MIRABILIS  Culture, blood (routine x 2)     Status: None (Preliminary result)   Collection Time: 09/01/19 10:48 PM   Specimen: BLOOD  Result Value Ref Range Status   Specimen Description   Final    BLOOD BLOOD LEFT FOREARM Performed at [facility], [address], [place], [place] [address]    Special Requests   Final    BOTTLES DRAWN AEROBIC AND ANAEROBIC Blood Culture adequate volume Performed at [facility], [address] [place], [place] [address]    Culture   Final    NO GROWTH 2 DAYS Performed at [facility], [address], [place], [place] [address]    Report Status PENDING  Incomplete  Culture, blood (routine x 2)     Status: None (Preliminary result)   Collection Time: 09/01/19 11:52 PM   Specimen: BLOOD  Result Value Ref Range Status   Specimen Description   Final    BLOOD LEFT ANTECUBITAL Performed at [facility], [address], [place],  [place] [address]    Special Requests   Final    BOTTLES DRAWN AEROBIC AND ANAEROBIC Blood Culture adequate volume Performed at [facility], [address], [place], [place] [address]    Culture   Final    NO GROWTH 2 DAYS Performed at [facility], [address] [place], [place] [address]    Report Status PENDING  Incomplete    Radiology Reports CT HEAD WO CONTRAST  Result Date: 09/01/2019 CLINICAL DATA:  Confusion and altered mental status EXAM: CT HEAD WITHOUT CONTRAST TECHNIQUE: Contiguous axial images were obtained from the base of the skull through the vertex without intravenous contrast. COMPARISON:  January 24, 2019 FINDINGS: Brain: Mild diffuse atrophy is stable. There is no intracranial mass, hemorrhage, extra-axial fluid collection, or midline shift. There is patchy small vessel disease in the centra semiovale bilaterally. No acute infarct is demonstrable. Vascular: There is no hyperdense vessel. There is calcification in each carotid siphon region. Skull: The bony calvarium appears intact. Sinuses/Orbits: There is mild mucosal thickening in several ethmoid air cells. Other paranasal sinuses are clear. Orbits appear symmetric bilaterally. Other: Mastoid air cells are clear. IMPRESSION: Mild atrophy with patchy periventricular small vessel disease. No demonstrable acute infarct. No mass or hemorrhage. There are foci of arterial vascular calcification. There is mucosal thickening in several ethmoid air cells. Electronically Signed   By: Bretta Bang III M.D.   On: 09/01/2019 20:07   MR BRAIN WO CONTRAST  Result Date: 09/01/2019 CLINICAL DATA:  Encephalopathy EXAM: MRI HEAD WITHOUT CONTRAST TECHNIQUE: Multiplanar, multiecho pulse sequences of the brain and surrounding structures were obtained without intravenous contrast. COMPARISON:  Head CT 1521 FINDINGS: Brain: No acute infarct, acute hemorrhage or extra-axial collection. Early confluent hyperintense  T2-weighted signal of the periventricular and deep white matter, most commonly due to chronic ischemic microangiopathy. There is generalized atrophy without lobar predilection. No chronic microhemorrhage. Normal midline structures. Vascular: Normal flow voids. Skull and upper cervical spine: Normal marrow signal. Sinuses/Orbits: Negative. Other: None. IMPRESSION: 1. No acute intracranial abnormality. 2. Findings of chronic ischemic  microangiopathy. Electronically Signed   By: Deatra Robinson M.D.   On: 09/01/2019 22:36   CT ABDOMEN PELVIS W CONTRAST  Result Date: 09/01/2019 CLINICAL DATA:  Urinary frequency, weakness, confusion EXAM: CT ABDOMEN AND PELVIS WITH CONTRAST TECHNIQUE: Multidetector CT imaging of the abdomen and pelvis was performed using the standard protocol following bolus administration of intravenous contrast. CONTRAST:  53mL OMNIPAQUE IOHEXOL 300 MG/ML  SOLN COMPARISON:  01/26/2019 FINDINGS: Lower chest: There is a trace right pleural effusion. Mild vascular congestion. There is right middle lobe bronchiectasis, with right basilar bronchial wall thickening. No acute airspace disease or pneumothorax. Hepatobiliary: No focal liver abnormality is seen. No gallstones, gallbladder wall thickening, or biliary dilatation. Pancreas: Unremarkable. No pancreatic ductal dilatation or surrounding inflammatory changes. Spleen: Normal in size without focal abnormality. Adrenals/Urinary Tract: Adrenal glands are unremarkable. Kidneys are normal, without renal calculi, focal lesion, or hydronephrosis. Bladder is unremarkable. Stomach/Bowel: Moderate retained stool throughout the colon. No bowel obstruction or ileus. No bowel wall thickening or inflammatory change. Vascular/Lymphatic: Aortic atherosclerosis. No enlarged abdominal or pelvic lymph nodes. Reproductive: Status post hysterectomy. No adnexal masses. Other: Trace free fluid in the pelvis is nonspecific. No free gas. No abdominal wall hernia.  Musculoskeletal: No acute or destructive bony lesions. Minimal grade 1 anterolisthesis of L5 on S1 likely due to facet hypertrophy. Reconstructed images demonstrate no additional findings. IMPRESSION: 1. Right middle lobe bronchiectasis with right basilar bronchial wall thickening, which could reflect reactive airway disease or bronchitis. 2. Trace right pleural effusion. 3. Trace pelvic free fluid, nonspecific. No other acute intra-abdominal or intrapelvic process. 4. Moderate fecal retention. 5.  Aortic Atherosclerosis (ICD10-I70.0). Electronically Signed   By: Sharlet Salina M.D.   On: 09/01/2019 23:31   DG Chest Port 1 View  Result Date: 09/01/2019 CLINICAL DATA:  Altered mental status EXAM: PORTABLE CHEST 1 VIEW COMPARISON:  01/26/2019 FINDINGS: Cardiac shadow is within normal limits. Aortic calcifications are seen. Vascular congestion is noted although significantly improved when compared with the prior study. No significant edema is noted. No sizable effusion is noted. No acute bony abnormality is seen. IMPRESSION: Mild vascular congestion although significantly improved when compared with the prior exam. No edema is noted at this time. Electronically Signed   By: Alcide Clever M.D.   On: 09/01/2019 19:34     Time Spent in minutes  30     Laverna Peace M.D on 09/04/2019 at 5:22 PM  To page go to [email or web address] - password [email or web address]

## 2019-09-05 DIAGNOSIS — A498 Other bacterial infections of unspecified site: Secondary | ICD-10-CM

## 2019-09-05 LAB — CBC
HCT: 39.3 % (ref 36.0–46.0)
Hemoglobin: 12.4 g/dL (ref 12.0–15.0)
MCH: 30.4 pg (ref 26.0–34.0)
MCHC: 31.6 g/dL (ref 30.0–36.0)
MCV: 96.3 fL (ref 80.0–100.0)
Platelets: 316 10*3/uL (ref 150–400)
RBC: 4.08 MIL/uL (ref 3.87–5.11)
RDW: 13 % (ref 11.5–15.5)
WBC: 8.4 10*3/uL (ref 4.0–10.5)
nRBC: 0 % (ref 0.0–0.2)

## 2019-09-05 LAB — BASIC METABOLIC PANEL
Anion gap: 9 (ref 5–15)
BUN: 26 mg/dL — ABNORMAL HIGH (ref 8–23)
CO2: 32 mmol/L (ref 22–32)
Calcium: 9.2 mg/dL (ref 8.9–10.3)
Chloride: 98 mmol/L (ref 98–111)
Creatinine, Ser: 1.01 mg/dL — ABNORMAL HIGH (ref 0.44–1.00)
GFR calc Af Amer: 58 mL/min — ABNORMAL LOW (ref >60)
GFR calc non Af Amer: 50 mL/min — ABNORMAL LOW (ref >60)
Glucose, Bld: 91 mg/dL (ref 70–99)
Potassium: 4 mmol/L (ref 3.5–5.1)
Sodium: 139 mmol/L (ref 135–145)

## 2019-09-05 MED ORDER — CEFDINIR 300 MG PO CAPS: 300.0000 mg | ORAL_CAPSULE | Freq: Two times a day (BID) | ORAL | 0 refills | Status: AC

## 2019-09-05 MED ORDER — AZITHROMYCIN 250 MG PO TABS: 250.0000 mg | ORAL_TABLET | Freq: Once | ORAL | 0 refills | Status: AC

## 2019-09-05 NOTE — Progress Notes (Signed)
O2 Desaturation screen: Pt ambulated hall on room air and tolerated well. O2 sats maintained 94-98% on RA. MD notified.

## 2019-09-05 NOTE — Discharge Summary (Signed)
Jenna Carrillo ZOX:[id number] DOB: August 23, 1930 DOA: 09/01/2019  PCP: Creola Corn, MD  Admit date: 09/01/2019 Discharge date: 09/05/2019  Admitted From: Home Disposition: Home  Recommendations for Outpatient Follow-up:  1. Follow up with PCP in 1-2 weeks 2. Complete course of cefdinir and azithromycin for combined bronchitis/UTI coverage 3.   Home Health: None Equipment/Devices: None Discharge Condition: Stable CODE STATUS: DNR  Brief/Interim Summary: History of present illness:  Jenna Carrillo is a 86 y.o. year old female with medical history significant for polymyalgia rheumatica on prednisone 10 mg, dementia, COPD bronchiectasis no home O2, proximal atrial fibrillation, chronic diastolic CHF, hypothyroidism, who presented on 09/01/2019 with worsening confusion, urinary frequency and dysuria x1 day and chronic cough x1 month and was admitted with diagnosis of acute hypoxic respiratory failure secondary to COPD exacerbation with bronchiectasis/bronchitis complicated by UTI and metabolic encephalopathy Remaining hospital course addressed in problem based format below:   Hospital Course:   Acute hypoxic respiratory failure secondary to COPD with bronchiectasis/bronchitis, improving.    Initially required 2 L of O2 to maintain normal oxygen saturation.  Presented with worsening shortness of breath and productive cough   Blood cultures unremarkable, procalcitonin 1.43 , SARS Covid test negative, no overt sign of pna on cxr but clinically responding to IV antibiotics with ceftriaxone and azithromycin for CAP coverage and elevated procalcitonin seemed consistent with possible infection in addition to COPD flare which improved with scheduled inhalers. -Given improvement in respiratory status and ability to wean off to room air we will continue azithromycin and cefdinir on discharge -Briefly increased home prednisone regimen to 9 mg daily for assistance with COPD/bronchiectasis, can resume 3 mg daily  on discharge (takes it for PMR) -Continue supportive care with incentive spirometer, flutter valve, resume home inhaler regimen -Sputum culture if cough remains productive   Acute UTI secondary to Proteus mirabilis.  Failed outpatient treatment with nitrofurantoin per daughter a few days prior to presentation. Ua here only with moderate hgb and rare bacteria Urine cultures show resistance to nitrofurantoin -While in Hospital received IV ceftriaxone, -On discharge will continue cefdinir to complete course  Acute metabolic encephalopathy, presumed secondary to hypoxia/infection in patient with baseline dementia, resolved.  TSH, ammonia unremarkable CT head nonacute.  MRI brain showed chronic ischemic microangiopathy at baseline alert and oriented to self, place per daughter -Improved with treatment of infection and COPD exacerbation  Chronic diastolic CHF with history of stress-induced cardiomyopathy in setting of pneumonia.  TTE 04/2019 shows grade 2 diastolic dysfunction.  Previous TTE on 9/20 showed EF of 40-45%.  CXR on admission showed signs of vascular congestion.    Did not look volume overloaded during hospitalization. -Continue oral Lasix 20 mg daily -On discharge can resume home as needed dose lasix 20 mg ( home PRN dose)    Paroxysmal atrial fibrillation, rate controlledAnd in normal sinus rhythm -Continue home Coreg, Eliquis,   Hypertension, stable-continue home Coreg  Polymyalgia rheumatica on chronic steroids -Prednisone increased to 9 mg daily on admission given acute illness-on discharge can resume home regimen 3 mg daily  Mood disorder, stable-continue home Elavil  Hypothyroidism, stable-continue home Synthroid   Consultations:  None  Procedures/Studies: none Subjective: Doing well, ready to go home, mild cough Discharge Exam: Vitals:   09/05/19 1311 09/05/19 1405  BP:  (!) 118/57  Pulse: 90 90  Resp:  16  Temp:  (!) 97.3 F (36.3 C)  SpO2: 98% 97%    Vitals:   09/05/19 0950 09/05/19 1233 09/05/19 1311 09/05/19 1405  BP:  (!) 124/58  (!) 118/57  Pulse:  73 90 90  Resp:  16  16  Temp:  98 F (36.7 C)  (!) 97.3 F (36.3 C)  TempSrc:  Oral  Oral  SpO2: 99%  98% 97%  Weight:  50.3 kg    Height:  5' 4.02" (1.626 m)      Awake Alert, Oriented X self, place, time, Normal affect Sitting up in bedside chair, no distress No new F.N deficits,  [place].AT, Normal respiratory effort on room air, no wheezing, no rhonchi, no conversational dyspnea RRR,No Gallops,Rubs or new Murmurs,  +ve B.Sounds, Abd Soft, No tenderness, No rebound, guarding or rigidity. No Cyanosis, No new Rash or bruise     Discharge Diagnoses:  Principal Problem:   Acute metabolic encephalopathy Active Problems:   Essential hypertension   BRONCHIECTASIS   COPD (chronic obstructive pulmonary disease) (HCC)   Polymyalgia rheumatica (HCC)   Cough   Bronchiectasis with (acute) exacerbation (HCC)   Current chronic use of systemic steroids   Atrial fibrillation (HCC)   Leukocytosis   Metabolic encephalopathy    Discharge Instructions  Discharge Instructions    Diet - low sodium heart healthy   Complete by: As directed    Increase activity slowly   Complete by: As directed      Allergies as of 09/05/2019      Reactions   Sulfonamide Derivatives Rash      Medication List    STOP taking these medications   nitrofurantoin (macrocrystal-monohydrate) 100 MG capsule Commonly known as: MACROBID     TAKE these medications   AeroChamber MV inhaler Use as instructed   albuterol 108 (90 Base) MCG/ACT inhaler Commonly known as: VENTOLIN HFA Inhale 1 puff into the lungs every 4 (four) hours as needed for wheezing.   amitriptyline 50 MG tablet Commonly known as: ELAVIL Take 50 mg by mouth at bedtime.   apixaban 2.5 MG Tabs tablet Commonly known as: ELIQUIS Take 1 tablet (2.5 mg total) by mouth 2 (two) times daily.   azithromycin 250 MG tablet Commonly  known as: ZITHROMAX Take 1 tablet (250 mg total) by mouth once for 1 dose. Start taking on: September 06, 2019   benzonatate 200 MG capsule Commonly known as: TESSALON Take 1 capsule (200 mg total) by mouth 3 (three) times daily as needed for cough.   carvedilol 12.5 MG tablet Commonly known as: COREG Take 1 tablet (12.5 mg total) by mouth 2 (two) times daily with a meal.   cefdinir 300 MG capsule Commonly known as: OMNICEF Take 1 capsule (300 mg total) by mouth every 12 (twelve) hours for 6 doses.   DELSYM COUGH/CHEST CONGEST DM PO Take 1 tablet by mouth every 4 (four) hours as needed (cough,congestion).   furosemide 20 MG tablet Commonly known as: Lasix Take 1 tablet (20 mg total) by mouth daily as needed for fluid or edema (Weight gain of more than 3 pounds in 1 day or 5 pounds in 2 days).   ipratropium-albuterol 0.5-2.5 (3) MG/3ML Soln Commonly known as: DUONEB Inhale 3 mLs into the lungs 2 (two) times daily as needed for cough.   levalbuterol 45 MCG/ACT inhaler Commonly known as: XOPENEX HFA Inhale 1 puff into the lungs every 4 (four) hours as needed for wheezing.   levothyroxine 75 MCG tablet Commonly known as: SYNTHROID Take 1 tablet (75 mcg total) by mouth daily at 6 (six) AM.   multivitamin tablet Take 1 tablet by mouth daily.  omeprazole 20 MG capsule Commonly known as: PRILOSEC Take 20 mg by mouth daily as needed (acid reflux).   predniSONE 1 MG tablet Commonly known as: DELTASONE Take 3 mg by mouth daily.   Spiriva HandiHaler 18 MCG inhalation capsule Generic drug: tiotropium PLACE 1 CAPSULE INTO THE INHALER AND INHALE DAILY What changed:   how much to take  how to take this  when to take this  additional instructions       Allergies  Allergen Reactions  . Sulfonamide Derivatives Rash        The results of significant diagnostics from this hospitalization (including imaging, microbiology, ancillary and laboratory) are listed below for  reference.     Microbiology: Recent Results (from the past 240 hour(s))  SARS CORONAVIRUS 2 (TAT 6-24 HRS) Nasopharyngeal Nasopharyngeal Swab     Status: None   Collection Time: 09/01/19  8:42 PM   Specimen: Nasopharyngeal Swab  Result Value Ref Range Status   SARS Coronavirus 2 NEGATIVE NEGATIVE Final    Comment: (NOTE) SARS-CoV-2 target nucleic acids are NOT DETECTED. The SARS-CoV-2 RNA is generally detectable in upper and lower respiratory specimens during the acute phase of infection. Negative results do not preclude SARS-CoV-2 infection, do not rule out co-infections with other pathogens, and should not be used as the sole basis for treatment or other patient management decisions. Negative results must be combined with clinical observations, patient history, and epidemiological information. The expected result is Negative. Fact Sheet for Patients: HairSlick.no Fact Sheet for Healthcare Providers: quierodirigir.com This test is not yet approved or cleared by the [place] FDA and  has been authorized for detection and/or diagnosis of SARS-CoV-2 by FDA under an Emergency Use Authorization (EUA). This EUA will remain  in effect (meaning this test can be used) for the duration of the COVID-19 declaration under Section 56 4(b)(1) of the Act, 21 U.S.C. section 360bbb-3(b)(1), unless the authorization is terminated or revoked sooner. Performed at [facility], [address], [place], [place] [address]   Culture, Urine     Status: Abnormal   Collection Time: 09/01/19 10:35 PM   Specimen: Urine, Clean Catch  Result Value Ref Range Status   Specimen Description   Final    URINE, CLEAN CATCH Performed at [facility], [address] [place], [place] [address]    Special Requests   Final    NONE Performed at [facility], [address] [place], [place] [address]    Culture  50,000 COLONIES/mL PROTEUS MIRABILIS (A)  Final   Report Status 09/03/2019 FINAL  Final   Organism ID, Bacteria PROTEUS MIRABILIS (A)  Final      Susceptibility   Proteus mirabilis - MIC*    AMPICILLIN <=2 SENSITIVE Sensitive     CEFAZOLIN <=4 SENSITIVE Sensitive     CEFTRIAXONE <=0.25 SENSITIVE Sensitive     CIPROFLOXACIN <=0.25 SENSITIVE Sensitive     GENTAMICIN <=1 SENSITIVE Sensitive     IMIPENEM 1 SENSITIVE Sensitive     NITROFURANTOIN 128 RESISTANT Resistant     TRIMETH/SULFA <=20 SENSITIVE Sensitive     AMPICILLIN/SULBACTAM <=2 SENSITIVE Sensitive     PIP/TAZO <=4 SENSITIVE Sensitive     * 50,000 COLONIES/mL PROTEUS MIRABILIS  Culture, blood (routine x 2)     Status: None (Preliminary result)   Collection Time: 09/01/19 10:48 PM   Specimen: BLOOD  Result Value Ref Range Status   Specimen Description   Final    BLOOD BLOOD LEFT  FOREARM Performed at [facility], [address] [place], [place] [address]    Special Requests   Final    BOTTLES DRAWN AEROBIC AND ANAEROBIC Blood Culture adequate volume Performed at [facility], [address] [place], [place] [address]    Culture   Final    NO GROWTH 3 DAYS Performed at [facility], [address], [place], [place] [address]    Report Status PENDING  Incomplete  Culture, blood (routine x 2)     Status: None (Preliminary result)   Collection Time: 09/01/19 11:52 PM   Specimen: BLOOD  Result Value Ref Range Status   Specimen Description   Final    BLOOD LEFT ANTECUBITAL Performed at [facility], [address], [place], [place] [address]    Special Requests   Final    BOTTLES DRAWN AEROBIC AND ANAEROBIC Blood Culture adequate volume Performed at [facility], [address], [place], [place] [address]    Culture   Final    NO GROWTH 3 DAYS Performed at [facility], [address] [place], [place] [address]    Report Status  PENDING  Incomplete     Labs: BNP (last 3 results) Recent Labs    01/29/19 0522  BNP 1,930.6*   Basic Metabolic Panel: Recent Labs  Lab 09/01/19 1859 09/02/19 0500 09/03/19 0606 09/04/19 0635 09/04/19 0810 09/05/19 0548  NA 137 136 137 140  --  139  K 4.6 4.0 3.6 3.3*  --  4.0  CL 98 97* 100 97*  --  98  CO2 32 30 31 32  --  32  GLUCOSE 122* 107* 87 101*  --  91  BUN 23 17 26* 25*  --  26*  CREATININE 1.10* 1.01* 1.03* 0.79  --  1.01*  CALCIUM 8.9 8.9 8.8* 9.0  --  9.2  MG  --   --  2.3  --  2.2  --   PHOS  --   --  3.0  --   --   --    Liver Function Tests: Recent Labs  Lab 09/01/19 1859 09/03/19 0606  AST 42* 35  ALT 26 27  ALKPHOS 115 89  BILITOT 0.7 0.4  PROT 7.6 6.2*  ALBUMIN 3.6 2.8*   No results for input(s): LIPASE, AMYLASE in the last 168 hours. Recent Labs  Lab 09/01/19 2248  AMMONIA 16   CBC: Recent Labs  Lab 09/01/19 1859 09/02/19 0500 09/03/19 0606 09/04/19 0635 09/05/19 0548  WBC 21.8* 22.3* 12.3* 10.8* 8.4  HGB 12.2 12.1 11.1* 11.8* 12.4  HCT 38.8 38.6 34.9* 37.3 39.3  MCV 97.7 98.0 96.4 97.1 96.3  PLT 324 311 267 307 316   Cardiac Enzymes: No results for input(s): CKTOTAL, CKMB, CKMBINDEX, TROPONINI in the last 168 hours. BNP: Invalid input(s): POCBNP CBG: No results for input(s): GLUCAP in the last 168 hours. D-Dimer No results for input(s): DDIMER in the last 72 hours. Hgb A1c No results for input(s): HGBA1C in the last 72 hours. Lipid Profile No results for input(s): CHOL, HDL, LDLCALC, TRIG, CHOLHDL, LDLDIRECT in the last 72 hours. Thyroid function studies No results for input(s): TSH, T4TOTAL, T3FREE, THYROIDAB in the last 72 hours.  Invalid input(s): FREET3 Anemia work up No results for input(s): VITAMINB12, FOLATE, FERRITIN, TIBC, IRON, RETICCTPCT in the last 72 hours. Urinalysis    Component Value Date/Time   COLORURINE YELLOW 09/01/2019 1859   APPEARANCEUR CLEAR  09/01/2019 1859   LABSPEC 1.016 09/01/2019  1859   PHURINE 5.0 09/01/2019 1859   GLUCOSEU NEGATIVE 09/01/2019 1859   HGBUR MODERATE (A) 09/01/2019 1859   BILIRUBINUR NEGATIVE 09/01/2019 1859   KETONESUR NEGATIVE 09/01/2019 1859   PROTEINUR NEGATIVE 09/01/2019 1859   NITRITE NEGATIVE 09/01/2019 1859   LEUKOCYTESUR NEGATIVE 09/01/2019 1859   Sepsis Labs Invalid input(s): PROCALCITONIN,  WBC,  LACTICIDVEN Microbiology Recent Results (from the past 240 hour(s))  SARS CORONAVIRUS 2 (TAT 6-24 HRS) Nasopharyngeal Nasopharyngeal Swab     Status: None   Collection Time: 09/01/19  8:42 PM   Specimen: Nasopharyngeal Swab  Result Value Ref Range Status   SARS Coronavirus 2 NEGATIVE NEGATIVE Final    Comment: (NOTE) SARS-CoV-2 target nucleic acids are NOT DETECTED. The SARS-CoV-2 RNA is generally detectable in upper and lower respiratory specimens during the acute phase of infection. Negative results do not preclude SARS-CoV-2 infection, do not rule out co-infections with other pathogens, and should not be used as the sole basis for treatment or other patient management decisions. Negative results must be combined with clinical observations, patient history, and epidemiological information. The expected result is Negative. Fact Sheet for Patients: HairSlick.no Fact Sheet for Healthcare Providers: quierodirigir.com This test is not yet approved or cleared by the [place] FDA and  has been authorized for detection and/or diagnosis of SARS-CoV-2 by FDA under an Emergency Use Authorization (EUA). This EUA will remain  in effect (meaning this test can be used) for the duration of the COVID-19 declaration under Section 56 4(b)(1) of the Act, 21 U.S.C. section 360bbb-3(b)(1), unless the authorization is terminated or revoked sooner. Performed at [facility], [address], [place], [place] [address]   Culture, Urine     Status: Abnormal   Collection Time: 09/01/19 10:35  PM   Specimen: Urine, Clean Catch  Result Value Ref Range Status   Specimen Description   Final    URINE, CLEAN CATCH Performed at [facility], [address], [place], [place] [address]    Special Requests   Final    NONE Performed at [facility], [address], [place], [place] [address]    Culture 50,000 COLONIES/mL PROTEUS MIRABILIS (A)  Final   Report Status 09/03/2019 FINAL  Final   Organism ID, Bacteria PROTEUS MIRABILIS (A)  Final      Susceptibility   Proteus mirabilis - MIC*    AMPICILLIN <=2 SENSITIVE Sensitive     CEFAZOLIN <=4 SENSITIVE Sensitive     CEFTRIAXONE <=0.25 SENSITIVE Sensitive     CIPROFLOXACIN <=0.25 SENSITIVE Sensitive     GENTAMICIN <=1 SENSITIVE Sensitive     IMIPENEM 1 SENSITIVE Sensitive     NITROFURANTOIN 128 RESISTANT Resistant     TRIMETH/SULFA <=20 SENSITIVE Sensitive     AMPICILLIN/SULBACTAM <=2 SENSITIVE Sensitive     PIP/TAZO <=4 SENSITIVE Sensitive     * 50,000 COLONIES/mL PROTEUS MIRABILIS  Culture, blood (routine x 2)     Status: None (Preliminary result)   Collection Time: 09/01/19 10:48 PM   Specimen: BLOOD  Result Value Ref Range Status   Specimen Description   Final    BLOOD BLOOD LEFT FOREARM Performed at [facility], [address] [place], [place] [address]    Special Requests   Final    BOTTLES DRAWN AEROBIC AND ANAEROBIC Blood Culture adequate volume Performed at [facility], [address] [place], [place] [address]    Culture  Final    NO GROWTH 3 DAYS Performed at [facility], [address] [place], [place] [address]    Report Status PENDING  Incomplete  Culture, blood (routine x 2)     Status: None (Preliminary result)   Collection Time: 09/01/19 11:52 PM   Specimen: BLOOD  Result Value Ref Range Status   Specimen Description   Final    BLOOD LEFT ANTECUBITAL Performed at [facility], [address] [place], [place] [address]    Special Requests   Final    BOTTLES DRAWN AEROBIC AND ANAEROBIC Blood Culture adequate volume Performed at [facility], [address], [place], [place] [address]    Culture   Final    NO GROWTH 3 DAYS Performed at [facility], [address], [place], [place] [address]    Report Status PENDING  Incomplete     Time coordinating discharge: Over 30 minutes  SIGNED:   Laverna Peace, MD  [facility] 09/05/2019, 2:10 PM Pager   If 7PM-7AM, please contact night-coverage [email or web address] Password [email or web address]

## 2019-09-05 NOTE — Care Management Important Message (Signed)
Important Message  Patient Details IM Letter given to Sharol Roussel RN Case Manager to present to the Patient Name: Jenna Carrillo MRN: [id number] Date of Birth: [birth year]   Medicare Important Message Given:  Yes     [name] 09/05/2019, 10:28 AM

## 2019-09-05 NOTE — Progress Notes (Signed)
Pt is being discharged home. Discharge instructions including medications and follow up appointments given. Pt had no further questions at this time. 

## 2019-09-07 LAB — CULTURE, BLOOD (ROUTINE X 2)
Culture: NO GROWTH
Culture: NO GROWTH
Special Requests: ADEQUATE
Special Requests: ADEQUATE

## 2019-09-18 DIAGNOSIS — N390 Urinary tract infection, site not specified: Secondary | ICD-10-CM | POA: Diagnosis present

## 2019-09-18 DIAGNOSIS — A498 Other bacterial infections of unspecified site: Secondary | ICD-10-CM | POA: Diagnosis present

## 2019-09-21 ENCOUNTER — Inpatient Hospital Stay (HOSPITAL_COMMUNITY): Payer: Medicare Other

## 2019-09-21 ENCOUNTER — Inpatient Hospital Stay (HOSPITAL_COMMUNITY)
Admission: EM | Admit: 2019-09-21 | Discharge: 2019-09-24 | DRG: 871 | Disposition: A | Discharge status: Home Health | Payer: Medicare Other | Attending: Student | Admitting: Student

## 2019-09-21 ENCOUNTER — Encounter (HOSPITAL_COMMUNITY): Payer: Self-pay

## 2019-09-21 ENCOUNTER — Other Ambulatory Visit: Payer: Self-pay

## 2019-09-21 ENCOUNTER — Emergency Department (HOSPITAL_COMMUNITY): Payer: Medicare Other

## 2019-09-21 DIAGNOSIS — K219 Gastro-esophageal reflux disease without esophagitis: Secondary | ICD-10-CM | POA: Diagnosis present

## 2019-09-21 DIAGNOSIS — Z7952 Long term (current) use of systemic steroids: Secondary | ICD-10-CM

## 2019-09-21 DIAGNOSIS — Z79899 Other long term (current) drug therapy: Secondary | ICD-10-CM

## 2019-09-21 DIAGNOSIS — I959 Hypotension, unspecified: Secondary | ICD-10-CM | POA: Diagnosis present

## 2019-09-21 DIAGNOSIS — I5021 Acute systolic (congestive) heart failure: Secondary | ICD-10-CM | POA: Diagnosis present

## 2019-09-21 DIAGNOSIS — K449 Diaphragmatic hernia without obstruction or gangrene: Secondary | ICD-10-CM | POA: Diagnosis present

## 2019-09-21 DIAGNOSIS — R748 Abnormal levels of other serum enzymes: Secondary | ICD-10-CM | POA: Diagnosis not present

## 2019-09-21 DIAGNOSIS — J471 Bronchiectasis with (acute) exacerbation: Secondary | ICD-10-CM | POA: Diagnosis present

## 2019-09-21 DIAGNOSIS — F39 Unspecified mood [affective] disorder: Secondary | ICD-10-CM | POA: Diagnosis present

## 2019-09-21 DIAGNOSIS — E785 Hyperlipidemia, unspecified: Secondary | ICD-10-CM | POA: Diagnosis present

## 2019-09-21 DIAGNOSIS — I10 Essential (primary) hypertension: Secondary | ICD-10-CM | POA: Diagnosis present

## 2019-09-21 DIAGNOSIS — Z66 Do not resuscitate: Secondary | ICD-10-CM | POA: Diagnosis present

## 2019-09-21 DIAGNOSIS — J189 Pneumonia, unspecified organism: Secondary | ICD-10-CM | POA: Diagnosis not present

## 2019-09-21 DIAGNOSIS — M353 Polymyalgia rheumatica: Secondary | ICD-10-CM | POA: Diagnosis present

## 2019-09-21 DIAGNOSIS — Z882 Allergy status to sulfonamides status: Secondary | ICD-10-CM

## 2019-09-21 DIAGNOSIS — J181 Lobar pneumonia, unspecified organism: Secondary | ICD-10-CM | POA: Diagnosis present

## 2019-09-21 DIAGNOSIS — N179 Acute kidney failure, unspecified: Secondary | ICD-10-CM | POA: Diagnosis present

## 2019-09-21 DIAGNOSIS — I5032 Chronic diastolic (congestive) heart failure: Secondary | ICD-10-CM | POA: Diagnosis present

## 2019-09-21 DIAGNOSIS — I340 Nonrheumatic mitral (valve) insufficiency: Secondary | ICD-10-CM | POA: Diagnosis not present

## 2019-09-21 DIAGNOSIS — A419 Sepsis, unspecified organism: Principal | ICD-10-CM | POA: Diagnosis present

## 2019-09-21 DIAGNOSIS — E872 Acidosis: Secondary | ICD-10-CM | POA: Diagnosis present

## 2019-09-21 DIAGNOSIS — F0390 Unspecified dementia without behavioral disturbance: Secondary | ICD-10-CM | POA: Diagnosis present

## 2019-09-21 DIAGNOSIS — G9341 Metabolic encephalopathy: Secondary | ICD-10-CM | POA: Diagnosis present

## 2019-09-21 DIAGNOSIS — E559 Vitamin D deficiency, unspecified: Secondary | ICD-10-CM | POA: Diagnosis present

## 2019-09-21 DIAGNOSIS — Z8249 Family history of ischemic heart disease and other diseases of the circulatory system: Secondary | ICD-10-CM

## 2019-09-21 DIAGNOSIS — N390 Urinary tract infection, site not specified: Secondary | ICD-10-CM | POA: Diagnosis present

## 2019-09-21 DIAGNOSIS — Z20822 Contact with and (suspected) exposure to covid-19: Secondary | ICD-10-CM | POA: Diagnosis present

## 2019-09-21 DIAGNOSIS — I4891 Unspecified atrial fibrillation: Secondary | ICD-10-CM | POA: Diagnosis present

## 2019-09-21 DIAGNOSIS — Z87440 Personal history of urinary (tract) infections: Secondary | ICD-10-CM

## 2019-09-21 DIAGNOSIS — E039 Hypothyroidism, unspecified: Secondary | ICD-10-CM | POA: Diagnosis present

## 2019-09-21 DIAGNOSIS — I351 Nonrheumatic aortic (valve) insufficiency: Secondary | ICD-10-CM | POA: Diagnosis not present

## 2019-09-21 DIAGNOSIS — Z789 Other specified health status: Secondary | ICD-10-CM | POA: Diagnosis not present

## 2019-09-21 DIAGNOSIS — Y95 Nosocomial condition: Secondary | ICD-10-CM | POA: Diagnosis present

## 2019-09-21 DIAGNOSIS — Z825 Family history of asthma and other chronic lower respiratory diseases: Secondary | ICD-10-CM

## 2019-09-21 DIAGNOSIS — R0902 Hypoxemia: Secondary | ICD-10-CM | POA: Diagnosis present

## 2019-09-21 DIAGNOSIS — I110 Hypertensive heart disease with heart failure: Secondary | ICD-10-CM | POA: Diagnosis present

## 2019-09-21 DIAGNOSIS — Z7901 Long term (current) use of anticoagulants: Secondary | ICD-10-CM

## 2019-09-21 DIAGNOSIS — R32 Unspecified urinary incontinence: Secondary | ICD-10-CM | POA: Diagnosis not present

## 2019-09-21 DIAGNOSIS — D72825 Bandemia: Secondary | ICD-10-CM | POA: Diagnosis not present

## 2019-09-21 DIAGNOSIS — J309 Allergic rhinitis, unspecified: Secondary | ICD-10-CM | POA: Diagnosis present

## 2019-09-21 DIAGNOSIS — J439 Emphysema, unspecified: Secondary | ICD-10-CM | POA: Diagnosis not present

## 2019-09-21 DIAGNOSIS — I214 Non-ST elevation (NSTEMI) myocardial infarction: Secondary | ICD-10-CM | POA: Diagnosis present

## 2019-09-21 DIAGNOSIS — I480 Paroxysmal atrial fibrillation: Secondary | ICD-10-CM | POA: Diagnosis present

## 2019-09-21 DIAGNOSIS — Z8711 Personal history of peptic ulcer disease: Secondary | ICD-10-CM

## 2019-09-21 DIAGNOSIS — E876 Hypokalemia: Secondary | ICD-10-CM | POA: Diagnosis not present

## 2019-09-21 DIAGNOSIS — J449 Chronic obstructive pulmonary disease, unspecified: Secondary | ICD-10-CM | POA: Diagnosis present

## 2019-09-21 DIAGNOSIS — R0600 Dyspnea, unspecified: Secondary | ICD-10-CM

## 2019-09-21 DIAGNOSIS — Z79890 Hormone replacement therapy (postmenopausal): Secondary | ICD-10-CM

## 2019-09-21 DIAGNOSIS — Z87891 Personal history of nicotine dependence: Secondary | ICD-10-CM

## 2019-09-21 LAB — CBC WITH DIFFERENTIAL/PLATELET
Abs Immature Granulocytes: 0.54 K/uL — ABNORMAL HIGH (ref 0.00–0.07)
Basophils Absolute: 0.1 K/uL (ref 0.0–0.1)
Basophils Relative: 0 %
Eosinophils Absolute: 0.1 K/uL (ref 0.0–0.5)
Eosinophils Relative: 1 %
HCT: 33.7 % — ABNORMAL LOW (ref 36.0–46.0)
Hemoglobin: 10.5 g/dL — ABNORMAL LOW (ref 12.0–15.0)
Immature Granulocytes: 2 %
Lymphocytes Relative: 1 %
Lymphs Abs: 0.3 K/uL — ABNORMAL LOW (ref 0.7–4.0)
MCH: 31.3 pg (ref 26.0–34.0)
MCHC: 31.2 g/dL (ref 30.0–36.0)
MCV: 100.6 fL — ABNORMAL HIGH (ref 80.0–100.0)
Monocytes Absolute: 0.6 K/uL (ref 0.1–1.0)
Monocytes Relative: 3 %
Neutro Abs: 21.2 K/uL — ABNORMAL HIGH (ref 1.7–7.7)
Neutrophils Relative %: 93 %
Platelets: 202 K/uL (ref 150–400)
RBC: 3.35 MIL/uL — ABNORMAL LOW (ref 3.87–5.11)
RDW: 13.6 % (ref 11.5–15.5)
WBC: 22.8 K/uL — ABNORMAL HIGH (ref 4.0–10.5)
nRBC: 0 % (ref 0.0–0.2)

## 2019-09-21 LAB — VITAMIN D 25 HYDROXY (VIT D DEFICIENCY, FRACTURES): Vit D, 25-Hydroxy: 21.68 ng/mL — ABNORMAL LOW (ref 30–100)

## 2019-09-21 LAB — RESPIRATORY PANEL BY RT PCR (FLU A&B, COVID)
Influenza A by PCR: NEGATIVE
Influenza B by PCR: NEGATIVE
SARS Coronavirus 2 by RT PCR: NEGATIVE

## 2019-09-21 LAB — MRSA PCR SCREENING: MRSA by PCR: NEGATIVE

## 2019-09-21 LAB — TROPONIN I (HIGH SENSITIVITY)
Troponin I (High Sensitivity): 1220 ng/L (ref <18)
Troponin I (High Sensitivity): 1088 ng/L (ref <18)

## 2019-09-21 LAB — COMPREHENSIVE METABOLIC PANEL
ALT: 65 U/L — ABNORMAL HIGH (ref 0–44)
AST: 105 U/L — ABNORMAL HIGH (ref 15–41)
Albumin: 2.8 g/dL — ABNORMAL LOW (ref 3.5–5.0)
Alkaline Phosphatase: 115 U/L (ref 38–126)
Anion gap: 7 (ref 5–15)
BUN: 22 mg/dL (ref 8–23)
CO2: 29 mmol/L (ref 22–32)
Calcium: 8 mg/dL — ABNORMAL LOW (ref 8.9–10.3)
Chloride: 105 mmol/L (ref 98–111)
Creatinine, Ser: 1.61 mg/dL — ABNORMAL HIGH (ref 0.44–1.00)
GFR calc Af Amer: 33 mL/min — ABNORMAL LOW (ref >60)
GFR calc non Af Amer: 29 mL/min — ABNORMAL LOW (ref >60)
Glucose, Bld: 96 mg/dL (ref 70–99)
Potassium: 4.1 mmol/L (ref 3.5–5.1)
Sodium: 141 mmol/L (ref 135–145)
Total Bilirubin: 0.8 mg/dL (ref 0.3–1.2)
Total Protein: 5.7 g/dL — ABNORMAL LOW (ref 6.5–8.1)

## 2019-09-21 LAB — GLUCOSE, CAPILLARY
Glucose-Capillary: 95 mg/dL (ref 70–99)
Glucose-Capillary: 94 mg/dL (ref 70–99)

## 2019-09-21 LAB — AMMONIA: Ammonia: 40 umol/L — ABNORMAL HIGH (ref 9–35)

## 2019-09-21 LAB — PROCALCITONIN: Procalcitonin: 28.11 ng/mL

## 2019-09-21 LAB — BRAIN NATRIURETIC PEPTIDE: B Natriuretic Peptide: 1475.7 pg/mL — ABNORMAL HIGH (ref 0.0–100.0)

## 2019-09-21 LAB — LACTIC ACID, PLASMA
Lactic Acid, Venous: 2.4 mmol/L (ref 0.5–1.9)
Lactic Acid, Venous: 1.7 mmol/L (ref 0.5–1.9)

## 2019-09-21 LAB — HEMOGLOBIN A1C
Hgb A1c MFr Bld: 5.6 % (ref 4.8–5.6)
Mean Plasma Glucose: 114.02 mg/dL

## 2019-09-21 LAB — PROTIME-INR
INR: 1.8 — ABNORMAL HIGH (ref 0.8–1.2)
Prothrombin Time: 20 seconds — ABNORMAL HIGH (ref 11.4–15.2)

## 2019-09-21 LAB — TSH: TSH: 2.439 u[IU]/mL (ref 0.350–4.500)

## 2019-09-21 LAB — VITAMIN B12: Vitamin B-12: 490 pg/mL (ref 180–914)

## 2019-09-21 MED ORDER — AMITRIPTYLINE HCL 25 MG PO TABS
50.0000 mg | ORAL_TABLET | Freq: Every day | ORAL | Status: DC
  Administered 2019-09-21 – 2019-09-23 (×3): 50 mg via ORAL
  Filled 2019-09-21 (×3): qty 2

## 2019-09-21 MED ORDER — ONDANSETRON HCL 4 MG PO TABS: 4.0000 mg | ORAL_TABLET | Freq: Four times a day (QID) | ORAL | Status: DC | PRN

## 2019-09-21 MED ORDER — ONDANSETRON HCL 4 MG/2ML IJ SOLN: 4.0000 mg | Freq: Four times a day (QID) | INTRAMUSCULAR | Status: DC | PRN

## 2019-09-21 MED ORDER — ADULT MULTIVITAMIN W/MINERALS CH
1.0000 | ORAL_TABLET | Freq: Every day | ORAL | Status: DC
  Administered 2019-09-22 – 2019-09-24 (×3): 1 via ORAL
  Filled 2019-09-21 (×3): qty 1

## 2019-09-21 MED ORDER — BISACODYL 5 MG PO TBEC: 5.0000 mg | DELAYED_RELEASE_TABLET | Freq: Every day | ORAL | Status: DC | PRN

## 2019-09-21 MED ORDER — LEVOTHYROXINE SODIUM 75 MCG PO TABS
75.0000 ug | ORAL_TABLET | Freq: Every day | ORAL | Status: DC
  Administered 2019-09-22 – 2019-09-24 (×3): 75 ug via ORAL
  Filled 2019-09-21 (×3): qty 1

## 2019-09-21 MED ORDER — INSULIN ASPART 100 UNIT/ML ~~LOC~~ SOLN: 0.0000 [IU] | Freq: Every day | SUBCUTANEOUS | Status: DC

## 2019-09-21 MED ORDER — POLYETHYLENE GLYCOL 3350 17 G PO PACK: 17.0000 g | PACK | Freq: Every day | ORAL | Status: DC | PRN

## 2019-09-21 MED ORDER — SODIUM CHLORIDE 0.9 % IV BOLUS
1000.0000 mL | Freq: Once | INTRAVENOUS | Status: AC
  Administered 2019-09-21: 15:00:00 1000 mL via INTRAVENOUS

## 2019-09-21 MED ORDER — APIXABAN 2.5 MG PO TABS
2.5000 mg | ORAL_TABLET | Freq: Two times a day (BID) | ORAL | Status: DC
  Administered 2019-09-21 – 2019-09-24 (×6): 2.5 mg via ORAL
  Filled 2019-09-21 (×6): qty 1

## 2019-09-21 MED ORDER — VANCOMYCIN HCL 500 MG/100ML IV SOLN: 500.0000 mg | INTRAVENOUS | Status: DC

## 2019-09-21 MED ORDER — ORAL CARE MOUTH RINSE
15.0000 mL | Freq: Two times a day (BID) | OROMUCOSAL | Status: DC
  Administered 2019-09-21 – 2019-09-24 (×5): 15 mL via OROMUCOSAL

## 2019-09-21 MED ORDER — INSULIN ASPART 100 UNIT/ML ~~LOC~~ SOLN
0.0000 [IU] | Freq: Three times a day (TID) | SUBCUTANEOUS | Status: DC
  Administered 2019-09-22: 1 [IU] via SUBCUTANEOUS

## 2019-09-21 MED ORDER — VANCOMYCIN HCL IN DEXTROSE 1-5 GM/200ML-% IV SOLN
1000.0000 mg | Freq: Once | INTRAVENOUS | Status: AC
  Administered 2019-09-21: 15:00:00 1000 mg via INTRAVENOUS
  Filled 2019-09-21: qty 200

## 2019-09-21 MED ORDER — SENNOSIDES-DOCUSATE SODIUM 8.6-50 MG PO TABS: 2.0000 | ORAL_TABLET | Freq: Every evening | ORAL | Status: DC | PRN

## 2019-09-21 MED ORDER — PANTOPRAZOLE SODIUM 40 MG PO TBEC
40.0000 mg | DELAYED_RELEASE_TABLET | Freq: Every day | ORAL | Status: DC
  Administered 2019-09-21 – 2019-09-24 (×4): 40 mg via ORAL
  Filled 2019-09-21 (×4): qty 1

## 2019-09-21 MED ORDER — PREDNISONE 1 MG PO TABS
3.0000 mg | ORAL_TABLET | Freq: Every day | ORAL | Status: DC
  Administered 2019-09-22 – 2019-09-24 (×3): 3 mg via ORAL
  Filled 2019-09-21 (×3): qty 3

## 2019-09-21 MED ORDER — CHLORHEXIDINE GLUCONATE CLOTH 2 % EX PADS
6.0000 | MEDICATED_PAD | Freq: Every day | CUTANEOUS | Status: DC
  Administered 2019-09-21 (×2): 6 via TOPICAL

## 2019-09-21 MED ORDER — SODIUM CHLORIDE 0.9 % IV SOLN: 2.0000 g | INTRAVENOUS | Status: DC

## 2019-09-21 MED ORDER — HYDROCORTISONE NA SUCCINATE PF 100 MG IJ SOLR
50.0000 mg | Freq: Three times a day (TID) | INTRAMUSCULAR | Status: AC
  Administered 2019-09-21 – 2019-09-22 (×3): 50 mg via INTRAVENOUS
  Filled 2019-09-21 (×3): qty 2

## 2019-09-21 MED ORDER — SODIUM CHLORIDE 0.9 % IV SOLN
2.0000 g | Freq: Once | INTRAVENOUS | Status: AC
  Administered 2019-09-21: 2 g via INTRAVENOUS
  Filled 2019-09-21: qty 2

## 2019-09-21 MED ORDER — SODIUM CHLORIDE 0.9 % IV SOLN: INTRAVENOUS | Status: DC

## 2019-09-21 NOTE — ED Notes (Signed)
Pure wick has been placed. Suction set to 45mmHg.  

## 2019-09-21 NOTE — Progress Notes (Signed)
Per order, performed bladder scan with volume of 45mL. Unable to complete in/out cath for urinalysis and urine culture. MD made aware. New orders received. Will continue to monitor.

## 2019-09-21 NOTE — ED Notes (Signed)
Date and time results received: 09/21/19 4:39 PM  (use smartphrase ".now" to insert current time)  Test: Trop Critical Value: 1088  Name of Provider Notified: Nelson Chimes  Orders Received? Or Actions Taken?: Orders Received - See Orders for details

## 2019-09-21 NOTE — H&P (Signed)
History and Physical    Jenna Carrillo ZOX:[id number] DOB: 06/13/1930 DOA: 09/21/2019  PCP: Creola Corn, MD Patient coming from: Home  Chief Complaint: Confusion  HPI: Jenna Carrillo is a 86 y.o. female with medical history significant of COPD/GERD, polymyalgia rheumatica on prednisone, recurrent UTIs, dementia, hypothyroidism, HTN, HLD brought to the hospital from home for evaluation of altered mental status.  Patient was admitted here about a month ago for altered mental status secondary to pneumonia and UTI, treated and was discharged home.  Over the last couple of days family has noted that patient has been more confused, poor appetite.  This morning they noted her to be hypoxic and hypotensive therefore called EMS who brought her to the hospital.  I spoke with the family who confirms that patient is somewhat functional at home but confused, ambulates with cane.  At times understands the conversation but does not fully participate or carry on long conversations.  Compliant with her medications.  Today in the ER she was noted to be altered, AKI with creatinine of 1.6.  Baseline 1.0, elevated BNP, elevated troponin, lactic acidosis and leukocytosis.  Given vancomycin and cefepime, medical team requested admit the patient.  Cardiology was notified who recommended trending the troponin and obtaining echocardiogram.  EKG was unremarkable.   Review of Systems: As per HPI otherwise 10 point review of systems negative.  Review of Systems Otherwise negative except as per HPI, including: General: Denies fever, chills, night sweats or unintended weight loss. Resp: Denies cough, wheezing, shortness of breath. Cardiac: Denies chest pain, palpitations, orthopnea, paroxysmal nocturnal dyspnea. GI: Denies abdominal pain, nausea, vomiting, diarrhea or constipation GU: Denies dysuria, frequency, hesitancy or incontinence MS: Denies muscle aches, joint pain or swelling Neuro: Denies headache, neurologic  deficits (focal weakness, numbness, tingling), abnormal gait Psych: Denies anxiety, depression, SI/HI/AVH Skin: Denies new rashes or lesions ID: Denies sick contacts, exotic exposures, travel  Past Medical History:  Diagnosis Date  . Bronchiectasis   . COPD (chronic obstructive pulmonary disease) (HCC)   . Degenerative disk disease   . Eczema   . Gastric ulcer   . GERD (gastroesophageal reflux disease)   . Hiatal hernia   . Hyperlipidemia   . Hypertension   . Hypothyroidism   . Osteoarthritis   . Polymyalgia rheumatica (HCC)   . Pulmonary nodule, right   . Vertebrobasilar insufficiency     Past Surgical History:  Procedure Laterality Date  . BREAST CYST EXCISION     right  . broken tail bone    . CAROTID ENDARTERECTOMY     right  . RHINOPLASTY    . TOTAL ABDOMINAL HYSTERECTOMY      SOCIAL HISTORY:  reports that she quit smoking about 61 years ago. Her smoking use included cigarettes. She has a 4.00 pack-year smoking history. She has never used smokeless tobacco. She reports that she does not drink alcohol or use drugs.  Allergies  Allergen Reactions  . Sulfonamide Derivatives Rash    FAMILY HISTORY: Family History  Problem Relation Age of Onset  . COPD Mother   . Breast cancer Mother   . Emphysema Mother   . Liver disease Son   . Emphysema Father   . Heart attack Father      Prior to Admission medications   Medication Sig Start Date End Date Taking? Authorizing Provider  albuterol (VENTOLIN HFA) 108 (90 Base) MCG/ACT inhaler Inhale 1 puff into the lungs every 4 (four) hours as needed for wheezing. 08/10/19  Yes  [provider]  amitriptyline (ELAVIL) 50 MG tablet Take 50 mg by mouth at bedtime.   Yes [provider]  apixaban (ELIQUIS) 2.5 MG TABS tablet Take 1 tablet (2.5 mg total) by mouth 2 (two) times daily. 04/07/19  Yes O'Neal, Ronnald Ramp, MD  benzonatate (TESSALON) 200 MG capsule Take 1 capsule (200 mg total) by mouth 3 (three)  times daily as needed for cough. Patient taking differently: Take 200 mg by mouth in the morning and at bedtime.  01/30/19  Yes Rolly Salter, MD  carvedilol (COREG) 12.5 MG tablet Take 1 tablet (12.5 mg total) by mouth 2 (two) times daily with a meal. 04/07/19  Yes O'Neal, Ronnald Ramp, MD  Dextromethorphan-guaiFENesin (DELSYM COUGH/CHEST CONGEST DM PO) Take 1 tablet by mouth every 4 (four) hours as needed (cough,congestion).   Yes [provider]  furosemide (LASIX) 20 MG tablet Take 1 tablet (20 mg total) by mouth daily as needed for fluid or edema (Weight gain of more than 3 pounds in 1 day or 5 pounds in 2 days). 01/30/19 01/30/20 Yes Rolly Salter, MD  ipratropium-albuterol (DUONEB) 0.5-2.5 (3) MG/3ML SOLN Inhale 3 mLs into the lungs 2 (two) times daily as needed for cough. 08/08/19  Yes [provider]  levalbuterol (XOPENEX HFA) 45 MCG/ACT inhaler Inhale 1 puff into the lungs every 4 (four) hours as needed for wheezing. 01/30/19 01/30/20 Yes Rolly Salter, MD  levothyroxine (SYNTHROID) 75 MCG tablet Take 1 tablet (75 mcg total) by mouth daily at 6 (six) AM. 01/31/19  Yes Rolly Salter, MD  Multiple Vitamin (MULTIVITAMIN) tablet Take 1 tablet by mouth daily.     Yes [provider]  nitrofurantoin (MACRODANTIN) 100 MG capsule Take 100 mg by mouth 2 (two) times daily. 09/20/19  Yes [provider]  omeprazole (PRILOSEC) 20 MG capsule Take 20 mg by mouth daily.    Yes [provider]  predniSONE (DELTASONE) 1 MG tablet Take 3 mg by mouth daily.    Yes [provider]  tiotropium (SPIRIVA HANDIHALER) 18 MCG inhalation capsule PLACE 1 CAPSULE INTO THE INHALER AND INHALE DAILY Patient taking differently: Place 18 mcg into inhaler and inhale daily.  08/01/19  Yes Parrett, Virgel Bouquet, NP  Spacer/Aero-Holding Chambers (AEROCHAMBER MV) inhaler Use as instructed 08/01/19   Julio Sicks, NP    Physical Exam: Vitals:   09/21/19 1428 09/21/19 1500  09/21/19 1630  BP: (!) 95/50 (!) 93/55 101/70  Pulse: 85 79 80  Resp: (!) 25 (!) 23 20  Temp: 98.3 F (36.8 C)    TempSrc: Oral    SpO2: 100% 100% 99%      Constitutional: Not in acute distress, pleasantly confused, elderly frail. Eyes: PERRL, lids and conjunctivae normal ENMT: Mucous membranes are dry posterior pharynx clear of any exudate or lesions.Normal dentition.  Neck: normal, supple, no masses, no thyromegaly Respiratory: Bilateral diminished breath sounds with basilar crackles Cardiovascular: Mild sinus tachycardia with heart rate of 95-100 Abdomen: no tenderness, no masses palpated. No hepatosplenomegaly. Bowel sounds positive.  Musculoskeletal: no clubbing / cyanosis. No joint deformity upper and lower extremities. Good ROM, no contractures. Normal muscle tone.  Skin: no rashes, lesions, ulcers. No induration Neurologic: Nonfocal exam but difficult to perform full examination. Psychiatric: Alert to name only.  Follows basic commands.  Poor judgment and insight.  Labs on Admission: I have personally reviewed following labs and imaging studies  CBC: Recent Labs  Lab 09/21/19 1421  WBC 22.8*  NEUTROABS  21.2*  HGB 10.5*  HCT 33.7*  MCV 100.6*  PLT 202   Basic Metabolic Panel: Recent Labs  Lab 09/21/19 1421  NA 141  K 4.1  CL 105  CO2 29  GLUCOSE 96  BUN 22  CREATININE 1.61*  CALCIUM 8.0*   GFR: CrCl cannot be calculated (Unknown ideal weight.). Liver Function Tests: Recent Labs  Lab 09/21/19 1421  AST 105*  ALT 65*  ALKPHOS 115  BILITOT 0.8  PROT 5.7*  ALBUMIN 2.8*   No results for input(s): LIPASE, AMYLASE in the last 168 hours. No results for input(s): AMMONIA in the last 168 hours. Coagulation Profile: Recent Labs  Lab 09/21/19 1421  INR 1.8*   Cardiac Enzymes: No results for input(s): CKTOTAL, CKMB, CKMBINDEX, TROPONINI in the last 168 hours. BNP (last 3 results) No results for input(s): PROBNP in the last 8760 hours. HbA1C: No  results for input(s): HGBA1C in the last 72 hours. CBG: No results for input(s): GLUCAP in the last 168 hours. Lipid Profile: No results for input(s): CHOL, HDL, LDLCALC, TRIG, CHOLHDL, LDLDIRECT in the last 72 hours. Thyroid Function Tests: No results for input(s): TSH, T4TOTAL, FREET4, T3FREE, THYROIDAB in the last 72 hours. Anemia Panel: No results for input(s): VITAMINB12, FOLATE, FERRITIN, TIBC, IRON, RETICCTPCT in the last 72 hours. Urine analysis:    Component Value Date/Time   COLORURINE YELLOW 09/01/2019 1859   APPEARANCEUR CLEAR 09/01/2019 1859   LABSPEC 1.016 09/01/2019 1859   PHURINE 5.0 09/01/2019 1859   GLUCOSEU NEGATIVE 09/01/2019 1859   HGBUR MODERATE (A) 09/01/2019 1859   BILIRUBINUR NEGATIVE 09/01/2019 1859   KETONESUR NEGATIVE 09/01/2019 1859   PROTEINUR NEGATIVE 09/01/2019 1859   NITRITE NEGATIVE 09/01/2019 1859   LEUKOCYTESUR NEGATIVE 09/01/2019 1859   Sepsis Labs: !!!!!!!!!!!!!!!!!!!!!!!!!!!!!!!!!!!!!!!!!!!! @LABRCNTIP (procalcitonin:4,lacticidven:4) )No results found for this or any previous visit (from the past 240 hour(s)).   Radiological Exams on Admission: DG Chest Port 1 View  Addendum Date: 09/21/2019   ADDENDUM REPORT: 09/21/2019 14:59 ADDENDUM: These results were called by telephone at the time of interpretation on 09/21/2019 at 2:59 pm to provider [facility] , who verbally acknowledged these results. Electronically Signed   By: [facility] DO   On: 09/21/2019 14:59   Result Date: 09/21/2019 CLINICAL DATA:  Weakness, altered LOC, family reports recent UTI EXAM: PORTABLE CHEST 1 VIEW COMPARISON:  Prior chest radiographs 09/01/2019 and earlier FINDINGS: Heart size within normal limits. Aortic atherosclerosis. Opacity within the right mid lung suspicious for pneumonia. A linear opacity projects over the right lung apex. No evidence of pleural effusion. No acute bony abnormality identified. IMPRESSION: Opacity within the right mid lung suspicious for  pneumonia. Radiographic follow-up to resolution is recommended. A linear opacity projects over the right lung apex. Lung markings are seen beyond this point and this is favored to reflect a skin fold. However, a repeat radiograph is recommended to definitively exclude a right apical pneumothorax. Aortic Atherosclerosis (ICD10-I70.0). Electronically Signed: By: 09/03/2019 DO On: 09/21/2019 14:50     All images have been reviewed by me personally.  EKG: No acute ST-T changes  Assessment/Plan Principal Problem:   Sepsis secondary to UTI [facility]) Active Problems:   Essential hypertension   COPD (chronic obstructive pulmonary disease) (HCC)   Lobar pneumonia, unspecified organism (HCC)   Bronchiectasis with (acute) exacerbation (HCC)   Sepsis due to pneumonia [facility])   Atrial fibrillation (HCC)   Acute systolic heart failure (HCC)   Acute metabolic encephalopathy    Acute metabolic encephalopathy  Sepsis secondary to urinary tract infection and right lower/middle lobe pneumonia, HCAP Admit patient to progressive care unit due to some hemodynamic instability and s mild hypotension CT of the head as patient is on blood thinners to rule out any intracranial pathology Cultures have been performed, empiric vancomycin and cefepime On home steroids, will do 3 dose of hydrocortisone followed by resumption of home prednisone. Speech and swallow evaluation Respiratory panel/COVID-19-pending  Elevated troponin/elevated BNP Suspect demand ischemia We will repeat echocardiogram.  Cardiology notified by ER.  Recommended conservative management at this time. Echocardiogram EF 43%, grade 2 diastolic dysfunction  Acute kidney injury -Baseline creatinine 1.0, admission creatinine 1.6.  Closely monitor.  Paroxysmal atrial fibrillation Hold off on antihypertensives.  Continue Eliquis twice daily once CT head is negative  History of COPD Bronchodilators.  Polymyalgia rheumatica -On chronic  prednisone.  Hypothyroidism Check TSH, continue Synthroid  Advanced dementia/Mood disorder Delirium protocol.  Monitor closely.  At baseline follows commands and ambulates with cane but does not participate in routine conversation.  DVT prophylaxis: Eliquis Code Status: DNR Family Communication: Spoke with her Son in [place] Consults called: Cardiology - Curbsided by ED Admission status: Inpatietn admit for sepsis  Status is: Inpatient  Remains inpatient appropriate because:Hemodynamically unstable   Dispo: The patient is from: Home              Anticipated d/c is to: Home              Anticipated d/c date is: 3 days              Patient currently is not medically stable to d/c.  Time Spent: 65 minutes.  >50% of the time was devoted to discussing the patients care, assessment, plan and disposition with other care givers along with counseling the patient about the risks and benefits of treatment.    Mehek Grega Arsenio Loader MD [facility]  If 7PM-7AM, please contact night-coverage   09/21/2019, 4:54 PM

## 2019-09-21 NOTE — ED Notes (Signed)
Date and time results received: 09/21/19 3:22 PM  (use smartphrase ".now" to insert current time)  Test: Lactic Critical Value: 2.4  Name of Provider Notified: Charm Barges  Orders Received? Or Actions Taken?: Orders Received - See Orders for details

## 2019-09-21 NOTE — Progress Notes (Signed)
Katherina Right, NP present and gave order that it's okay to give eliquis tonight.

## 2019-09-21 NOTE — Progress Notes (Signed)
A consult was received from an ED physician for vancomycin per pharmacy dosing (for an indication other than meningitis). The patient's profile has been reviewed for ht/wt/allergies/indication/available labs. A one time order has been placed for the above antibiotics.  Further antibiotics/pharmacy consults should be ordered by admitting physician if indicated.                       Bernadene Person, PharmD, BCPS [phone] 09/21/2019, 3:03 PM

## 2019-09-21 NOTE — ED Provider Notes (Signed)
[facility]-EMERGENCY DEPT Provider Note   CSN: [id number] Arrival date & time: 09/21/19  1354     History Chief Complaint  Patient presents with  . Weakness    Jenna Carrillo is a 86 y.o. female.  Level 5 caveat secondary to altered mental status.  86 year old female who lives at home with daughter.  Recent admission to the hospital for pneumonia and urinary tract infection.  Reportedly was unresponsive.  EMS found the patient conscious and alert but confused.  Has some confusion at baseline.  Blood pressure low somewhat improved with IV fluids administered.  Patient herself denies complaints.  The history is provided by the patient, the EMS personnel and a relative.  Weakness Severity:  Moderate Onset quality:  Gradual Duration:  2 days Timing:  Constant Progression:  Worsening Chronicity:  Recurrent Context: recent infection and urinary tract infection   Relieved by:  Nothing Worsened by:  Nothing Ineffective treatments:  None tried      Past Medical History:  Diagnosis Date  . Bronchiectasis   . COPD (chronic obstructive pulmonary disease) (HCC)   . Degenerative disk disease   . Eczema   . Gastric ulcer   . GERD (gastroesophageal reflux disease)   . Hiatal hernia   . Hyperlipidemia   . Hypertension   . Hypothyroidism   . Osteoarthritis   . Polymyalgia rheumatica (HCC)   . Pulmonary nodule, right   . Vertebrobasilar insufficiency     Patient Active Problem List   Diagnosis Date Noted  . Proteus mirabilis infection 09/18/2019  . Acute lower UTI 09/18/2019  . Metabolic encephalopathy 09/02/2019  . Acute metabolic encephalopathy 09/01/2019  . Leukocytosis 09/01/2019  . Acute systolic heart failure (HCC) 01/28/2019  . Atrial fibrillation (HCC)   . Current chronic use of systemic steroids 01/25/2019  . Encephalopathy 01/25/2019  . Fall from slip, trip, or stumble, initial encounter   . Generalized weakness   . Sepsis due to pneumonia  (HCC) 01/24/2019  . Bronchiectasis with (acute) exacerbation (HCC) 12/20/2018  . Lobar pneumonia, unspecified organism (HCC) 02/15/2018  . Nuclear sclerosis of both eyes 03/05/2016  . Glaucoma suspect, bilateral 03/05/2016  . CAP (community acquired pneumonia) 11/12/2015  . Pulmonary nodules 10/12/2015  . Cough 02/03/2014  . Macular degeneration, left eye 12/28/2013  . Glaucoma suspect, both eyes 12/28/2013  . Allergic rhinitis 11/12/2012  . Nuclear cataract 08/08/2011  . BRONCHIECTASIS 03/09/2009  . COPD (chronic obstructive pulmonary disease) (HCC) 03/09/2009  . Essential hypertension 01/31/2009  . Polymyalgia rheumatica (HCC) 01/31/2009    Past Surgical History:  Procedure Laterality Date  . BREAST CYST EXCISION     right  . broken tail bone    . CAROTID ENDARTERECTOMY     right  . RHINOPLASTY    . TOTAL ABDOMINAL HYSTERECTOMY       OB History   No obstetric history on file.     Family History  Problem Relation Age of Onset  . COPD Mother   . Breast cancer Mother   . Emphysema Mother   . Liver disease Son   . Emphysema Father   . Heart attack Father     Social History   Tobacco Use  . Smoking status: Former Smoker    Packs/day: 2.00    Years: 2.00    Pack years: 4.00    Types: Cigarettes    Quit date: 05/19/1958    Years since quitting: 61.3  . Smokeless tobacco: Never Used  Substance Use Topics  .  Alcohol use: No  . Drug use: Never    Home Medications Prior to Admission medications   Medication Sig Start Date End Date Taking? Authorizing Provider  albuterol (VENTOLIN HFA) 108 (90 Base) MCG/ACT inhaler Inhale 1 puff into the lungs every 4 (four) hours as needed for wheezing. 08/10/19   [provider]  amitriptyline (ELAVIL) 50 MG tablet Take 50 mg by mouth at bedtime.    [provider]  apixaban (ELIQUIS) 2.5 MG TABS tablet Take 1 tablet (2.5 mg total) by mouth 2 (two) times daily. 04/07/19   O'NealRonnald Ramp, MD  benzonatate  (TESSALON) 200 MG capsule Take 1 capsule (200 mg total) by mouth 3 (three) times daily as needed for cough. 01/30/19   Rolly Salter, MD  carvedilol (COREG) 12.5 MG tablet Take 1 tablet (12.5 mg total) by mouth 2 (two) times daily with a meal. 04/07/19   O'Neal, Ronnald Ramp, MD  Dextromethorphan-guaiFENesin (DELSYM COUGH/CHEST CONGEST DM PO) Take 1 tablet by mouth every 4 (four) hours as needed (cough,congestion).    [provider]  furosemide (LASIX) 20 MG tablet Take 1 tablet (20 mg total) by mouth daily as needed for fluid or edema (Weight gain of more than 3 pounds in 1 day or 5 pounds in 2 days). 01/30/19 01/30/20  Rolly Salter, MD  ipratropium-albuterol (DUONEB) 0.5-2.5 (3) MG/3ML SOLN Inhale 3 mLs into the lungs 2 (two) times daily as needed for cough. 08/08/19   [provider]  levalbuterol (XOPENEX HFA) 45 MCG/ACT inhaler Inhale 1 puff into the lungs every 4 (four) hours as needed for wheezing. 01/30/19 01/30/20  Rolly Salter, MD  levothyroxine (SYNTHROID) 75 MCG tablet Take 1 tablet (75 mcg total) by mouth daily at 6 (six) AM. 01/31/19   Rolly Salter, MD  Multiple Vitamin (MULTIVITAMIN) tablet Take 1 tablet by mouth daily.      [provider]  omeprazole (PRILOSEC) 20 MG capsule Take 20 mg by mouth daily as needed (acid reflux).     [provider]  predniSONE (DELTASONE) 1 MG tablet Take 3 mg by mouth daily.     [provider]  Spacer/Aero-Holding Chambers (AEROCHAMBER MV) inhaler Use as instructed 08/01/19   Parrett, Virgel Bouquet, NP  tiotropium (SPIRIVA HANDIHALER) 18 MCG inhalation capsule PLACE 1 CAPSULE INTO THE INHALER AND INHALE DAILY Patient taking differently: Place 18 mcg into inhaler and inhale daily.  08/01/19   Parrett, Virgel Bouquet, NP    Allergies    Sulfonamide derivatives  Review of Systems   Review of Systems  Unable to perform ROS: Dementia  Neurological: Positive for weakness.    Physical Exam Updated Vital Signs BP  (!) 95/50   Pulse 85   Temp 98.3 F (36.8 C) (Oral)   Resp (!) 25   SpO2 100%   Physical Exam Vitals and nursing note reviewed.  Constitutional:      General: She is not in acute distress.    Appearance: Normal appearance. She is well-developed.  HENT:     Head: Normocephalic and atraumatic.  Eyes:     Conjunctiva/sclera: Conjunctivae normal.  Cardiovascular:     Rate and Rhythm: Normal rate and regular rhythm.     Pulses: Normal pulses.     Heart sounds: No murmur.  Pulmonary:     Effort: Pulmonary effort is normal. No respiratory distress.     Breath sounds: Normal breath sounds.  Abdominal:     Palpations: Abdomen is soft.  Tenderness: There is no abdominal tenderness. There is no guarding or rebound.  Musculoskeletal:        General: No deformity or signs of injury. Normal range of motion.     Cervical back: Neck supple.  Skin:    General: Skin is warm and dry.     Capillary Refill: Capillary refill takes less than 2 seconds.  Neurological:     General: No focal deficit present.     Mental Status: She is alert. She is disoriented.     Sensory: No sensory deficit.     Motor: No weakness.     Comments: Alert and oriented x1     ED Results / Procedures / Treatments   Labs (all labs ordered are listed, but only abnormal results are displayed) Labs Reviewed  COMPREHENSIVE METABOLIC PANEL - Abnormal; Notable for the following components:      Result Value   Creatinine, Ser 1.61 (*)    Calcium 8.0 (*)    Total Protein 5.7 (*)    Albumin 2.8 (*)    AST 105 (*)    ALT 65 (*)    GFR calc non Af Amer 29 (*)    GFR calc Af Amer 33 (*)    All other components within normal limits  CBC WITH DIFFERENTIAL/PLATELET - Abnormal; Notable for the following components:   WBC 22.8 (*)    RBC 3.35 (*)    Hemoglobin 10.5 (*)    HCT 33.7 (*)    MCV 100.6 (*)    Neutro Abs 21.2 (*)    Lymphs Abs 0.3 (*)    Abs Immature Granulocytes 0.54 (*)    All other components within  normal limits  LACTIC ACID, PLASMA - Abnormal; Notable for the following components:   Lactic Acid, Venous 2.4 (*)    All other components within normal limits  BRAIN NATRIURETIC PEPTIDE - Abnormal; Notable for the following components:   B Natriuretic Peptide 1,475.7 (*)    All other components within normal limits  PROTIME-INR - Abnormal; Notable for the following components:   Prothrombin Time 20.0 (*)    INR 1.8 (*)    All other components within normal limits  AMMONIA - Abnormal; Notable for the following components:   Ammonia 40 (*)    All other components within normal limits  TROPONIN I (HIGH SENSITIVITY) - Abnormal; Notable for the following components:   Troponin I (High Sensitivity) 1,220 (*)    All other components within normal limits  TROPONIN I (HIGH SENSITIVITY) - Abnormal; Notable for the following components:   Troponin I (High Sensitivity) 1,088 (*)    All other components within normal limits  RESPIRATORY PANEL BY RT PCR (FLU A&B, COVID)  MRSA PCR SCREENING  URINE CULTURE  CULTURE, BLOOD (ROUTINE X 2)  CULTURE, BLOOD (ROUTINE X 2)  LACTIC ACID, PLASMA  TSH  VITAMIN B12  PROCALCITONIN  GLUCOSE, CAPILLARY  URINALYSIS, ROUTINE W REFLEX MICROSCOPIC  URINALYSIS, ROUTINE W REFLEX MICROSCOPIC  VITAMIN D 25 HYDROXY (VIT D DEFICIENCY, FRACTURES)  FOLATE RBC  HEMOGLOBIN A1C  COMPREHENSIVE METABOLIC PANEL  CBC  MAGNESIUM    EKG EKG Interpretation  Date/Time:  Wednesday Sep 21 2019 15:27:52 EDT Ventricular Rate:  77 PR Interval:    QRS Duration: 76 QT Interval:  392 QTC Calculation: 444 R Axis:   52 Text Interpretation: Sinus rhythm Low voltage, precordial leads Abnormal R-wave progression, early transition No significant change since prior 4/21 Confirmed by Aletta Edouard [phone]) on 09/21/2019 3:28:35  PM   Radiology DG Chest Port 1 View  Addendum Date: 09/21/2019   ADDENDUM REPORT: 09/21/2019 14:59 ADDENDUM: These results were called by telephone at the  time of interpretation on 09/21/2019 at 2:59 pm to provider [facility] , who verbally acknowledged these results. Electronically Signed   By: Jackey Loge DO   On: 09/21/2019 14:59   Result Date: 09/21/2019 CLINICAL DATA:  Weakness, altered LOC, family reports recent UTI EXAM: PORTABLE CHEST 1 VIEW COMPARISON:  Prior chest radiographs 09/01/2019 and earlier FINDINGS: Heart size within normal limits. Aortic atherosclerosis. Opacity within the right mid lung suspicious for pneumonia. A linear opacity projects over the right lung apex. No evidence of pleural effusion. No acute bony abnormality identified. IMPRESSION: Opacity within the right mid lung suspicious for pneumonia. Radiographic follow-up to resolution is recommended. A linear opacity projects over the right lung apex. Lung markings are seen beyond this point and this is favored to reflect a skin fold. However, a repeat radiograph is recommended to definitively exclude a right apical pneumothorax. Aortic Atherosclerosis (ICD10-I70.0). Electronically Signed: By: Jackey Loge DO On: 09/21/2019 14:50    Procedures .Critical Care Performed by: Terrilee Files, MD Authorized by: Terrilee Files, MD   Critical care provider statement:    Critical care time (minutes):  45   Critical care time was exclusive of:  Separately billable procedures and treating other patients   Critical care was necessary to treat or prevent imminent or life-threatening deterioration of the following conditions:  CNS failure or compromise and cardiac failure   Critical care was time spent personally by me on the following activities:  Discussions with consultants, evaluation of patient's response to treatment, examination of patient, ordering and performing treatments and interventions, ordering and review of laboratory studies, ordering and review of radiographic studies, pulse oximetry, re-evaluation of patient's condition, obtaining history from patient or surrogate,  review of old charts and development of treatment plan with patient or surrogate   I assumed direction of critical care for this patient from another provider in my specialty: no     (including critical care time)  Medications Ordered in ED Medications  polyethylene glycol (MIRALAX / GLYCOLAX) packet 17 g (has no administration in time range)  senna-docusate (Senokot-S) tablet 2 tablet (has no administration in time range)  amitriptyline (ELAVIL) tablet 50 mg (has no administration in time range)  levothyroxine (SYNTHROID) tablet 75 mcg (has no administration in time range)  pantoprazole (PROTONIX) EC tablet 40 mg (40 mg Oral Given 09/21/19 1740)  apixaban (ELIQUIS) tablet 2.5 mg (has no administration in time range)  multivitamin with minerals tablet 1 tablet (has no administration in time range)  hydrocortisone sodium succinate (SOLU-CORTEF) 100 MG injection 50 mg (50 mg Intravenous Given 09/21/19 1741)  predniSONE (DELTASONE) tablet 3 mg (has no administration in time range)  bisacodyl (DULCOLAX) EC tablet 5 mg (has no administration in time range)  ondansetron (ZOFRAN) tablet 4 mg (has no administration in time range)    Or  ondansetron (ZOFRAN) injection 4 mg (has no administration in time range)  insulin aspart (novoLOG) injection 0-9 Units (0 Units Subcutaneous Not Given 09/21/19 1735)  insulin aspart (novoLOG) injection 0-5 Units (has no administration in time range)  Chlorhexidine Gluconate Cloth 2 % PADS 6 each (6 each Topical Given 09/21/19 1705)  MEDLINE mouth rinse (has no administration in time range)  0.9 %  sodium chloride infusion ( Intravenous New Bag/Given 09/21/19 1745)  ceFEPIme (MAXIPIME) 2 g in sodium  chloride 0.9 % 100 mL IVPB (0 g Intravenous Stopped 09/21/19 1725)  vancomycin (VANCOCIN) IVPB 1000 mg/200 mL premix ( Intravenous Stopped 09/21/19 1641)  sodium chloride 0.9 % bolus 1,000 mL (1,000 mLs Intravenous Bolus from Bag 09/21/19 1524)    ED Course  I have reviewed the  triage vital signs and the nursing notes.  Pertinent labs & imaging results that were available during my care of the patient were reviewed by me and considered in my medical decision making (see chart for details).  Clinical Course as of Sep 20 1948  Wed Sep 21, 2019  1447 Discussed with daughter [name].  She said she was admitted back in April for UTI and pneumonia.  At that time she was confused and coughing.  She has been doing well up until yesterday when the patient thought she might be getting urinary tract infection again.  Her doctor called her in dose of Macrobid which she took 1 dose last night.  Today she was confused and did not recognize her daughter which is how the last infection started.  She called the ambulance.   [MB]  1516 During her last admission she grew out Proteus 50,000 sensitive to everything except Macrobid.   [MB]  1523 Lactate elevated at 2.4.  Pressures soft so we will start fluid although and concerned with her elevated BNP that 30 cc per keg might push her into failure.   [MB]  1531 Cardiac Studies ECHO:01/28/2019 1. Severe hypokinesis of the left ventricular, mid-apical anteroseptal wall, anterior wall and apical segment. 2. The left ventricle has mild-moderately reduced systolic function, with an ejection fraction of 40-45%. The cavity size was normal. Left ventricular diastolic Doppler parameters are consistent with pseudonormalization. Elevated mean left atrial pressure. 3. The right ventricle has normal systolic function. The cavity was normal. There is no increase in right ventricular wall thickness. Right ventricular systolic pressure is severely elevated. 4. Left atrial size was mildly dilated.R atrium normal 5. Moderate pleural effusion in the left lateral region. 6. The mitral valve is degenerative. Moderate thickening of the mitral valve leaflet. Moderate calcification of the mitral valve leaflet. There is moderate mitral annular calcification  present. Mild mitral valve stenosis. 7. Tricuspid valve regurgitation is mild-moderate. 8. Mild thickening of the aortic valve. 9. The aorta is not well visualized unless otherwise noted. 10. The inferior vena cava was normal in size with <50% respiratory variability. 11. No intracardiac thrombi or masses were visualized    [MB]  1531 Call from lab of elevated troponin of 1200.  Have paged cardiology.  EKG does not show any STEMI.   [MB]  1540 Patient was DNR and her last 2 admissions.   [MB]  1558 Giving fluids for elevated lactate.  Last pressure was 110.  She satting 100% lying down mostly flat in bed and denies any chest pain or shortness of breath.  Viewed prior admission in September where she had an NSTEMI and elevated BNP and it was felt to be related to her illness and not acute cardiac issue.   [MB]  1605 Discussed with Dr. Mayford Knife from [facility] cardiology.  She felt that the troponin and elevated BNP were likely reflective of her illness and not an acute cardiac condition.  She feels that hospitalist can admit and repeat an echo in his lungs has not significantly changed would not do anything different.  Recommended that the hospitalist consult them if there was any problems.   [MB]  1618 Discussed with Dr.  Amin [facility] hospitalist to evaluate the patient for admission   [MB]  1621 I updated the patient's daughter about the current tests that are back.  She confirmed that the patient would want to be DNR/DNI although she does not know if there is a true form that exists.   [MB]    Clinical Course User Index [MB] Terrilee Files, MD   MDM Rules/Calculators/A&P                     This patient complains of weakness confusion low blood pressure; this involves an extensive number of treatment Options and is a complaint that carries with it a high risk of complications and Morbidity. The differential includes sepsis, dehydration, pneumonia, UTI, metabolic derangement, anemia,  ACS  I ordered, reviewed and interpreted labs, which included white blood cell count markedly elevated at 22 and elevated creatinine of 1.6.  Elevated LFTs.  BNP elevated at 1400.  Troponin markedly elevated at 1200.  Lactate elevated I ordered medication antibiotics and IV fluids I ordered imaging studies which included chest x-ray and head CT and I independently    visualized and interpreted imaging which showed right-sided pneumonia and no acute Head CT. Additional history obtained from patient's daughter Previous records obtained and reviewed in epic including last few admissions I consulted Dr. Mayford Knife cardiologist along with [facility] hospitalist Dr. Nelson Chimes and discussed lab and imaging findings  Critical Interventions: Identification of sepsis and treatment with fluids and antibiotics  After the interventions stated above, I reevaluated the patient and found blood pressure to be improved.  Respirations remained stable and no significant oxygen requirement.  Will need admission to the hospital for further management.   Final Clinical Impression(s) / ED Diagnoses Final diagnoses:  Sepsis, due to unspecified organism, unspecified whether acute organ dysfunction present [facility])  Healthcare-associated pneumonia  NSTEMI (non-ST elevated myocardial infarction) (HCC)  AKI (acute kidney injury) (HCC)    Rx / DC Orders ED Discharge Orders    None       Terrilee Files, MD 09/21/19 1953

## 2019-09-21 NOTE — Progress Notes (Addendum)
Pharmacy Antibiotic Note  Jenna Carrillo is a 86 y.o. female admitted on 09/21/2019 with UTI and pneumonia.  Pharmacy has been consulted for vancomycin dosing.  Plan:  Vancomycin 1000 mg IV now, then 500 mg IV q36 hr (est AUC 496 based on SCr 1.61; Vd 0.72)  Measure vancomycin AUC at steady state as indicated  SCr daily while on vanc and in AKI  Cefepime 2 g IV q24 hr per MD, dosing appropriate  Note MRSA PCR negative; attending provider no longer available to discuss, but would ask to d/c vanc tomorrow AM (next dose not due until tomorrow night); unlikely to need MRSA coverage for UTI based on her Micro Hx.   Height: 5\' 4"  (162.6 cm) Weight: 46.5 kg (102 lb 8.2 oz) IBW/kg (Calculated) : 54.7  Temp (24hrs), Avg:97.7 F (36.5 C), Min:97.4 F (36.3 C), Max:98.3 F (36.8 C)  Recent Labs  Lab 09/21/19 1421 09/21/19 1556  WBC 22.8*  --   CREATININE 1.61*  --   LATICACIDVEN 2.4* 1.7    Estimated Creatinine Clearance: 18.4 mL/min (A) (by C-G formula based on SCr of 1.61 mg/dL (H)).    Allergies  Allergen Reactions  . Sulfonamide Derivatives Rash    Antimicrobials this admission: 5/5 vanc >>  5/5 cefepime >>   Dose adjustments this admission: n/a  Microbiology results: 5/5 BCx: sent 5/5 UCx: sent  5/5 MRSA PCR: neg  Thank you for allowing pharmacy to be a part of this patient's care.  Fulton Merry A 09/21/2019 8:38 PM

## 2019-09-21 NOTE — ED Notes (Signed)
Date and time results received: 09/21/19 3:23 PM  (use smartphrase ".now" to insert current time)  Test: Trop Critical Value: 1220  Name of Provider Notified: Charm Barges  Orders Received? Or Actions Taken?: Orders Received - See Orders for details

## 2019-09-21 NOTE — ED Triage Notes (Signed)
Pt BIB EMS from home. Pt family called EMS due to pt having LOC. Pt was conscious and alert with EMS on scene. Pt does have confusion at baseline. EMS reports suspected sepsis. BP 87/40. 12 Lead unremarkable. End tidal 35. Pt states "does not feel good". Family reports weakness. Family reports pt currently taking antibiotics for UTI and pneumonia.

## 2019-09-21 NOTE — Progress Notes (Signed)
Paged Katherina Right, NP to confirm if okay to give eliquis at 2200. Head CT complete.

## 2019-09-21 NOTE — Progress Notes (Signed)
Per MD, once CT is completed and negative, Eliquis can be resumed, if not hold Eliquis.

## 2019-09-21 NOTE — Progress Notes (Addendum)
Per daughter, patient completed 2nd dose covid vaccine about 1 month ago.

## 2019-09-22 LAB — URINALYSIS, ROUTINE W REFLEX MICROSCOPIC
Bilirubin Urine: NEGATIVE
Glucose, UA: NEGATIVE mg/dL
Ketones, ur: NEGATIVE mg/dL
Leukocytes,Ua: NEGATIVE
Nitrite: NEGATIVE
Protein, ur: NEGATIVE mg/dL
Specific Gravity, Urine: 1.012 (ref 1.005–1.030)
pH: 5 (ref 5.0–8.0)

## 2019-09-22 LAB — GLUCOSE, CAPILLARY
Glucose-Capillary: 69 mg/dL — ABNORMAL LOW (ref 70–99)
Glucose-Capillary: 89 mg/dL (ref 70–99)
Glucose-Capillary: 100 mg/dL — ABNORMAL HIGH (ref 70–99)
Glucose-Capillary: 123 mg/dL — ABNORMAL HIGH (ref 70–99)
Glucose-Capillary: 104 mg/dL — ABNORMAL HIGH (ref 70–99)

## 2019-09-22 LAB — CBC
HCT: 33.2 % — ABNORMAL LOW (ref 36.0–46.0)
Hemoglobin: 9.8 g/dL — ABNORMAL LOW (ref 12.0–15.0)
MCH: 30.8 pg (ref 26.0–34.0)
MCHC: 29.5 g/dL — ABNORMAL LOW (ref 30.0–36.0)
MCV: 104.4 fL — ABNORMAL HIGH (ref 80.0–100.0)
Platelets: 150 10*3/uL (ref 150–400)
RBC: 3.18 MIL/uL — ABNORMAL LOW (ref 3.87–5.11)
RDW: 13.5 % (ref 11.5–15.5)
WBC: 23.9 10*3/uL — ABNORMAL HIGH (ref 4.0–10.5)
nRBC: 0 % (ref 0.0–0.2)

## 2019-09-22 LAB — FOLATE RBC
Folate, Hemolysate: 509 ng/mL
Folate, RBC: 1510 ng/mL (ref >498)
Hematocrit: 33.7 % — ABNORMAL LOW (ref 34.0–46.6)

## 2019-09-22 LAB — COMPREHENSIVE METABOLIC PANEL
ALT: 85 U/L — ABNORMAL HIGH (ref 0–44)
AST: 117 U/L — ABNORMAL HIGH (ref 15–41)
Albumin: 2.6 g/dL — ABNORMAL LOW (ref 3.5–5.0)
Alkaline Phosphatase: 106 U/L (ref 38–126)
Anion gap: 10 (ref 5–15)
BUN: 21 mg/dL (ref 8–23)
CO2: 23 mmol/L (ref 22–32)
Calcium: 8.1 mg/dL — ABNORMAL LOW (ref 8.9–10.3)
Chloride: 106 mmol/L (ref 98–111)
Creatinine, Ser: 1.1 mg/dL — ABNORMAL HIGH (ref 0.44–1.00)
GFR calc Af Amer: 53 mL/min — ABNORMAL LOW (ref >60)
GFR calc non Af Amer: 45 mL/min — ABNORMAL LOW (ref >60)
Glucose, Bld: 95 mg/dL (ref 70–99)
Potassium: 4.2 mmol/L (ref 3.5–5.1)
Sodium: 139 mmol/L (ref 135–145)
Total Bilirubin: 1.6 mg/dL — ABNORMAL HIGH (ref 0.3–1.2)
Total Protein: 5.4 g/dL — ABNORMAL LOW (ref 6.5–8.1)

## 2019-09-22 LAB — MAGNESIUM: Magnesium: 1.8 mg/dL (ref 1.7–2.4)

## 2019-09-22 MED ORDER — SODIUM CHLORIDE 0.9 % IV SOLN
2.0000 g | Freq: Two times a day (BID) | INTRAVENOUS | Status: DC
  Administered 2019-09-22 – 2019-09-23 (×3): 2 g via INTRAVENOUS
  Filled 2019-09-22 (×4): qty 2

## 2019-09-22 MED ORDER — VITAMIN D 25 MCG (1000 UNIT) PO TABS
1000.0000 [IU] | ORAL_TABLET | Freq: Every day | ORAL | Status: DC
  Administered 2019-09-22 – 2019-09-24 (×3): 1000 [IU] via ORAL
  Filled 2019-09-22 (×3): qty 1

## 2019-09-22 MED ORDER — CARVEDILOL 12.5 MG PO TABS
12.5000 mg | ORAL_TABLET | Freq: Two times a day (BID) | ORAL | Status: DC
  Administered 2019-09-22 – 2019-09-24 (×4): 12.5 mg via ORAL
  Filled 2019-09-22 (×4): qty 1

## 2019-09-22 MED ORDER — QUETIAPINE FUMARATE 25 MG PO TABS
25.0000 mg | ORAL_TABLET | Freq: Every day | ORAL | Status: DC
  Administered 2019-09-22 – 2019-09-23 (×2): 25 mg via ORAL
  Filled 2019-09-22 (×2): qty 1

## 2019-09-22 NOTE — Evaluation (Signed)
Physical Therapy Evaluation Patient Details Name: Jenna Carrillo MRN: [id number] DOB: [birth year] Today's Date: 09/22/2019   History of Present Illness  Jenna Carrillo is a 86 y.o. female with PMH significant of COPD/GERD, polymyalgia rheumatica on prednisone, recurrent UTIs, dementia, hypothyroidism, HTN, HLD. Pt brought to the hospital from home for evaluation of altered mental status.  Patient was admitted ~ one month ago for AMS secondary to pneumonia and UTI, treated and was discharged home.  Over the last couple of days family has noted that patient has been more confused, poor appetite.  On 09/21/19 they noted her to be hypoxic and hypotensive therefore called EMS who brought her to the hospital.    Clinical Impression  Jenna Carrillo is 86 y.o. female admitted with above HPI and diagnosis. Patient is currently limited by functional impairments below (see PT problem list). Patient disoriented to self, time, and situation but aware she is in the hospital. Patient lives with her daughter [name] and is independent at baseline per chart review. Patient will benefit from continued skilled PT interventions to address impairments and progress independence with mobility, recommending HHPT follow up given pt's history of falls. Acute PT will follow and progress as able. Will follow in acute setting.   Follow Up Recommendations Home health PT    Equipment Recommendations  None recommended by PT    Recommendations for Other Services       Precautions / Restrictions Precautions Precautions: Fall Restrictions Weight Bearing Restrictions: No      Mobility  Bed Mobility Overal bed mobility: Needs Assistance Bed Mobility: Supine to Sit;Sit to Supine     Supine to sit: Supervision Sit to supine: Supervision   General bed mobility comments: supervision required for safety and cues to slow pt down. Pt abel to raise trunk and bring LE's in/out of bed without assist.   Transfers Overall  transfer level: Needs assistance Equipment used: Rolling walker (2 wheeled) Transfers: Sit to/from Stand Sit to Stand: Min guard         General transfer comment: min guarda nd ceus for safety. pt requried tactiel/verbal cues for safe hand placement on RW. cues and assist for positioning to sit on BSC and to turn to sit on EOB at EOS.  Ambulation/Gait Ambulation/Gait assistance: Min assist;Min guard Gait Distance (Feet): 6 Feet Assistive device: Rolling walker (2 wheeled) Gait Pattern/deviations: Step-through pattern;Decreased stride length Gait velocity: decr   General Gait Details: assist required for safe position and management of RW, pt slightly unsteady with gait from bed to [facility] in room. HR elevated from 100 to 110's with gait.  Pt unsafe with RW while ambulating back to bed, and walked forward to bed without moving walker. Pt appeared ready to climb over the walker to sit on bed, cues and redirection required for safety.   Stairs            Wheelchair Mobility    Modified Rankin (Stroke Patients Only)       Balance Overall balance assessment: Needs assistance;History of Falls Sitting-balance support: No upper extremity supported;Feet supported Sitting balance-Leahy Scale: Good     Standing balance support: During functional activity;Bilateral upper extremity supported Standing balance-Leahy Scale: Poor            Pertinent Vitals/Pain Pain Assessment: Faces Faces Pain Scale: No hurt Pain Intervention(s): Monitored during session    Home Living Family/patient expects to be discharged to:: Private residence Living Arrangements: Children(lives with daughter [name]; pt's son has MS and is  bed bound) Available Help at Discharge: Family Type of Home: House       Home Layout: One level Home Equipment: Walker - 4 wheels Additional Comments: pt unable to provide history due to cognition. Environment from chart review/prior admission.    Prior Function Level  of Independence: Independent with assistive device(s)         Comments: pt's family reports she ambulates wtih arollator at times but also with no device     Hand Dominance        Extremity/Trunk Assessment   Upper Extremity Assessment Upper Extremity Assessment: Overall WFL for tasks assessed    Lower Extremity Assessment Lower Extremity Assessment: Overall WFL for tasks assessed    Cervical / Trunk Assessment Cervical / Trunk Assessment: Normal  Communication   Communication: No difficulties  Cognition Arousal/Alertness: Awake/alert Behavior During Therapy: WFL for tasks assessed/performed Overall Cognitive Status: History of cognitive impairments - at baseline Area of Impairment: Memory;Following commands;Safety/judgement;Problem solving;Orientation                 Orientation Level: Disoriented to;Person;Situation;Time   Memory: Decreased short-term memory Following Commands: Follows one step commands inconsistently;Follows multi-step commands inconsistently Safety/Judgement: Decreased awareness of deficits;Decreased awareness of safety   Problem Solving: Requires tactile cues;Requires verbal cues General Comments: pt stating "[place]" for name when asked. Pt able to state DOB and that we are at [facility] Pt unaware of time/date and situation. Pt with poor safety awareness and touching/playign with IV sites and tape. Pt with poor safety awareness and required cues for safety throughout.       General Comments General comments (skin integrity, edema, etc.): Pt HR resting at 100 bpm in supine in bed, elevated to 110's with gait and 120's with max of 130 bpm while on BSC for BM. Pt HR decreased to 110's after returning to supine in bed.     Exercises     Assessment/Plan    PT Assessment Patient needs continued PT services  PT Problem List Decreased activity tolerance;Decreased balance;Decreased mobility       PT Treatment  Interventions DME instruction;Gait training;Functional mobility training;Therapeutic activities;Patient/family education;Balance training;Therapeutic exercise    PT Goals (Current goals can be found in the Care Plan section)  Acute Rehab PT Goals Patient Stated Goal: none stated PT Goal Formulation: Patient unable to participate in goal setting Time For Goal Achievement: 10/06/19 Potential to Achieve Goals: Good    Frequency Min 3X/week    AM-PAC PT "6 Clicks" Mobility  Outcome Measure Help needed turning from your back to your side while in a flat bed without using bedrails?: None Help needed moving from lying on your back to sitting on the side of a flat bed without using bedrails?: None Help needed moving to and from a bed to a chair (including a wheelchair)?: A Little Help needed standing up from a chair using your arms (e.g., wheelchair or bedside chair)?: A Little Help needed to walk in hospital room?: A Little Help needed climbing 3-5 steps with a railing? : A Little 6 Click Score: 20    End of Session Equipment Utilized During Treatment: Gait belt Activity Tolerance: Patient tolerated treatment well Patient left: in bed;with call bell/phone within reach;with restraints reapplied Nurse Communication: Mobility status PT Visit Diagnosis: Difficulty in walking, not elsewhere classified (R26.2)    Time: 8315-1761 PT Time Calculation (min) (ACUTE ONLY): 33 min   Charges:   PT Evaluation $PT Eval Moderate Complexity: 1 Mod PT Treatments $  Therapeutic Activity: 8-22 mins       Wynn Maudlin, DPT Physical Therapist with [facility] [phone]  09/22/2019 3:37 PM

## 2019-09-22 NOTE — Progress Notes (Signed)
Discussed with daughter, [name], that pt is confused and trying to get out of bed. I updated her that a posey belt had been ordered and applied to pt for pt safety. Daughter verbalized understanding of posey belt and importance of pt safety

## 2019-09-22 NOTE — Progress Notes (Signed)
CRITICAL VALUE ALERT  Critical Value: cbg 64  Date & Time Notied:  5/6 0905  Provider Notified: Nelson Chimes  Orders Received/Actions taken:Orange juice given to pt, will recheck

## 2019-09-22 NOTE — Progress Notes (Signed)
Notified MD in regards to pt having incontinent episodes and does not hold urine in bladder for in and out cath. Pt also pulls purewick out. MD expressed understanding that UA may be difficult to obtain

## 2019-09-22 NOTE — Progress Notes (Signed)
Urinalysis sent to lab.

## 2019-09-22 NOTE — Progress Notes (Signed)
PROGRESS NOTE    Jenna Carrillo  EXH:[id number] DOB: 12-29-1930 DOA: 09/21/2019 PCP: Creola Corn, MD   Brief Narrative:  86 y.o. female with medical history significant of COPD/GERD, polymyalgia rheumatica on prednisone, recurrent UTIs, dementia, hypothyroidism, HTN, HLD brought to the hospital from home for evaluation of altered mental status.  Diagnosed with UTI, HCAP, elevated troponin, elevated BNP, AKI.  CT head negative for acute pathology, received stress dose steroids due to hypotension.  Cardiology recommended conservative management and obtain echocardiogram for elevated troponin.   Assessment & Plan:   Principal Problem:   Sepsis secondary to UTI [facility]) Active Problems:   Essential hypertension   COPD (chronic obstructive pulmonary disease) (HCC)   Lobar pneumonia, unspecified organism (HCC)   Bronchiectasis with (acute) exacerbation (HCC)   Sepsis due to pneumonia [facility])   Atrial fibrillation (HCC)   Acute systolic heart failure (HCC)   Acute metabolic encephalopathy   Acute metabolic encephalopathy Sepsis secondary to urinary tract infection and right lower/middle lobe pneumonia, HCAP CT head-negative. MRSA screen negative.  Discontinue vancomycin.  Continue cefepime. Difficult to get UA as patient is incontinent now.  She is are received antibiotics.  We will empirically continue antibiotics at this time.  Patient is not allowing external female catheter to be placed.  Would hate to place Foley catheter Status post stress dose steroid.  Resume home prednisone for PMR Speech and swallow evaluation-pending Respiratory panel/COVID-19-negative Procalcitonin-28  Elevated troponin/elevated BNP Suspect demand ischemia Curbside cardiology-recommending repeating echo. Troponin down trended.  No further work-up. Echocardiogram EF 65%, grade 2 diastolic dysfunction  Acute kidney injury, improving -Baseline creatinine 1.0, admission creatinine 1.6.    This morning  1.1  Leukocytosis Combination of steroids and underlying infection  Vitamin D deficiency -Start supplements.  Paroxysmal atrial fibrillation Resume home Eliquis.  History of COPD Bronchodilators.  Polymyalgia rheumatica Daily chronic prednisone  Hypothyroidism TSH normal, continue Synthroid  Advanced dementia/Mood disorder Delirium protocol.  Monitor closely.  At baseline follows commands and ambulates with cane but does not participate in routine conversation.    DVT prophylaxis: Eliquis Code Status: DNR Family Communication: Family updated.  Status is: Inpatient  Remains inpatient appropriate because:Persistent severe electrolyte disturbances   Dispo: The patient is from: Home              Anticipated d/c is to: Home              Anticipated d/c date is: 2 days              Patient currently is not medically stable to d/c.  Still awaiting further culture data, receiving IV antibiotics.   Subjective: Overnight patient's blood pressure remained stable.  This morning she is still confused oriented to her name.  Denies any complaint.  Blood sugars dropped down to 60s therefore oral juice given which improved her blood glucose.  Review of Systems Otherwise negative except as per HPI, including: General: Denies fever, chills, night sweats or unintended weight loss. Resp: Denies cough, wheezing, shortness of breath. Cardiac: Denies chest pain, palpitations, orthopnea, paroxysmal nocturnal dyspnea. GI: Denies abdominal pain, nausea, vomiting, diarrhea or constipation GU: Denies dysuria, frequency, hesitancy or incontinence MS: Denies muscle aches, joint pain or swelling Neuro: Denies headache, neurologic deficits (focal weakness, numbness, tingling), abnormal gait Psych: Denies anxiety, depression, SI/HI/AVH Skin: Denies new rashes or lesions ID: Denies sick contacts, exotic exposures, travel  Examination:  Constitutional: Elderly frail Respiratory: Bibasilar  rhonchi Cardiovascular: Normal sinus rhythm, no rubs Abdomen: Nontender nondistended  good bowel sounds Musculoskeletal: No edema noted Skin: No rashes seen Neurologic: CN 2-12 grossly intact.  And nonfocal Psychiatric: Poor judgment and insight.  Alert to name only  Objective: Vitals:   09/22/19 0400 09/22/19 0500 09/22/19 0600 09/22/19 0700  BP: (!) 122/49 (!) 122/47 (!) 144/57 131/76  Pulse: 76 76 80 83  Resp: 16 15 19 19   Temp:      TempSrc:      SpO2: 100% 97% 100% 100%  Weight:  51.7 kg    Height:        Intake/Output Summary (Last 24 hours) at 09/22/2019 0728 Last data filed at 09/22/2019 0700 Gross per 24 hour  Intake 1276.61 ml  Output --  Net 1276.61 ml   Filed Weights   09/21/19 1707 09/22/19 0500  Weight: 46.5 kg 51.7 kg     Data Reviewed:   CBC: Recent Labs  Lab 09/21/19 1421 09/22/19 0216  WBC 22.8* 23.9*  NEUTROABS 21.2*  --   HGB 10.5* 9.8*  HCT 33.7* 33.2*  MCV 100.6* 104.4*  PLT 202 211   Basic Metabolic Panel: Recent Labs  Lab 09/21/19 1421 09/22/19 0216  NA 141 139  K 4.1 4.2  CL 105 106  CO2 29 23  GLUCOSE 96 95  BUN 22 21  CREATININE 1.61* 1.10*  CALCIUM 8.0* 8.1*  MG  --  1.8   GFR: Estimated Creatinine Clearance: 30 mL/min (A) (by C-G formula based on SCr of 1.1 mg/dL (H)). Liver Function Tests: Recent Labs  Lab 09/21/19 1421 09/22/19 0216  AST 105* 117*  ALT 65* 85*  ALKPHOS 115 106  BILITOT 0.8 1.6*  PROT 5.7* 5.4*  ALBUMIN 2.8* 2.6*   No results for input(s): LIPASE, AMYLASE in the last 168 hours. Recent Labs  Lab 09/21/19 1845  AMMONIA 40*   Coagulation Profile: Recent Labs  Lab 09/21/19 1421  INR 1.8*   Cardiac Enzymes: No results for input(s): CKTOTAL, CKMB, CKMBINDEX, TROPONINI in the last 168 hours. BNP (last 3 results) No results for input(s): PROBNP in the last 8760 hours. HbA1C: Recent Labs    09/21/19 1846  HGBA1C 5.6   CBG: Recent Labs  Lab 09/21/19 1731 09/21/19 2121  GLUCAP 95 94    Lipid Profile: No results for input(s): CHOL, HDL, LDLCALC, TRIG, CHOLHDL, LDLDIRECT in the last 72 hours. Thyroid Function Tests: Recent Labs    09/21/19 1735  TSH 2.439   Anemia Panel: Recent Labs    09/21/19 1735  VITAMINB12 490   Sepsis Labs: Recent Labs  Lab 09/21/19 1421 09/21/19 1556 09/21/19 1735  PROCALCITON  --   --  28.11  LATICACIDVEN 2.4* 1.7  --     Recent Results (from the past 240 hour(s))  Culture, blood (routine x 2)     Status: None (Preliminary result)   Collection Time: 09/21/19  2:06 PM   Specimen: BLOOD  Result Value Ref Range Status   Specimen Description   Final    BLOOD RIGHT ANTECUBITAL Performed at [facility], [place] [address], [place], [place] [address]    Special Requests   Final    BOTTLES DRAWN AEROBIC AND ANAEROBIC Blood Culture adequate volume Performed at [place] [address], [place], [place] [address]    Culture   Final    NO GROWTH < 24 HOURS Performed at [place] [address], [place], [place] [address]    Report Status PENDING  Incomplete  Culture, blood (routine  x 2)     Status: None (Preliminary result)   Collection Time: 09/21/19  2:31 PM   Specimen: BLOOD LEFT FOREARM  Result Value Ref Range Status   Specimen Description   Final    BLOOD LEFT FOREARM Performed at [facility], [address] [place], [place] [address]    Special Requests   Final    BOTTLES DRAWN AEROBIC AND ANAEROBIC Blood Culture results may not be optimal due to an inadequate volume of blood received in culture bottles Performed at [facility], [address] [place], [place] [address]    Culture   Final    NO GROWTH < 24 HOURS Performed at [facility], [address], [place], [place] [address]    Report Status PENDING  Incomplete  Respiratory Panel by RT PCR (Flu A&B, Covid) - Nasopharyngeal Swab     Status: None   Collection Time:  09/21/19  3:56 PM   Specimen: Nasopharyngeal Swab  Result Value Ref Range Status   SARS Coronavirus 2 by RT PCR NEGATIVE NEGATIVE Final    Comment: (NOTE) SARS-CoV-2 target nucleic acids are NOT DETECTED. The SARS-CoV-2 RNA is generally detectable in upper respiratoy specimens during the acute phase of infection. The lowest concentration of SARS-CoV-2 viral copies this assay can detect is 131 copies/mL. A negative result does not preclude SARS-Cov-2 infection and should not be used as the sole basis for treatment or other patient management decisions. A negative result may occur with  improper specimen collection/handling, submission of specimen other than nasopharyngeal swab, presence of viral mutation(s) within the areas targeted by this assay, and inadequate number of viral copies (<131 copies/mL). A negative result must be combined with clinical observations, patient history, and epidemiological information. The expected result is Negative. Fact Sheet for Patients:  https://www.moore.com/ Fact Sheet for Healthcare Providers:  https://www.young.biz/ This test is not yet ap proved or cleared by the [place] FDA and  has been authorized for detection and/or diagnosis of SARS-CoV-2 by FDA under an Emergency Use Authorization (EUA). This EUA will remain  in effect (meaning this test can be used) for the duration of the COVID-19 declaration under Section 564(b)(1) of the Act, 21 U.S.C. section 360bbb-3(b)(1), unless the authorization is terminated or revoked sooner.    Influenza A by PCR NEGATIVE NEGATIVE Final   Influenza B by PCR NEGATIVE NEGATIVE Final    Comment: (NOTE) The Xpert Xpress SARS-CoV-2/FLU/RSV assay is intended as an aid in  the diagnosis of influenza from Nasopharyngeal swab specimens and  should not be used as a sole basis for treatment. Nasal washings and  aspirates are unacceptable for Xpert Xpress SARS-CoV-2/FLU/RSV   testing. Fact Sheet for Patients: https://www.moore.com/ Fact Sheet for Healthcare Providers: https://www.young.biz/ This test is not yet approved or cleared by the [place] FDA and  has been authorized for detection and/or diagnosis of SARS-CoV-2 by  FDA under an Emergency Use Authorization (EUA). This EUA will remain  in effect (meaning this test can be used) for the duration of the  Covid-19 declaration under Section 564(b)(1) of the Act, 21  U.S.C. section 360bbb-3(b)(1), unless the authorization is  terminated or revoked. Performed at [facility], [address], [place], [place] [address]   MRSA PCR Screening     Status: None   Collection Time: 09/21/19  5:08 PM   Specimen: Nasal Mucosa; Nasopharyngeal  Result Value Ref Range Status   MRSA by PCR NEGATIVE NEGATIVE Final  Comment:        The GeneXpert MRSA Assay (FDA approved for NASAL specimens only), is one component of a comprehensive MRSA colonization surveillance program. It is not intended to diagnose MRSA infection nor to guide or monitor treatment for MRSA infections. Performed at [facility], [address] [place], [place] [address]          Radiology Studies: CT HEAD WO CONTRAST  Result Date: 09/21/2019 CLINICAL DATA:  Focal neuro deficit, greater than 6 hours, stroke suspected. Additional history provided: Loss of consciousness. EXAM: CT HEAD WITHOUT CONTRAST TECHNIQUE: Contiguous axial images were obtained from the base of the skull through the vertex without intravenous contrast. COMPARISON:  Head CT 09/01/2019, brain MRI 09/01/2019 FINDINGS: Brain: Stable ill-defined hypoattenuation within the cerebral white matter which is nonspecific, but consistent with chronic small vessel ischemic disease. Mild generalized parenchymal atrophy. There is no acute intracranial hemorrhage. No demarcated cortical infarct. No extra-axial  fluid collection. No evidence of intracranial mass. No midline shift. Vascular: No hyperdense vessel.  Atherosclerotic calcifications. Skull: Normal. Negative for fracture or focal lesion. Sinuses/Orbits: Visualized orbits show no acute finding. No significant paranasal sinus disease or mastoid effusion at the imaged levels. IMPRESSION: 1. No evidence of acute intracranial abnormality. 2. Stable generalized parenchymal atrophy and chronic small vessel ischemic disease. Electronically Signed   By: Jackey Loge DO   On: 09/21/2019 20:14   DG Chest Port 1 View  Addendum Date: 09/21/2019   ADDENDUM REPORT: 09/21/2019 14:59 ADDENDUM: These results were called by telephone at the time of interpretation on 09/21/2019 at 2:59 pm to provider [facility] , who verbally acknowledged these results. Electronically Signed   By: Jackey Loge DO   On: 09/21/2019 14:59   Result Date: 09/21/2019 CLINICAL DATA:  Weakness, altered LOC, family reports recent UTI EXAM: PORTABLE CHEST 1 VIEW COMPARISON:  Prior chest radiographs 09/01/2019 and earlier FINDINGS: Heart size within normal limits. Aortic atherosclerosis. Opacity within the right mid lung suspicious for pneumonia. A linear opacity projects over the right lung apex. No evidence of pleural effusion. No acute bony abnormality identified. IMPRESSION: Opacity within the right mid lung suspicious for pneumonia. Radiographic follow-up to resolution is recommended. A linear opacity projects over the right lung apex. Lung markings are seen beyond this point and this is favored to reflect a skin fold. However, a repeat radiograph is recommended to definitively exclude a right apical pneumothorax. Aortic Atherosclerosis (ICD10-I70.0). Electronically Signed: By: Jackey Loge DO On: 09/21/2019 14:50        Scheduled Meds: . amitriptyline  50 mg Oral QHS  . apixaban  2.5 mg Oral BID  . Chlorhexidine Gluconate Cloth  6 each Topical Daily  . hydrocortisone sod succinate  (SOLU-CORTEF) inj  50 mg Intravenous Q8H  . insulin aspart  0-5 Units Subcutaneous QHS  . insulin aspart  0-9 Units Subcutaneous TID WC  . levothyroxine  75 mcg Oral Q0600  . mouth rinse  15 mL Mouth Rinse BID  . multivitamin with minerals  1 tablet Oral Daily  . pantoprazole  40 mg Oral Daily  . predniSONE  3 mg Oral Daily   Continuous Infusions: . sodium chloride 50 mL/hr at 09/22/19 0700  . ceFEPime (MAXIPIME) IV    . vancomycin       LOS: 1 day   Time spent= 30 mins    Romey Cohea Joline Maxcy, MD [facility]  If 7PM-7AM, please contact night-coverage  09/22/2019, 7:28 AM

## 2019-09-22 NOTE — Progress Notes (Signed)
Patient arrives to room 1509 at this time from ICU via bed.  Patient assisted to bed with assist of 3.  Siderails up x3, callbell in reach and bed low.  Patient in belt restraint with floor mats in place.

## 2019-09-22 NOTE — Progress Notes (Signed)
Updated daughter that pt had been transferred to [phone]

## 2019-09-22 NOTE — TOC Initial Note (Signed)
Transition of Care [facility]) - Initial/Assessment Note    Patient Details  Name: Jenna Carrillo MRN: [id number] Date of Birth: [birth year]  Transition of Care [facility]) CM/SW Contact:    Leeroy Cha, RN Phone Number: 09/22/2019, 12:54 PM  Clinical Narrative:                 Slightly confused-septic/iv maxipime,ammonia level elevated at 40,bld cultures x2 x<24hr neg, hx of copd and gerd, lives with the daughter.  Expected Discharge Plan: Home/Self Care Barriers to Discharge: Continued Medical Work up   Patient Goals and CMS Choice Patient states their goals for this hospitalization and ongoing recovery are:: i want to go home CMS Medicare.gov Compare Post Acute Care list provided to:: Patient Represenative (must comment) Choice offered to / list presented to : Adult Children  Expected Discharge Plan and Services Expected Discharge Plan: Home/Self Care       Living arrangements for the past 2 months: Single Family Home                                      Prior Living Arrangements/Services Living arrangements for the past 2 months: Single Family Home Lives with:: Adult Children(lives with daughter) Patient language and need for interpreter reviewed:: No Do you feel safe going back to the place where you live?: Yes      Need for Family Participation in Patient Care: Yes (Comment) Care giver support system in place?: Yes (comment)   Criminal Activity/Legal Involvement Pertinent to Current Situation/Hospitalization: No - Comment as needed  Activities of Daily Living Home Assistive Devices/Equipment: Hand-held shower hose, Shower chair with back, Dentures (specify type), Walker (specify type), Eyeglasses(upper/lower dentures, single point cane, 4 wheeled walker) ADL Screening (condition at time of admission) Patient's cognitive ability adequate to safely complete daily activities?: No Is the patient deaf or have difficulty hearing?: No Does the patient have difficulty  seeing, even when wearing glasses/contacts?: No Does the patient have difficulty concentrating, remembering, or making decisions?: Yes Patient able to express need for assistance with ADLs?: Yes Does the patient have difficulty dressing or bathing?: Yes Independently performs ADLs?: No Communication: Independent Dressing (OT): Needs assistance Is this a change from baseline?: Pre-admission baseline Grooming: Independent Feeding: Independent Bathing: Needs assistance Is this a change from baseline?: Pre-admission baseline Toileting: Needs assistance Is this a change from baseline?: Pre-admission baseline In/Out Bed: Needs assistance Is this a change from baseline?: Pre-admission baseline Walks in Home: Dependent Is this a change from baseline?: Pre-admission baseline Does the patient have difficulty walking or climbing stairs?: Yes Weakness of Legs: Both Weakness of Arms/Hands: None  Permission Sought/Granted                  Emotional Assessment Appearance:: Appears stated age Attitude/Demeanor/Rapport: Engaged Affect (typically observed): Calm Orientation: : Fluctuating Orientation (Suspected and/or reported Sundowners) Alcohol / Substance Use: Not Applicable Psych Involvement: No (comment)  Admission diagnosis:  Healthcare-associated pneumonia [J18.9] NSTEMI (non-ST elevated myocardial infarction) ([place]) [I21.4] AKI (acute kidney injury) ([place]) [N17.9] Sepsis secondary to UTI ([place]) [A41.9, N39.0] Sepsis, due to unspecified organism, unspecified whether acute organ dysfunction present [facility]) [A41.9] Patient Active Problem List   Diagnosis Date Noted  . Sepsis secondary to UTI ([place]) 09/21/2019  . Proteus mirabilis infection 09/18/2019  . Acute lower UTI 09/18/2019  . Metabolic encephalopathy 05/27/3233  . Acute metabolic encephalopathy 57/32/2025  . Leukocytosis 09/01/2019  . Acute  systolic heart failure (HCC) 01/28/2019  . Atrial fibrillation (HCC)   . Current  chronic use of systemic steroids 01/25/2019  . Encephalopathy 01/25/2019  . Fall from slip, trip, or stumble, initial encounter   . Generalized weakness   . Sepsis due to pneumonia (HCC) 01/24/2019  . Bronchiectasis with (acute) exacerbation (HCC) 12/20/2018  . Lobar pneumonia, unspecified organism (HCC) 02/15/2018  . Nuclear sclerosis of both eyes 03/05/2016  . Glaucoma suspect, bilateral 03/05/2016  . CAP (community acquired pneumonia) 11/12/2015  . Pulmonary nodules 10/12/2015  . Cough 02/03/2014  . Macular degeneration, left eye 12/28/2013  . Glaucoma suspect, both eyes 12/28/2013  . Allergic rhinitis 11/12/2012  . Nuclear cataract 08/08/2011  . BRONCHIECTASIS 03/09/2009  . COPD (chronic obstructive pulmonary disease) (HCC) 03/09/2009  . Essential hypertension 01/31/2009  . Polymyalgia rheumatica (HCC) 01/31/2009   PCP:  Creola Corn, MD Pharmacy:   [facility] [address] - [place], [place] - [phone] [place] [address] Phone: [phone] Fax: [phone]  [id number] - [place], [place] - [address] [place] [address] Phone: [phone] Fax: [phone]     Social Determinants of Health (SDOH) Interventions    Readmission Risk Interventions No flowsheet data found.

## 2019-09-22 NOTE — Progress Notes (Signed)
Recheck of blood sugar after orange juice was 89, will continue to monitor

## 2019-09-22 NOTE — Progress Notes (Signed)
Pharmacy Antibiotic Note  Jenna Carrillo is a 86 y.o. female admitted on 09/21/2019 with UTI and pneumonia.  Pharmacy has been consulted for vancomycin dosing.  09/22/2019  DC vancomycin w/ neg MRSA PCR SCr 1.61>>1.61, CrCl ~ 30 ml/min AF, WBC 23.9 (on steroids)  Plan: Increase to Cefepime 2 g IV q12 for CrCl 30 ml/min for HCAP coverage Pharmacy to sign off   Height: 5\' 4"  (162.6 cm) Weight: 51.7 kg (113 lb 15.7 oz) IBW/kg (Calculated) : 54.7  Temp (24hrs), Avg:97.8 F (36.6 C), Min:97.4 F (36.3 C), Max:98.3 F (36.8 C)  Recent Labs  Lab 09/21/19 1421 09/21/19 1556 09/22/19 0216  WBC 22.8*  --  23.9*  CREATININE 1.61*  --  1.10*  LATICACIDVEN 2.4* 1.7  --     Estimated Creatinine Clearance: 30 mL/min (A) (by C-G formula based on SCr of 1.1 mg/dL (H)).    Allergies  Allergen Reactions  . Sulfonamide Derivatives Rash    Antimicrobials this admission: 5/5 vanc >> 5/6 5/5 cefepime >>  Dose adjustments this admission:  5/6 increase cefepime to 2 gm q12 for HCAP & CrCl 30 Microbiology results: 5/5 BCx: ngtd 5/5 UCx: unable to provide sample- incontinent, don't want foley, not allowing purewick,  5/5 MRSA PCR: neg  Thank you for allowing pharmacy to be a part of this patient's care.  11/22/19, Pharm.D 09/22/2019 11:38 AM

## 2019-09-23 ENCOUNTER — Inpatient Hospital Stay (HOSPITAL_COMMUNITY): Payer: Medicare Other

## 2019-09-23 DIAGNOSIS — G9341 Metabolic encephalopathy: Secondary | ICD-10-CM

## 2019-09-23 DIAGNOSIS — D72825 Bandemia: Secondary | ICD-10-CM

## 2019-09-23 DIAGNOSIS — I351 Nonrheumatic aortic (valve) insufficiency: Secondary | ICD-10-CM

## 2019-09-23 DIAGNOSIS — N179 Acute kidney failure, unspecified: Secondary | ICD-10-CM

## 2019-09-23 DIAGNOSIS — I5032 Chronic diastolic (congestive) heart failure: Secondary | ICD-10-CM

## 2019-09-23 DIAGNOSIS — I340 Nonrheumatic mitral (valve) insufficiency: Secondary | ICD-10-CM

## 2019-09-23 DIAGNOSIS — R748 Abnormal levels of other serum enzymes: Secondary | ICD-10-CM

## 2019-09-23 DIAGNOSIS — J189 Pneumonia, unspecified organism: Secondary | ICD-10-CM

## 2019-09-23 DIAGNOSIS — I480 Paroxysmal atrial fibrillation: Secondary | ICD-10-CM

## 2019-09-23 DIAGNOSIS — E872 Acidosis: Secondary | ICD-10-CM

## 2019-09-23 DIAGNOSIS — J439 Emphysema, unspecified: Secondary | ICD-10-CM

## 2019-09-23 DIAGNOSIS — I10 Essential (primary) hypertension: Secondary | ICD-10-CM

## 2019-09-23 DIAGNOSIS — J181 Lobar pneumonia, unspecified organism: Secondary | ICD-10-CM

## 2019-09-23 LAB — COMPREHENSIVE METABOLIC PANEL
ALT: 70 U/L — ABNORMAL HIGH (ref 0–44)
AST: 64 U/L — ABNORMAL HIGH (ref 15–41)
Albumin: 2.7 g/dL — ABNORMAL LOW (ref 3.5–5.0)
Alkaline Phosphatase: 101 U/L (ref 38–126)
Anion gap: 7 (ref 5–15)
BUN: 28 mg/dL — ABNORMAL HIGH (ref 8–23)
CO2: 26 mmol/L (ref 22–32)
Calcium: 8.4 mg/dL — ABNORMAL LOW (ref 8.9–10.3)
Chloride: 105 mmol/L (ref 98–111)
Creatinine, Ser: 1.06 mg/dL — ABNORMAL HIGH (ref 0.44–1.00)
GFR calc Af Amer: 55 mL/min — ABNORMAL LOW (ref >60)
GFR calc non Af Amer: 48 mL/min — ABNORMAL LOW (ref >60)
Glucose, Bld: 93 mg/dL (ref 70–99)
Potassium: 3.8 mmol/L (ref 3.5–5.1)
Sodium: 138 mmol/L (ref 135–145)
Total Bilirubin: 0.8 mg/dL (ref 0.3–1.2)
Total Protein: 5.6 g/dL — ABNORMAL LOW (ref 6.5–8.1)

## 2019-09-23 LAB — URINE CULTURE: Culture: NO GROWTH

## 2019-09-23 LAB — ECHOCARDIOGRAM COMPLETE
Height: 64 in
Weight: 1918.88 oz

## 2019-09-23 LAB — CBC
HCT: 31.4 % — ABNORMAL LOW (ref 36.0–46.0)
Hemoglobin: 9.8 g/dL — ABNORMAL LOW (ref 12.0–15.0)
MCH: 31 pg (ref 26.0–34.0)
MCHC: 31.2 g/dL (ref 30.0–36.0)
MCV: 99.4 fL (ref 80.0–100.0)
Platelets: 173 10*3/uL (ref 150–400)
RBC: 3.16 MIL/uL — ABNORMAL LOW (ref 3.87–5.11)
RDW: 13.8 % (ref 11.5–15.5)
WBC: 16.3 10*3/uL — ABNORMAL HIGH (ref 4.0–10.5)
nRBC: 0 % (ref 0.0–0.2)

## 2019-09-23 LAB — CK: Total CK: 80 U/L (ref 38–234)

## 2019-09-23 LAB — GLUCOSE, CAPILLARY
Glucose-Capillary: 74 mg/dL (ref 70–99)
Glucose-Capillary: 110 mg/dL — ABNORMAL HIGH (ref 70–99)
Glucose-Capillary: 114 mg/dL — ABNORMAL HIGH (ref 70–99)

## 2019-09-23 LAB — MAGNESIUM: Magnesium: 2.1 mg/dL (ref 1.7–2.4)

## 2019-09-23 MED ORDER — HYDRALAZINE HCL 25 MG PO TABS: 25.0000 mg | ORAL_TABLET | Freq: Three times a day (TID) | ORAL | Status: DC | PRN

## 2019-09-23 MED ORDER — FUROSEMIDE 20 MG PO TABS
20.0000 mg | ORAL_TABLET | Freq: Every day | ORAL | Status: DC
  Administered 2019-09-23 – 2019-09-24 (×2): 20 mg via ORAL
  Filled 2019-09-23 (×2): qty 1

## 2019-09-23 MED ORDER — CEPHALEXIN 500 MG PO CAPS: 500.0000 mg | ORAL_CAPSULE | Freq: Three times a day (TID) | ORAL | Status: DC

## 2019-09-23 MED ORDER — CEPHALEXIN 500 MG PO CAPS
500.0000 mg | ORAL_CAPSULE | Freq: Three times a day (TID) | ORAL | Status: DC
  Administered 2019-09-23 – 2019-09-24 (×3): 500 mg via ORAL
  Filled 2019-09-23 (×3): qty 1

## 2019-09-23 NOTE — Progress Notes (Signed)
Pt in Yellow MEWS d/t increased RR: 28. Pt had c/o chest tightness and having trouble breathing. Upon auscultation pt had crackles present. Charge RN notified to look at pt. MD notified and placed new orders for a Chest X-ray and Echocardiogram. 2L O2 via [place] placed on pt for SPO2 of 88%. Yellow MEWS initiated. Will continue to monitor closely.

## 2019-09-23 NOTE — Progress Notes (Signed)
PROGRESS NOTE  RENEISHA STILLEY Carrillo:[id number] DOB: [birth year]   PCP: Creola Corn, MD  Patient is from: home. At baseline follows commands and ambulates with cane but does not participate in routineconversation.  DOA: 09/21/2019 LOS: 2  Brief Narrative / Interim history: 86 year old female with history of COPD, PMR on prednisone, recurrent UTIs, dementia, hypothyroidism, HTN, HLD and GERD brought to ED due to altered mental status.  Diagnosed with UTI, "HCAP", elevated troponin, elevated BNP and AKI.  CT head negative for acute finding.  Received stress dose steroid due to hypotension.  Cardiology consulted and recommended conservative management and echocardiogram for elevated troponin/BNP.  Subjective: Seen and examined earlier this morning.  No major events overnight.  Brief transient respiratory distress with tachypnea per RN this morning.  CXR without significant finding to explain this.  Currently saturating well without respiratory distress on 2 L by [place].  No complaints but not a reliable historian.  She is awake and alert but oriented to self and place only.  Objective: Vitals:   09/23/19 1050 09/23/19 1228 09/23/19 1335 09/23/19 1633  BP:  (!) 177/71 (!) 154/71 (!) 164/75  Pulse:  97 94 88  Resp:  20 (!) 22 20  Temp:  97.9 F (36.6 C) 97.8 F (36.6 C) 98.7 F (37.1 C)  TempSrc:  Oral Oral   SpO2: 98% 100% 99% 100%  Weight:      Height:        Intake/Output Summary (Last 24 hours) at 09/23/2019 1709 Last data filed at 09/23/2019 0500 Gross per 24 hour  Intake 236 ml  Output 225 ml  Net 11 ml   Filed Weights   09/21/19 1707 09/22/19 0500 09/23/19 0500  Weight: 46.5 kg 51.7 kg 54.4 kg    Examination:  GENERAL: No apparent distress.  Nontoxic. HEENT: MMM.  Vision and hearing grossly intact.  NECK: Supple.  No apparent JVD.  RESP: 98% on 2 L by [place] no IWOB.  Fair aeration with bilateral crackles CVS:  RRR. Heart sounds normal.  ABD/GI/GU: Bowel sounds present. Soft.  Non tender.  MSK/EXT:  Moves extremities. No apparent deformity. No edema.  SKIN: no apparent skin lesion or wound NEURO: Awake, alert and oriented to self and place only.  No apparent focal neuro deficit. PSYCH: Calm. Normal affect.   Procedures:  None  Microbiology summarized: 5/5-influenza PCR negative. 5/5-COVID-19 PCR negative. 5/5-MRSA PCR negative. 5/5-blood cultures NGTD 5/6-urine culture not obtained due to incontinence.  Assessment & Plan: Acute metabolic encephalopathy-multifactorial including sepsis due to UTI, pneumonia, non-STEMI, possible CHF exacerbation and dementia.  Oriented to self and place.  No behavioral disturbance.  CT head negative.  Likely at baseline. -Treat treatable causes as below -Frequent reorientation, delirium and fall precautions  Sepsis due to UTI and RLL/RML pneumonia-blood cultures negative.  Not possible to obtain urine culture due to incontinence.  Doubt utility at this time.  CXR with RLL and RML opacity concerning for pneumonia.  Procalcitonin elevated.  Sepsis physiology resolving except for leukocytosis which is improving. -Vancomycin 5/5-5/6.  MRSA PCR negative. -Cefepime 5/5-5/7.  Switch and discharge on Keflex  Elevated troponin/demand ischemia versus non-STEMI: HS troponin 1220> 1088.  EKG without signs of acute ischemic finding.  Patient without chest pain. -Cardiology recommended conservative measures and checking echo -Echocardiogram pending. -Continue home carvedilol  Chronic diastolic CHF: elevated BNP to 8119 but lower than previous value in 01/2019.  Has bilateral crackles but no clinical signs of fluid overload. Echo in 12/20 with EF of  65 to 70%, G2 DD. -Check echocardiogram -Resume home Lasix. -Monitor fluid status, renal function and electrolytes.  Paroxysmal A. fib: Rate controlled on carvedilol. -Continue home Coreg and Eliquis  AKI: b/l Cr 1.0> 1.6 (admit)> 1.06 -Continue monitoring  Lactic acidosis:  Resolved.  Elevated liver enzymes: Likely due to sepsis.  CK within normal.  Improved. -Continue monitoring  Chronic COPD-stable. -Resume home meds  Leukocytosis/bandemia: Could be due to sepsis and steroid.  Improving. -Continue monitoring  PMR -Continue home prednisone  Vitamin D insufficiency -Supplement  Paroxysmal atrial fibrillation Resume home Eliquis.  History of COPD Bronchodilators.  Polymyalgia rheumatica Daily chronic prednisone  Hypothyroidism TSH normal, continue Synthroid  Advanced dementia/mood disorder: Stable -Continue home medications            DVT prophylaxis: On Eliquis for A. fib Code Status: DNR/DNI Family Communication: Updated patient's daughter over the phone. Status is: Inpatient  Remains inpatient appropriate because:Ongoing diagnostic testing needed not appropriate for outpatient work up, Unsafe d/c plan and Inpatient level of care appropriate due to severity of illness   Dispo: The patient is from: Home              Anticipated d/c is to: To be determined              Anticipated d/c date is: 1 day              Patient currently is not medically stable to d/c.        Consultants:  Cardiology over the phone   Sch Meds:  Scheduled Meds: . amitriptyline  50 mg Oral QHS  . apixaban  2.5 mg Oral BID  . carvedilol  12.5 mg Oral BID WC  . cephALEXin  500 mg Oral Q8H  . cholecalciferol  1,000 Units Oral Daily  . furosemide  20 mg Oral Daily  . levothyroxine  75 mcg Oral Q0600  . mouth rinse  15 mL Mouth Rinse BID  . multivitamin with minerals  1 tablet Oral Daily  . pantoprazole  40 mg Oral Daily  . predniSONE  3 mg Oral Daily  . QUEtiapine  25 mg Oral QHS   Continuous Infusions:  PRN Meds:.bisacodyl, hydrALAZINE, ondansetron **OR** ondansetron (ZOFRAN) IV, polyethylene glycol, senna-docusate  Antimicrobials: Anti-infectives (From admission, onward)   Start     Dose/Rate Route Frequency Ordered Stop    09/23/19 1715  cephALEXin (KEFLEX) capsule 500 mg  Status:  Discontinued     500 mg Oral Every 8 hours 09/23/19 1705 09/23/19 1709   09/23/19 1715  cephALEXin (KEFLEX) capsule 500 mg     500 mg Oral Every 8 hours 09/23/19 1709 09/27/19 1359   09/22/19 2200  vancomycin (VANCOREADY) IVPB 500 mg/100 mL  Status:  Discontinued     500 mg 100 mL/hr over 60 Minutes Intravenous Every 36 hours 09/21/19 2038 09/22/19 1115   09/22/19 1400  ceFEPIme (MAXIPIME) 2 g in sodium chloride 0.9 % 100 mL IVPB  Status:  Discontinued     2 g 200 mL/hr over 30 Minutes Intravenous Every 24 hours 09/21/19 2038 09/22/19 1134   09/22/19 1200  ceFEPIme (MAXIPIME) 2 g in sodium chloride 0.9 % 100 mL IVPB  Status:  Discontinued     2 g 200 mL/hr over 30 Minutes Intravenous Every 12 hours 09/22/19 1134 09/23/19 1705   09/21/19 1515  vancomycin (VANCOCIN) IVPB 1000 mg/200 mL premix     1,000 mg 200 mL/hr over 60 Minutes Intravenous  Once 09/21/19 1503 09/21/19  1641   09/21/19 1500  ceFEPIme (MAXIPIME) 2 g in sodium chloride 0.9 % 100 mL IVPB     2 g 200 mL/hr over 30 Minutes Intravenous  Once 09/21/19 1456 09/21/19 1725       I have personally reviewed the following labs and images: CBC: Recent Labs  Lab 09/21/19 1421 09/21/19 1846 09/22/19 0216 09/23/19 0529  WBC 22.8*  --  23.9* 16.3*  NEUTROABS 21.2*  --   --   --   HGB 10.5*  --  9.8* 9.8*  HCT 33.7* 33.7* 33.2* 31.4*  MCV 100.6*  --  104.4* 99.4  PLT 202  --  150 173   BMP &GFR Recent Labs  Lab 09/21/19 1421 09/22/19 0216 09/23/19 0529  NA 141 139 138  K 4.1 4.2 3.8  CL 105 106 105  CO2 29 23 26   GLUCOSE 96 95 93  BUN 22 21 28*  CREATININE 1.61* 1.10* 1.06*  CALCIUM 8.0* 8.1* 8.4*  MG  --  1.8 2.1   Estimated Creatinine Clearance: 32.7 mL/min (A) (by C-G formula based on SCr of 1.06 mg/dL (H)). Liver & Pancreas: Recent Labs  Lab 09/21/19 1421 09/22/19 0216 09/23/19 0529  AST 105* 117* 64*  ALT 65* 85* 70*  ALKPHOS 115 106 101   BILITOT 0.8 1.6* 0.8  PROT 5.7* 5.4* 5.6*  ALBUMIN 2.8* 2.6* 2.7*   No results for input(s): LIPASE, AMYLASE in the last 168 hours. Recent Labs  Lab 09/21/19 1845  AMMONIA 40*   Diabetic: Recent Labs    09/21/19 1846  HGBA1C 5.6   Recent Labs  Lab 09/22/19 1700 09/22/19 2143 09/23/19 0739 09/23/19 1331 09/23/19 1633  GLUCAP 123* 104* 74 110* 114*   Cardiac Enzymes: Recent Labs  Lab 09/23/19 0529  CKTOTAL 80   No results for input(s): PROBNP in the last 8760 hours. Coagulation Profile: Recent Labs  Lab 09/21/19 1421  INR 1.8*   Thyroid Function Tests: Recent Labs    09/21/19 1735  TSH 2.439   Lipid Profile: No results for input(s): CHOL, HDL, LDLCALC, TRIG, CHOLHDL, LDLDIRECT in the last 72 hours. Anemia Panel: Recent Labs    09/21/19 1735  VITAMINB12 490   Urine analysis:    Component Value Date/Time   COLORURINE YELLOW 09/22/2019 1600   APPEARANCEUR HAZY (A) 09/22/2019 1600   LABSPEC 1.012 09/22/2019 1600   PHURINE 5.0 09/22/2019 1600   GLUCOSEU NEGATIVE 09/22/2019 1600   HGBUR MODERATE (A) 09/22/2019 1600   BILIRUBINUR NEGATIVE 09/22/2019 1600   KETONESUR NEGATIVE 09/22/2019 1600   PROTEINUR NEGATIVE 09/22/2019 1600   NITRITE NEGATIVE 09/22/2019 1600   LEUKOCYTESUR NEGATIVE 09/22/2019 1600   Sepsis Labs: Invalid input(s): PROCALCITONIN, LACTICIDVEN  Microbiology: Recent Results (from the past 240 hour(s))  Culture, blood (routine x 2)     Status: None (Preliminary result)   Collection Time: 09/21/19  2:06 PM   Specimen: BLOOD  Result Value Ref Range Status   Specimen Description   Final    BLOOD RIGHT ANTECUBITAL Performed at [facility], [address], [place], [place]    Special Requests   Final    BOTTLES DRAWN AEROBIC AND ANAEROBIC Blood Culture adequate volume Performed at [facility], [address], [place], [place]    Culture   Final    NO GROWTH 2  DAYS Performed at [facility], [address], [place], [place]    Report Status PENDING  Incomplete  Culture, blood (routine x 2)     Status: None (Preliminary result)   Collection Time: 09/21/19  2:31 PM   Specimen: BLOOD LEFT FOREARM  Result Value Ref Range Status   Specimen Description   Final    BLOOD LEFT FOREARM Performed at [facility], [address], [place], [place] [address]    Special Requests   Final    BOTTLES DRAWN AEROBIC AND ANAEROBIC Blood Culture results may not be optimal due to an inadequate volume of blood received in culture bottles Performed at [facility], [address] [place], [place] [address]    Culture   Final    NO GROWTH 2 DAYS Performed at [facility], [address], [place], [place] [address]    Report Status PENDING  Incomplete  Respiratory Panel by RT PCR (Flu A&B, Covid) - Nasopharyngeal Swab     Status: None   Collection Time: 09/21/19  3:56 PM   Specimen: Nasopharyngeal Swab  Result Value Ref Range Status   SARS Coronavirus 2 by RT PCR NEGATIVE NEGATIVE Final    Comment: (NOTE) SARS-CoV-2 target nucleic acids are NOT DETECTED. The SARS-CoV-2 RNA is generally detectable in upper respiratoy specimens during the acute phase of infection. The lowest concentration of SARS-CoV-2 viral copies this assay can detect is 131 copies/mL. A negative result does not preclude SARS-Cov-2 infection and should not be used as the sole basis for treatment or other patient management decisions. A negative result may occur with  improper specimen collection/handling, submission of specimen other than nasopharyngeal swab, presence of viral mutation(s) within the areas targeted by this assay, and inadequate number of viral copies (<131 copies/mL). A negative result must be combined with clinical observations, patient history, and epidemiological information. The expected result is Negative. Fact  Sheet for Patients:  https://www.moore.com/ Fact Sheet for Healthcare Providers:  https://www.young.biz/ This test is not yet ap proved or cleared by the [place] FDA and  has been authorized for detection and/or diagnosis of SARS-CoV-2 by FDA under an Emergency Use Authorization (EUA). This EUA will remain  in effect (meaning this test can be used) for the duration of the COVID-19 declaration under Section 564(b)(1) of the Act, 21 U.S.C. section 360bbb-3(b)(1), unless the authorization is terminated or revoked sooner.    Influenza A by PCR NEGATIVE NEGATIVE Final   Influenza B by PCR NEGATIVE NEGATIVE Final    Comment: (NOTE) The Xpert Xpress SARS-CoV-2/FLU/RSV assay is intended as an aid in  the diagnosis of influenza from Nasopharyngeal swab specimens and  should not be used as a sole basis for treatment. Nasal washings and  aspirates are unacceptable for Xpert Xpress SARS-CoV-2/FLU/RSV  testing. Fact Sheet for Patients: https://www.moore.com/ Fact Sheet for Healthcare Providers: https://www.young.biz/ This test is not yet approved or cleared by the [place] FDA and  has been authorized for detection and/or diagnosis of SARS-CoV-2 by  FDA under an Emergency Use Authorization (EUA). This EUA will remain  in effect (meaning this test can be used) for the duration of the  Covid-19 declaration under Section 564(b)(1) of the Act, 21  U.S.C. section 360bbb-3(b)(1), unless the authorization is  terminated or revoked. Performed at [facility], [address] [place], [place] [address]   MRSA PCR Screening     Status: None   Collection Time: 09/21/19  5:08 PM   Specimen: Nasal Mucosa; Nasopharyngeal  Result Value Ref Range Status   MRSA by PCR NEGATIVE NEGATIVE Final  Comment:        The GeneXpert MRSA Assay (FDA approved for NASAL specimens only), is one component of  a comprehensive MRSA colonization surveillance program. It is not intended to diagnose MRSA infection nor to guide or monitor treatment for MRSA infections. Performed at [facility], [address] [place], [place] [address]     Radiology Studies: [facility] Chest Port 1 View  Result Date: 09/23/2019 CLINICAL DATA:  Shortness of breath. EXAM: PORTABLE CHEST 1 VIEW COMPARISON:  09/21/2019.  09/01/2019.  02/15/2018. FINDINGS: Mediastinum hilar structures normal. Heart size normal. Diffuse bilateral interstitial prominence again noted without interim change consistent chronic interstitial lung disease. Calcified pulmonary nodule right upper lung consistent with granuloma. Mild bibasilar atelectasis. Tiny bilateral pleural effusions cannot be excluded. No pneumothorax. Surgical clips right neck. IMPRESSION: 1. Low lung volumes with mild bibasilar atelectasis. Tiny bilateral pleural effusions cannot be excluded. 2. Diffuse bilateral interstitial prominence again noted without interim change consistent chronic interstitial lung disease. Electronically Signed   By: Maisie Fus  Register   On: 09/23/2019 09:50   ECHOCARDIOGRAM COMPLETE  Result Date: 09/23/2019    ECHOCARDIOGRAM REPORT   Patient Name:   Jenna Carrillo [facility] Date of Exam: 09/23/2019 Medical Rec #:  [id number]       Height:       64.0 in Accession #:    [id number]      Weight:       119.9 lb Date of Birth:  1931/01/15      BSA:          1.574 m Patient Age:    86 years        BP:           154/71 mmHg Patient Gender: F               HR:           91 bpm. Exam Location:  Inpatient Procedure: 2D Echo Indications:    Elevated Troponin  History:        Patient has prior history of Echocardiogram examinations, most                 recent 04/26/2019. CHF, COPD, Arrythmias:Atrial Fibrillation;                 Risk Factors:Hypertension. Sepsis.  Sonographer:    Leeroy Bock Turrentine Referring Phys: [id number] Boyce Medici GONFA IMPRESSIONS  1. Left ventricular  ejection fraction, by estimation, is 50 to 55%. The left ventricle has low normal function. The left ventricle demonstrates regional wall motion abnormalities (see scoring diagram/findings for description). Left ventricular diastolic  parameters are consistent with Grade II diastolic dysfunction (pseudonormalization). There is mild hypokinesis of the left ventricular, basal-mid anteroseptal wall and inferoseptal wall.  2. Right ventricular systolic function is normal. The right ventricular size is normal. There is moderately elevated pulmonary artery systolic pressure.  3. Left atrial size was mildly dilated.  4. The mitral valve is degenerative. Mild mitral valve regurgitation. No evidence of mitral stenosis.  5. The aortic valve is tricuspid. Aortic valve regurgitation is mild. Mild aortic valve sclerosis is present, with no evidence of aortic valve stenosis.  6. The inferior vena cava is normal in size with greater than 50% respiratory variability, suggesting right atrial pressure of 3 mmHg. FINDINGS  Left Ventricle: Left ventricular ejection fraction, by estimation, is 50 to 55%. The left ventricle has low normal function. The left ventricle demonstrates regional wall motion abnormalities. Mild hypokinesis of the  left ventricular, basal-mid anteroseptal wall and inferoseptal wall. The left ventricular internal cavity size was normal in size. There is no left ventricular hypertrophy. Left ventricular diastolic parameters are consistent with Grade II diastolic dysfunction (pseudonormalization). Right Ventricle: The right ventricular size is normal. No increase in right ventricular wall thickness. Right ventricular systolic function is normal. There is moderately elevated pulmonary artery systolic pressure. The tricuspid regurgitant velocity is 3.57 m/s, and with an assumed right atrial pressure of 3 mmHg, the estimated right ventricular systolic pressure is 54.0 mmHg. Left Atrium: Left atrial size was mildly  dilated. Right Atrium: Right atrial size was normal in size. Pericardium: Trivial pericardial effusion is present. Mitral Valve: The mitral valve is degenerative in appearance. There is mild thickening of the mitral valve leaflet(s). Severe mitral annular calcification. Mild mitral valve regurgitation. No evidence of mitral valve stenosis. Tricuspid Valve: The tricuspid valve is normal in structure. Tricuspid valve regurgitation is mild . No evidence of tricuspid stenosis. Aortic Valve: The aortic valve is tricuspid. . There is mild thickening and mild calcification of the aortic valve. Aortic valve regurgitation is mild. Mild aortic valve sclerosis is present, with no evidence of aortic valve stenosis. There is mild thickening of the aortic valve. There is mild calcification of the aortic valve. Pulmonic Valve: The pulmonic valve was not well visualized. Pulmonic valve regurgitation is not visualized. No evidence of pulmonic stenosis. Aorta: The aortic root is normal in size and structure. Venous: The inferior vena cava is normal in size with greater than 50% respiratory variability, suggesting right atrial pressure of 3 mmHg. IAS/Shunts: No atrial level shunt detected by color flow Doppler.  LEFT VENTRICLE PLAX 2D LVIDd:         4.60 cm  Diastology LVIDs:         3.40 cm  LV e' lateral:   10.20 cm/s LV PW:         0.90 cm  LV E/e' lateral: 15.7 LV IVS:        0.90 cm  LV e' medial:    6.60 cm/s LVOT diam:     1.60 cm  LV E/e' medial:  24.3 LV SV:         33 LV SV Index:   21 LVOT Area:     2.01 cm  RIGHT VENTRICLE RV S prime:     10.90 cm/s TAPSE (M-mode): 1.8 cm LEFT ATRIUM             Index       RIGHT ATRIUM           Index LA diam:        3.70 cm 2.35 cm/m  RA Area:     10.10 cm LA Vol (A2C):   36.4 ml 23.12 ml/m RA Volume:   20.30 ml  12.90 ml/m LA Vol (A4C):   41.3 ml 26.24 ml/m LA Biplane Vol: 39.1 ml 24.84 ml/m  AORTIC VALVE LVOT Vmax:   85.50 cm/s LVOT Vmean:  62.600 cm/s LVOT VTI:    0.164 m   AORTA Ao Root diam: 2.40 cm MITRAL VALVE                TRICUSPID VALVE MV Area (PHT): 4.10 cm     TR Peak grad:   51.0 mmHg MV Decel Time: 185 msec     TR Vmax:        357.00 cm/s MV E velocity: 160.50 cm/s MV A velocity: 145.00 cm/s  SHUNTS MV E/A ratio:  1.11         Systemic VTI:  0.16 m                             Systemic Diam: 1.60 cm Buford Dresser MD Electronically signed by Buford Dresser MD Signature Date/Time: 09/23/2019/4:49:00 PM    Final     45 minutes with more than 50% spent in reviewing records, counseling patient/family and coordinating care.   Taye T. Smithton  If 7PM-7AM, please contact night-coverage [email or web address] Password [facility] 09/23/2019, 5:09 PM

## 2019-09-23 NOTE — TOC Progression Note (Addendum)
Transition of Care [facility]) - Progression Note    Patient Details  Name: Jenna Carrillo MRN: [id number] Date of Birth: 01-20-1931  Transition of Care [facility] CM/SW [place], [place] Phone Number: 09/23/2019, 2:32 PM  Clinical Narrative:   Met with patient and daughter at bedside in follow up to PT recommendation of [facility].  Patient is pleasantly confused, and daughter is provider of information.  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  DME includes walker, BSC and shower chair. Furthermore, they have a PCS coming into the home for 3 hours a day, 5X a week.  They had [facility] for PT in the recent past, and are hoping they can be linked with them again.  Called [facility] to inquire about this. TOC will continue to follow during the course of hospitalization.  Addendum:  Accepted by [facility] for [facility]     Expected Discharge Plan: [place] Barriers to Discharge: No Barriers Identified  Expected Discharge Plan and Services Expected Discharge Plan: [place] arrangements for the past 2 months: Single Family Home                                       Social Determinants of Health (SDOH) Interventions    Readmission Risk Interventions No flowsheet data found.

## 2019-09-23 NOTE — Progress Notes (Signed)
  Echocardiogram 2D Echocardiogram has been performed.  Jenna Carrillo A Jenna Carrillo 09/23/2019, 11:55 AM

## 2019-09-23 NOTE — Care Management Important Message (Signed)
Important Message  Patient Details IM Letter given to [name] SW Case Manager to present to the Patient Name: DEONDREA AGUADO MRN: [id number] Date of Birth: [birth year]   Medicare Important Message Given:  Yes     [name] 09/23/2019, 11:16 AM

## 2019-09-23 NOTE — Evaluation (Signed)
Occupational Therapy Evaluation Patient Details Name: Jenna Carrillo MRN: [id number] DOB: [birth year] Today's Date: 09/23/2019    History of Present Illness Jenna Carrillo is a 86 y.o. female with PMH significant of COPD/GERD, polymyalgia rheumatica on prednisone, recurrent UTIs, dementia, hypothyroidism, HTN, HLD. Pt brought to the hospital from home for evaluation of altered mental status.  Patient was admitted ~ one month ago for AMS secondary to pneumonia and UTI, treated and was discharged home.  Over the last couple of days family has noted that patient has been more confused, poor appetite.  On 09/21/19 they noted her to be hypoxic and hypotensive therefore called EMS who brought her to the hospital.   Clinical Impression   Jenna Carrillo is an 86 year old woman admitted to hospital with sepsis secondary to UTI. On evaluation patient presents with functional ROM and strength of upper extremity and ability to perform functional mobility with hand hold assist. Patient able to perform ADLs with supervision and verbal cues for safety and sequencing as needed. Patient appears to be at baseline in regards to functional mobilities - requiring 24/7 supervision at home due to cognitive impairment secondary to dementia.     Follow Up Recommendations  No OT follow up    Equipment Recommendations       Recommendations for Other Services  HH PT.     Precautions / Restrictions Precautions Precautions: Fall Restrictions Weight Bearing Restrictions: No      Mobility Bed Mobility                  Transfers                 General transfer comment: Patient seated in recliner with chair alarm when therapist entered room. Patient min guard - hand hold assist - for ambulation in room. Patient holding onto furniture as needed. No overt loss of balance noted.    Balance Overall balance assessment: Needs assistance;History of Falls Sitting-balance support: No upper extremity  supported;Feet supported Sitting balance-Leahy Scale: Good     Standing balance support: Single extremity supported Standing balance-Leahy Scale: Fair                             ADL either performed or assessed with clinical judgement   ADL Overall ADL's : Needs assistance/impaired Eating/Feeding: Set up   Grooming: Set up;Oral care;Wash/dry face;Standing;Cueing for sequencing   Upper Body Bathing: Set up;Cueing for sequencing   Lower Body Bathing: Set up;Sit to/from stand;Min guard;Cueing for safety;Cueing for sequencing   Upper Body Dressing : Set up;Sitting   Lower Body Dressing: Set up;Sit to/from stand;Min guard   Toilet Transfer: Min guard;Ambulation;Cueing for safety   Toileting- Clothing Manipulation and Hygiene: Min guard;Cueing for safety;Sit to/from stand       Functional mobility during ADLs: Min guard General ADL Comments: Overall ability to perform ADLs with setup, supervision or verbal cues for safety and quality of tasks.     Vision   Vision Assessment?: No apparent visual deficits     Perception     Praxis      Pertinent Vitals/Pain Pain Assessment: No/denies pain     Hand Dominance Right   Extremity/Trunk Assessment Upper Extremity Assessment Upper Extremity Assessment: Overall WFL for tasks assessed   Lower Extremity Assessment Lower Extremity Assessment: Defer to PT evaluation   Cervical / Trunk Assessment Cervical / Trunk Assessment: Normal   Communication Communication Communication: No difficulties  Cognition Arousal/Alertness: Awake/alert Behavior During Therapy: WFL for tasks assessed/performed Overall Cognitive Status: History of cognitive impairments - at baseline Area of Impairment: Memory;Following commands;Safety/judgement;Problem solving;Orientation                 Orientation Level: Disoriented to;Place;Time;Situation   Memory: Decreased short-term memory Following Commands: Follows one step  commands consistently Safety/Judgement: Decreased awareness of deficits;Decreased awareness of safety   Problem Solving: Requires verbal cues General Comments: Patient able to state name and birthdate today.   General Comments       Exercises     Shoulder Instructions      Home Living Family/patient expects to be discharged to:: Private residence Living Arrangements: Children(lives with daughter [name]; pt's son has MS and is bed bound) Available Help at Discharge: Family Type of Home: House       Home Layout: One level               Home Equipment: Walker - 4 wheels   Additional Comments: pt unable to provide history due to cognition. Environment from chart review/prior admission.      Prior Functioning/Environment Level of Independence: Independent with assistive device(s)        Comments: pt's family reports she ambulates wtih rollator at times but also with no device        OT Problem List: Impaired balance (sitting and/or standing)      OT Treatment/Interventions:      OT Goals(Current goals can be found in the care plan section)    OT Frequency:     Barriers to D/C:            Co-evaluation              AM-PAC OT "6 Clicks" Daily Activity     Outcome Measure Help from another person eating meals?: A Little Help from another person taking care of personal grooming?: A Little Help from another person toileting, which includes using toliet, bedpan, or urinal?: A Little Help from another person bathing (including washing, rinsing, drying)?: A Little Help from another person to put on and taking off regular upper body clothing?: A Little Help from another person to put on and taking off regular lower body clothing?: A Little 6 Click Score: 18   End of Session Equipment Utilized During Treatment: Gait belt Nurse Communication: Other (comment)(Reported to CNA that chair alarm had low battery.)  Activity Tolerance: Patient tolerated treatment  well Patient left: in chair;with chair alarm set;with call bell/phone within reach  OT Visit Diagnosis: History of falling (Z91.81);Unsteadiness on feet (R26.81)                Time: 1030-1043 OT Time Calculation (min): 13 min Charges:  OT General Charges $OT Visit: 1 Visit OT Evaluation $OT Eval Low Complexity: 1 Low  Jenna Carrillo, OTR/L Acute [facility] Rehab Services  Office [phone]   Kelli Churn 09/23/2019, 12:28 PM

## 2019-09-24 DIAGNOSIS — Z789 Other specified health status: Secondary | ICD-10-CM

## 2019-09-24 DIAGNOSIS — Z66 Do not resuscitate: Secondary | ICD-10-CM

## 2019-09-24 LAB — CBC
HCT: 33.2 % — ABNORMAL LOW (ref 36.0–46.0)
Hemoglobin: 10.6 g/dL — ABNORMAL LOW (ref 12.0–15.0)
MCH: 31.1 pg (ref 26.0–34.0)
MCHC: 31.9 g/dL (ref 30.0–36.0)
MCV: 97.4 fL (ref 80.0–100.0)
Platelets: 182 10*3/uL (ref 150–400)
RBC: 3.41 MIL/uL — ABNORMAL LOW (ref 3.87–5.11)
RDW: 13.7 % (ref 11.5–15.5)
WBC: 10.8 10*3/uL — ABNORMAL HIGH (ref 4.0–10.5)
nRBC: 0 % (ref 0.0–0.2)

## 2019-09-24 LAB — COMPREHENSIVE METABOLIC PANEL
ALT: 55 U/L — ABNORMAL HIGH (ref 0–44)
AST: 34 U/L (ref 15–41)
Albumin: 2.8 g/dL — ABNORMAL LOW (ref 3.5–5.0)
Alkaline Phosphatase: 102 U/L (ref 38–126)
Anion gap: 9 (ref 5–15)
BUN: 22 mg/dL (ref 8–23)
CO2: 32 mmol/L (ref 22–32)
Calcium: 8.3 mg/dL — ABNORMAL LOW (ref 8.9–10.3)
Chloride: 99 mmol/L (ref 98–111)
Creatinine, Ser: 0.9 mg/dL (ref 0.44–1.00)
GFR calc Af Amer: >60 mL/min (ref >60)
GFR calc non Af Amer: 58 mL/min — ABNORMAL LOW (ref >60)
Glucose, Bld: 82 mg/dL (ref 70–99)
Potassium: 3.1 mmol/L — ABNORMAL LOW (ref 3.5–5.1)
Sodium: 140 mmol/L (ref 135–145)
Total Bilirubin: 1.2 mg/dL (ref 0.3–1.2)
Total Protein: 5.9 g/dL — ABNORMAL LOW (ref 6.5–8.1)

## 2019-09-24 LAB — MAGNESIUM: Magnesium: 1.8 mg/dL (ref 1.7–2.4)

## 2019-09-24 MED ORDER — POTASSIUM CHLORIDE CRYS ER 20 MEQ PO TBCR
40.0000 meq | EXTENDED_RELEASE_TABLET | ORAL | Status: AC
  Administered 2019-09-24 (×2): 40 meq via ORAL
  Filled 2019-09-24 (×2): qty 2

## 2019-09-24 MED ORDER — VITAMIN D3 25 MCG PO TABS: 1000.0000 [IU] | ORAL_TABLET | Freq: Every day | ORAL | 1 refills | Status: AC

## 2019-09-24 MED ORDER — AMOXICILLIN-POT CLAVULANATE 500-125 MG PO TABS
1.0000 | ORAL_TABLET | Freq: Three times a day (TID) | ORAL | 0 refills | Status: DC
Start: 2019-09-24 — End: 2019-10-26

## 2019-09-24 NOTE — Discharge Summary (Addendum)
Physician Discharge Summary  Jenna Carrillo ZOX:[id number] DOB: [birth year] DOA: 09/21/2019  PCP: Creola Corn, MD  Admit date: 09/21/2019 Discharge date: 09/24/2019  Admitted From: Home Disposition: Home  Recommendations for Outpatient Follow-up:  1. Follow ups as below. 2. Please obtain CBC/BMP/Mag at follow up 3. Please follow up on the following pending results: None  Home Health: PT Equipment/Devices: None  Discharge Condition: Stable CODE STATUS: DNR/DNI  Follow-up Information    Creola Corn, MD. Schedule an appointment as soon as possible for a visit in 1 week(s).   Specialty: Internal Medicine Contact information: [address] [place] [address] [phone]        Sande Rives, MD. Schedule an appointment as soon as possible for a visit in 2 week(s).   Specialties: Internal Medicine, Cardiology, Radiology Contact information: [address] [place] [address] [phone]           Hospital Course: 86 year old female with history of COPD, PMR on prednisone, recurrent UTIs, dementia, hypothyroidism, HTN, HLD and GERD brought to ED due to altered mental status.  Diagnosed with UTI, "HCAP", elevated troponin, elevated BNP and AKI.  CT head negative for acute finding.  Received stress dose steroid due to hypotension.  Cardiology consulted and recommended conservative management and echocardiogram for elevated troponin/BNP.  Echo with EF of 50 to 55%, G2 DD and regional wall motion abnormality.  Cardiology, Dr. Ladona Ridgel consulted over the phone and recommended low-dose beta-blocker as patient is not a candidate for catheterization.  Patient is already on carvedilol and Eliquis.  See individual problem list below for more hospital course.  Discharge Diagnoses:  Acute metabolic encephalopathy-multifactorial including sepsis due to UTI, pneumonia, non-STEMI, possible CHF exacerbation and dementia.  Oriented to self and place.  No behavioral  disturbance.  CT head negative.  Likely at baseline. -Frequent reorientation, delirium and fall precautions  Sepsis due to UTI and RLL/RML pneumonia-blood cultures negative.  Not possible to obtain urine culture due to incontinence.  Doubt utility at this time.  CXR with RLL and RML opacity concerning for pneumonia.  Procalcitonin elevated.  Sepsis physiology resolved.  Now saturating well on room air. -Vancomycin 5/5-5/6.  MRSA PCR negative. -Cefepime 5/5-5/7.   Augmentin 5/8-5/10  Elevated troponin/demand ischemia versus non-STEMI: HS troponin 1220> 1088.  EKG without signs of acute ischemic finding.  Patient without chest pain.  Echo with EF of 50 to 55% (previously 65 to 70%), G2 DD and regional wall motion abnormalities. -Cardiology, Dr. Ladona Ridgel recommended beta-blocker and low-dose aspirin as patient is not a candidate for catheterization. -Continue home Coreg.  Patient is on Eliquis and won't add low-dose aspirin due to increased risk of bleeding.  Chronic diastolic CHF: elevated BNP to 4132 but lower than previous value in 01/2019.  Has bilateral crackles but no clinical signs of fluid overload. Echo as above. -Continue home Lasix  Paroxysmal A. fib: Rate controlled on carvedilol. -Continue home Coreg and Eliquis  AKI: b/l Cr 1.0> 1.6 (admit)> 1.06> 0.9  Hypokalemia: Replenished prior to discharge.  Lactic acidosis: Resolved.  Elevated liver enzymes: Likely due to sepsis.  CK within normal.  Resolving.  Chronic COPD-stable. -Resume home meds  Leukocytosis/bandemia: Could be due to sepsis and steroid.  Resolving. -Continue monitoring  PMR -Continue home prednisone  Vitamin D insufficiency -Supplement  Paroxysmal atrial fibrillation Resume home Eliquis.  History of COPD Bronchodilators.  Polymyalgia rheumatica Daily chronic prednisone  Hypothyroidism TSH normal, continue Synthroid  Advanced dementia/mood disorder: Stable -Continue home  medications  Goal of care/DNR/DNI  Family communication: Updated patient's daughter over the phone on 5/7               Discharge Exam: Vitals:   09/23/19 2138 09/24/19 0443  BP: (!) 156/67 (!) 156/69  Pulse: 77 70  Resp: 19 20  Temp: 98.4 F (36.9 C) 98.3 F (36.8 C)  SpO2: 95% 95%    GENERAL: No apparent distress.  Nontoxic. HEENT: MMM.  Vision and hearing grossly intact.  NECK: Supple.  No apparent JVD.  RESP:  No IWOB.  Fair aeration bilaterally. CVS:  RRR. Heart sounds normal.  ABD/GI/GU: Bowel sounds present. Soft. Non tender.  MSK/EXT:  Moves extremities. No apparent deformity. No edema.  SKIN: no apparent skin lesion or wound NEURO: Awake, alert and oriented to self and place. No apparent focal neuro deficit. PSYCH: Calm. Normal affect.  Discharge Instructions  Discharge Instructions    Call MD for:  difficulty breathing, headache or visual disturbances   Complete by: As directed    Call MD for:  persistant dizziness or light-headedness   Complete by: As directed    Call MD for:  persistant nausea and vomiting   Complete by: As directed    Call MD for:  redness, tenderness, or signs of infection (pain, swelling, redness, odor or green/yellow discharge around incision site)   Complete by: As directed    Call MD for:  temperature >100.4   Complete by: As directed    Diet general   Complete by: As directed    Discharge instructions   Complete by: As directed    It has been a pleasure taking care of you! You were hospitalized with sepsis due to urinary tract infection and pneumonia.  We have treated you with IV antibiotics.  With that, your symptoms improved.  We are discharging you on oral antibiotics to complete treatment course. Please review your new medication list and the directions before you take your medications.  Please follow-up with your primary care doctor in 1 to 2 weeks.  Take care,   Increase activity slowly   Complete by: As  directed      Allergies as of 09/24/2019      Reactions   Sulfonamide Derivatives Rash      Medication List    STOP taking these medications   albuterol 108 (90 Base) MCG/ACT inhaler Commonly known as: VENTOLIN HFA   benzonatate 200 MG capsule Commonly known as: TESSALON   DELSYM COUGH/CHEST CONGEST DM PO   nitrofurantoin 100 MG capsule Commonly known as: MACRODANTIN     TAKE these medications   AeroChamber MV inhaler Use as instructed   amitriptyline 50 MG tablet Commonly known as: ELAVIL Take 50 mg by mouth at bedtime.   amoxicillin-clavulanate 500-125 MG tablet Commonly known as: Augmentin Take 1 tablet (500 mg total) by mouth 3 (three) times daily.   apixaban 2.5 MG Tabs tablet Commonly known as: ELIQUIS Take 1 tablet (2.5 mg total) by mouth 2 (two) times daily.   carvedilol 12.5 MG tablet Commonly known as: COREG Take 1 tablet (12.5 mg total) by mouth 2 (two) times daily with a meal.   furosemide 20 MG tablet Commonly known as: Lasix Take 1 tablet (20 mg total) by mouth daily as needed for fluid or edema (Weight gain of more than 3 pounds in 1 day or 5 pounds in 2 days).   ipratropium-albuterol 0.5-2.5 (3) MG/3ML Soln Commonly known as: DUONEB Inhale 3 mLs into the lungs 2 (  two) times daily as needed for cough.   levalbuterol 45 MCG/ACT inhaler Commonly known as: XOPENEX HFA Inhale 1 puff into the lungs every 4 (four) hours as needed for wheezing.   levothyroxine 75 MCG tablet Commonly known as: SYNTHROID Take 1 tablet (75 mcg total) by mouth daily at 6 (six) AM.   multivitamin tablet Take 1 tablet by mouth daily.   omeprazole 20 MG capsule Commonly known as: PRILOSEC Take 20 mg by mouth daily.   predniSONE 1 MG tablet Commonly known as: DELTASONE Take 3 mg by mouth daily.   Spiriva HandiHaler 18 MCG inhalation capsule Generic drug: tiotropium PLACE 1 CAPSULE INTO THE INHALER AND INHALE DAILY What changed:   how much to take  how to  take this  when to take this  additional instructions   Vitamin D3 25 MCG tablet Commonly known as: Vitamin D Take 1 tablet (1,000 Units total) by mouth daily. Start taking on: Sep 25, 2019       Consultations:  Cardiology  Procedures/Studies: 1. Left ventricular ejection fraction, by estimation, is 50 to 55%. The  left ventricle has low normal function. The left ventricle demonstrates  regional wall motion abnormalities (see scoring diagram/findings for  description). Left ventricular diastolic  parameters are consistent with Grade II diastolic dysfunction  (pseudonormalization). There is mild hypokinesis of the left ventricular,  basal-mid anteroseptal wall and inferoseptal wall.  2. Right ventricular systolic function is normal. The right ventricular  size is normal. There is moderately elevated pulmonary artery systolic  pressure.  3. Left atrial size was mildly dilated.  4. The mitral valve is degenerative. Mild mitral valve regurgitation. No  evidence of mitral stenosis.  5. The aortic valve is tricuspid. Aortic valve regurgitation is mild.  Mild aortic valve sclerosis is present, with no evidence of aortic valve  stenosis.  6. The inferior vena cava is normal in size with greater than 50%  respiratory variability, suggesting right atrial pressure of 3 mmHg.    CT HEAD WO CONTRAST  Result Date: 09/21/2019 CLINICAL DATA:  Focal neuro deficit, greater than 6 hours, stroke suspected. Additional history provided: Loss of consciousness. EXAM: CT HEAD WITHOUT CONTRAST TECHNIQUE: Contiguous axial images were obtained from the base of the skull through the vertex without intravenous contrast. COMPARISON:  Head CT 09/01/2019, brain MRI 09/01/2019 FINDINGS: Brain: Stable ill-defined hypoattenuation within the cerebral white matter which is nonspecific, but consistent with chronic small vessel ischemic disease. Mild generalized parenchymal atrophy. There is no acute  intracranial hemorrhage. No demarcated cortical infarct. No extra-axial fluid collection. No evidence of intracranial mass. No midline shift. Vascular: No hyperdense vessel.  Atherosclerotic calcifications. Skull: Normal. Negative for fracture or focal lesion. Sinuses/Orbits: Visualized orbits show no acute finding. No significant paranasal sinus disease or mastoid effusion at the imaged levels. IMPRESSION: 1. No evidence of acute intracranial abnormality. 2. Stable generalized parenchymal atrophy and chronic small vessel ischemic disease. Electronically Signed   By: Jackey Loge DO   On: 09/21/2019 20:14   CT HEAD WO CONTRAST  Result Date: 09/01/2019 CLINICAL DATA:  Confusion and altered mental status EXAM: CT HEAD WITHOUT CONTRAST TECHNIQUE: Contiguous axial images were obtained from the base of the skull through the vertex without intravenous contrast. COMPARISON:  January 24, 2019 FINDINGS: Brain: Mild diffuse atrophy is stable. There is no intracranial mass, hemorrhage, extra-axial fluid collection, or midline shift. There is patchy small vessel disease in the centra semiovale bilaterally. No acute infarct is demonstrable. Vascular: There is no  hyperdense vessel. There is calcification in each carotid siphon region. Skull: The bony calvarium appears intact. Sinuses/Orbits: There is mild mucosal thickening in several ethmoid air cells. Other paranasal sinuses are clear. Orbits appear symmetric bilaterally. Other: Mastoid air cells are clear. IMPRESSION: Mild atrophy with patchy periventricular small vessel disease. No demonstrable acute infarct. No mass or hemorrhage. There are foci of arterial vascular calcification. There is mucosal thickening in several ethmoid air cells. Electronically Signed   By: Bretta Bang III M.D.   On: 09/01/2019 20:07   MR BRAIN WO CONTRAST  Result Date: 09/01/2019 CLINICAL DATA:  Encephalopathy EXAM: MRI HEAD WITHOUT CONTRAST TECHNIQUE: Multiplanar, multiecho pulse  sequences of the brain and surrounding structures were obtained without intravenous contrast. COMPARISON:  Head CT 1521 FINDINGS: Brain: No acute infarct, acute hemorrhage or extra-axial collection. Early confluent hyperintense T2-weighted signal of the periventricular and deep white matter, most commonly due to chronic ischemic microangiopathy. There is generalized atrophy without lobar predilection. No chronic microhemorrhage. Normal midline structures. Vascular: Normal flow voids. Skull and upper cervical spine: Normal marrow signal. Sinuses/Orbits: Negative. Other: None. IMPRESSION: 1. No acute intracranial abnormality. 2. Findings of chronic ischemic microangiopathy. Electronically Signed   By: Deatra Robinson M.D.   On: 09/01/2019 22:36   CT ABDOMEN PELVIS W CONTRAST  Result Date: 09/01/2019 CLINICAL DATA:  Urinary frequency, weakness, confusion EXAM: CT ABDOMEN AND PELVIS WITH CONTRAST TECHNIQUE: Multidetector CT imaging of the abdomen and pelvis was performed using the standard protocol following bolus administration of intravenous contrast. CONTRAST:  56mL OMNIPAQUE IOHEXOL 300 MG/ML  SOLN COMPARISON:  01/26/2019 FINDINGS: Lower chest: There is a trace right pleural effusion. Mild vascular congestion. There is right middle lobe bronchiectasis, with right basilar bronchial wall thickening. No acute airspace disease or pneumothorax. Hepatobiliary: No focal liver abnormality is seen. No gallstones, gallbladder wall thickening, or biliary dilatation. Pancreas: Unremarkable. No pancreatic ductal dilatation or surrounding inflammatory changes. Spleen: Normal in size without focal abnormality. Adrenals/Urinary Tract: Adrenal glands are unremarkable. Kidneys are normal, without renal calculi, focal lesion, or hydronephrosis. Bladder is unremarkable. Stomach/Bowel: Moderate retained stool throughout the colon. No bowel obstruction or ileus. No bowel wall thickening or inflammatory change. Vascular/Lymphatic:  Aortic atherosclerosis. No enlarged abdominal or pelvic lymph nodes. Reproductive: Status post hysterectomy. No adnexal masses. Other: Trace free fluid in the pelvis is nonspecific. No free gas. No abdominal wall hernia. Musculoskeletal: No acute or destructive bony lesions. Minimal grade 1 anterolisthesis of L5 on S1 likely due to facet hypertrophy. Reconstructed images demonstrate no additional findings. IMPRESSION: 1. Right middle lobe bronchiectasis with right basilar bronchial wall thickening, which could reflect reactive airway disease or bronchitis. 2. Trace right pleural effusion. 3. Trace pelvic free fluid, nonspecific. No other acute intra-abdominal or intrapelvic process. 4. Moderate fecal retention. 5.  Aortic Atherosclerosis (ICD10-I70.0). Electronically Signed   By: Sharlet Salina M.D.   On: 09/01/2019 23:31   DG Chest Port 1 View  Result Date: 09/23/2019 CLINICAL DATA:  Shortness of breath. EXAM: PORTABLE CHEST 1 VIEW COMPARISON:  09/21/2019.  09/01/2019.  02/15/2018. FINDINGS: Mediastinum hilar structures normal. Heart size normal. Diffuse bilateral interstitial prominence again noted without interim change consistent chronic interstitial lung disease. Calcified pulmonary nodule right upper lung consistent with granuloma. Mild bibasilar atelectasis. Tiny bilateral pleural effusions cannot be excluded. No pneumothorax. Surgical clips right neck. IMPRESSION: 1. Low lung volumes with mild bibasilar atelectasis. Tiny bilateral pleural effusions cannot be excluded. 2. Diffuse bilateral interstitial prominence again noted without interim change consistent chronic interstitial  lung disease. Electronically Signed   By: Maisie Fus  Register   On: 09/23/2019 09:50   DG Chest Port 1 View  Addendum Date: 09/21/2019   ADDENDUM REPORT: 09/21/2019 14:59 ADDENDUM: These results were called by telephone at the time of interpretation on 09/21/2019 at 2:59 pm to provider [facility] , who verbally acknowledged these  results. Electronically Signed   By: Jackey Loge DO   On: 09/21/2019 14:59   Result Date: 09/21/2019 CLINICAL DATA:  Weakness, altered LOC, family reports recent UTI EXAM: PORTABLE CHEST 1 VIEW COMPARISON:  Prior chest radiographs 09/01/2019 and earlier FINDINGS: Heart size within normal limits. Aortic atherosclerosis. Opacity within the right mid lung suspicious for pneumonia. A linear opacity projects over the right lung apex. No evidence of pleural effusion. No acute bony abnormality identified. IMPRESSION: Opacity within the right mid lung suspicious for pneumonia. Radiographic follow-up to resolution is recommended. A linear opacity projects over the right lung apex. Lung markings are seen beyond this point and this is favored to reflect a skin fold. However, a repeat radiograph is recommended to definitively exclude a right apical pneumothorax. Aortic Atherosclerosis (ICD10-I70.0). Electronically Signed: By: Jackey Loge DO On: 09/21/2019 14:50   DG Chest Port 1 View  Result Date: 09/01/2019 CLINICAL DATA:  Altered mental status EXAM: PORTABLE CHEST 1 VIEW COMPARISON:  01/26/2019 FINDINGS: Cardiac shadow is within normal limits. Aortic calcifications are seen. Vascular congestion is noted although significantly improved when compared with the prior study. No significant edema is noted. No sizable effusion is noted. No acute bony abnormality is seen. IMPRESSION: Mild vascular congestion although significantly improved when compared with the prior exam. No edema is noted at this time. Electronically Signed   By: Alcide Clever M.D.   On: 09/01/2019 19:34   ECHOCARDIOGRAM COMPLETE  Result Date: 09/23/2019    ECHOCARDIOGRAM REPORT   Patient Name:   LILLIEN PETRONIO [facility] Date of Exam: 09/23/2019 Medical Rec #:  [id number]       Height:       64.0 in Accession #:    [id number]      Weight:       119.9 lb Date of Birth:  Mar 07, 1931      BSA:          1.574 m Patient Age:    88 years        BP:           154/71 mmHg  Patient Gender: F               HR:           91 bpm. Exam Location:  Inpatient Procedure: 2D Echo Indications:    Elevated Troponin  History:        Patient has prior history of Echocardiogram examinations, most                 recent 04/26/2019. CHF, COPD, Arrythmias:Atrial Fibrillation;                 Risk Factors:Hypertension. Sepsis.  Sonographer:    Leeroy Bock Turrentine Referring Phys: [id number] Boyce Medici Jenna Carrillo IMPRESSIONS  1. Left ventricular ejection fraction, by estimation, is 50 to 55%. The left ventricle has low normal function. The left ventricle demonstrates regional wall motion abnormalities (see scoring diagram/findings for description). Left ventricular diastolic  parameters are consistent with Grade II diastolic dysfunction (pseudonormalization). There is mild hypokinesis of the left ventricular, basal-mid anteroseptal wall and inferoseptal wall.  2. Right ventricular systolic function  is normal. The right ventricular size is normal. There is moderately elevated pulmonary artery systolic pressure.  3. Left atrial size was mildly dilated.  4. The mitral valve is degenerative. Mild mitral valve regurgitation. No evidence of mitral stenosis.  5. The aortic valve is tricuspid. Aortic valve regurgitation is mild. Mild aortic valve sclerosis is present, with no evidence of aortic valve stenosis.  6. The inferior vena cava is normal in size with greater than 50% respiratory variability, suggesting right atrial pressure of 3 mmHg. FINDINGS  Left Ventricle: Left ventricular ejection fraction, by estimation, is 50 to 55%. The left ventricle has low normal function. The left ventricle demonstrates regional wall motion abnormalities. Mild hypokinesis of the left ventricular, basal-mid anteroseptal wall and inferoseptal wall. The left ventricular internal cavity size was normal in size. There is no left ventricular hypertrophy. Left ventricular diastolic parameters are consistent with Grade II diastolic dysfunction  (pseudonormalization). Right Ventricle: The right ventricular size is normal. No increase in right ventricular wall thickness. Right ventricular systolic function is normal. There is moderately elevated pulmonary artery systolic pressure. The tricuspid regurgitant velocity is 3.57 m/s, and with an assumed right atrial pressure of 3 mmHg, the estimated right ventricular systolic pressure is 54.0 mmHg. Left Atrium: Left atrial size was mildly dilated. Right Atrium: Right atrial size was normal in size. Pericardium: Trivial pericardial effusion is present. Mitral Valve: The mitral valve is degenerative in appearance. There is mild thickening of the mitral valve leaflet(s). Severe mitral annular calcification. Mild mitral valve regurgitation. No evidence of mitral valve stenosis. Tricuspid Valve: The tricuspid valve is normal in structure. Tricuspid valve regurgitation is mild . No evidence of tricuspid stenosis. Aortic Valve: The aortic valve is tricuspid. . There is mild thickening and mild calcification of the aortic valve. Aortic valve regurgitation is mild. Mild aortic valve sclerosis is present, with no evidence of aortic valve stenosis. There is mild thickening of the aortic valve. There is mild calcification of the aortic valve. Pulmonic Valve: The pulmonic valve was not well visualized. Pulmonic valve regurgitation is not visualized. No evidence of pulmonic stenosis. Aorta: The aortic root is normal in size and structure. Venous: The inferior vena cava is normal in size with greater than 50% respiratory variability, suggesting right atrial pressure of 3 mmHg. IAS/Shunts: No atrial level shunt detected by color flow Doppler.  LEFT VENTRICLE PLAX 2D LVIDd:         4.60 cm  Diastology LVIDs:         3.40 cm  LV e' lateral:   10.20 cm/s LV PW:         0.90 cm  LV E/e' lateral: 15.7 LV IVS:        0.90 cm  LV e' medial:    6.60 cm/s LVOT diam:     1.60 cm  LV E/e' medial:  24.3 LV SV:         33 LV SV Index:   21  LVOT Area:     2.01 cm  RIGHT VENTRICLE RV S prime:     10.90 cm/s TAPSE (M-mode): 1.8 cm LEFT ATRIUM             Index       RIGHT ATRIUM           Index LA diam:        3.70 cm 2.35 cm/m  RA Area:     10.10 cm LA Vol (A2C):   36.4 ml 23.12 ml/m RA Volume:  20.30 ml  12.90 ml/m LA Vol (A4C):   41.3 ml 26.24 ml/m LA Biplane Vol: 39.1 ml 24.84 ml/m  AORTIC VALVE LVOT Vmax:   85.50 cm/s LVOT Vmean:  62.600 cm/s LVOT VTI:    0.164 m  AORTA Ao Root diam: 2.40 cm MITRAL VALVE                TRICUSPID VALVE MV Area (PHT): 4.10 cm     TR Peak grad:   51.0 mmHg MV Decel Time: 185 msec     TR Vmax:        357.00 cm/s MV E velocity: 160.50 cm/s MV A velocity: 145.00 cm/s  SHUNTS MV E/A ratio:  1.11         Systemic VTI:  0.16 m                             Systemic Diam: 1.60 cm Jodelle Red MD Electronically signed by Jodelle Red MD Signature Date/Time: 09/23/2019/4:49:00 PM    Final         The results of significant diagnostics from this hospitalization (including imaging, microbiology, ancillary and laboratory) are listed below for reference.     Microbiology: Recent Results (from the past 240 hour(s))  Culture, blood (routine x 2)     Status: None (Preliminary result)   Collection Time: 09/21/19  2:06 PM   Specimen: BLOOD  Result Value Ref Range Status   Specimen Description   Final    BLOOD RIGHT ANTECUBITAL Performed at [facility], [address] [place], [place] [address]    Special Requests   Final    BOTTLES DRAWN AEROBIC AND ANAEROBIC Blood Culture adequate volume Performed at [facility], [address] [place], [place] [address]    Culture   Final    NO GROWTH 3 DAYS Performed at [facility], [address] [place], [place] [address]    Report Status PENDING  Incomplete  Culture, blood (routine x 2)     Status: None (Preliminary result)   Collection Time: 09/21/19  2:31 PM   Specimen: BLOOD LEFT FOREARM   Result Value Ref Range Status   Specimen Description   Final    BLOOD LEFT FOREARM Performed at [facility], [address], [place], [place] [address]    Special Requests   Final    BOTTLES DRAWN AEROBIC AND ANAEROBIC Blood Culture results may not be optimal due to an inadequate volume of blood received in culture bottles Performed at [facility], [address] [place], [place] [address]    Culture   Final    NO GROWTH 3 DAYS Performed at [facility], [address] [place], [place] [address]    Report Status PENDING  Incomplete  Respiratory Panel by RT PCR (Flu A&B, Covid) - Nasopharyngeal Swab     Status: None   Collection Time: 09/21/19  3:56 PM   Specimen: Nasopharyngeal Swab  Result Value Ref Range Status   SARS Coronavirus 2 by RT PCR NEGATIVE NEGATIVE Final    Comment: (NOTE) SARS-CoV-2 target nucleic acids are NOT DETECTED. The SARS-CoV-2 RNA is generally detectable in upper respiratoy specimens during the acute phase of infection. The lowest concentration of SARS-CoV-2 viral copies this assay can detect is 131 copies/mL. A negative result does not preclude SARS-Cov-2 infection and should not be used as the sole basis for treatment  or other patient management decisions. A negative result may occur with  improper specimen collection/handling, submission of specimen other than nasopharyngeal swab, presence of viral mutation(s) within the areas targeted by this assay, and inadequate number of viral copies (<131 copies/mL). A negative result must be combined with clinical observations, patient history, and epidemiological information. The expected result is Negative. Fact Sheet for Patients:  https://www.moore.com/ Fact Sheet for Healthcare Providers:  https://www.young.biz/ This test is not yet ap proved or cleared by the [place] FDA and  has been authorized for detection and/or  diagnosis of SARS-CoV-2 by FDA under an Emergency Use Authorization (EUA). This EUA will remain  in effect (meaning this test can be used) for the duration of the COVID-19 declaration under Section 564(b)(1) of the Act, 21 U.S.C. section 360bbb-3(b)(1), unless the authorization is terminated or revoked sooner.    Influenza A by PCR NEGATIVE NEGATIVE Final   Influenza B by PCR NEGATIVE NEGATIVE Final    Comment: (NOTE) The Xpert Xpress SARS-CoV-2/FLU/RSV assay is intended as an aid in  the diagnosis of influenza from Nasopharyngeal swab specimens and  should not be used as a sole basis for treatment. Nasal washings and  aspirates are unacceptable for Xpert Xpress SARS-CoV-2/FLU/RSV  testing. Fact Sheet for Patients: https://www.moore.com/ Fact Sheet for Healthcare Providers: https://www.young.biz/ This test is not yet approved or cleared by the [place] FDA and  has been authorized for detection and/or diagnosis of SARS-CoV-2 by  FDA under an Emergency Use Authorization (EUA). This EUA will remain  in effect (meaning this test can be used) for the duration of the  Covid-19 declaration under Section 564(b)(1) of the Act, 21  U.S.C. section 360bbb-3(b)(1), unless the authorization is  terminated or revoked. Performed at [facility], [address], [place], [place] [address]   MRSA PCR Screening     Status: None   Collection Time: 09/21/19  5:08 PM   Specimen: Nasal Mucosa; Nasopharyngeal  Result Value Ref Range Status   MRSA by PCR NEGATIVE NEGATIVE Final    Comment:        The GeneXpert MRSA Assay (FDA approved for NASAL specimens only), is one component of a comprehensive MRSA colonization surveillance program. It is not intended to diagnose MRSA infection nor to guide or monitor treatment for MRSA infections. Performed at [facility], [address] [place], [place] [address]   Urine  culture     Status: None   Collection Time: 09/22/19  4:00 PM   Specimen: Urine  Result Value Ref Range Status   Specimen Description   Final    URINE, RANDOM Performed at [facility], [address], [place], [place] [address]    Special Requests   Final    NONE Performed at [facility], [address] [place], [place] [address]    Culture   Final    NO GROWTH Performed at [facility], [address], [place], [place] [address]    Report Status 09/23/2019 FINAL  Final     Labs: BNP (last 3 results) Recent Labs    01/29/19 0522 09/21/19 1421  BNP 1,930.6* 1,475.7*   Basic Metabolic Panel: Recent Labs  Lab 09/21/19 1421 09/22/19 0216 09/23/19 0529 09/24/19 0705  NA 141 139 138 140  K 4.1 4.2 3.8 3.1*  CL 105 106 105 99  CO2 32  GLUCOSE 96 95 93 82  BUN 22 21 28* 22  CREATININE 1.61* 1.10* 1.06* 0.90  CALCIUM 8.0* 8.1* 8.4* 8.3*  MG  --  1.8 2.1 1.8   Liver Function Tests: Recent Labs  Lab 09/21/19 1421 09/22/19 0216 09/23/19 0529 09/24/19 0705  AST 105* 117* 64* 34  ALT 65* 85* 70* 55*  ALKPHOS 115 106 101 102  BILITOT 0.8 1.6* 0.8 1.2  PROT 5.7* 5.4* 5.6* 5.9*  ALBUMIN 2.8* 2.6* 2.7* 2.8*   No results for input(s): LIPASE, AMYLASE in the last 168 hours. Recent Labs  Lab 09/21/19 1845  AMMONIA 40*   CBC: Recent Labs  Lab 09/21/19 1421 09/21/19 1846 09/22/19 0216 09/23/19 0529 09/24/19 0705  WBC 22.8*  --  23.9* 16.3* 10.8*  NEUTROABS 21.2*  --   --   --   --   HGB 10.5*  --  9.8* 9.8* 10.6*  HCT 33.7* 33.7* 33.2* 31.4* 33.2*  MCV 100.6*  --  104.4* 99.4 97.4  PLT 202  --  150 173 182   Cardiac Enzymes: Recent Labs  Lab 09/23/19 0529  CKTOTAL 80   BNP: Invalid input(s): POCBNP CBG: Recent Labs  Lab 09/22/19 1700 09/22/19 2143 09/23/19 0739 09/23/19 1331 09/23/19 1633  GLUCAP 123* 104* 74 110* 114*   D-Dimer No results for input(s): DDIMER in the last 72 hours. Hgb  A1c Recent Labs    09/21/19 1846  HGBA1C 5.6   Lipid Profile No results for input(s): CHOL, HDL, LDLCALC, TRIG, CHOLHDL, LDLDIRECT in the last 72 hours. Thyroid function studies Recent Labs    09/21/19 1735  TSH 2.439   Anemia work up Recent Labs    09/21/19 1735  VITAMINB12 490   Urinalysis    Component Value Date/Time   COLORURINE YELLOW 09/22/2019 1600   APPEARANCEUR HAZY (A) 09/22/2019 1600   LABSPEC 1.012 09/22/2019 1600   PHURINE 5.0 09/22/2019 1600   GLUCOSEU NEGATIVE 09/22/2019 1600   HGBUR MODERATE (A) 09/22/2019 1600   BILIRUBINUR NEGATIVE 09/22/2019 1600   KETONESUR NEGATIVE 09/22/2019 1600   PROTEINUR NEGATIVE 09/22/2019 1600   NITRITE NEGATIVE 09/22/2019 1600   LEUKOCYTESUR NEGATIVE 09/22/2019 1600   Sepsis Labs Invalid input(s): PROCALCITONIN,  WBC,  LACTICIDVEN   Time coordinating discharge: 40 minutes  SIGNED:  Mercy Riding, MD  [facility] 09/24/2019, 12:49 PM  If 7PM-7AM, please contact night-coverage [email or web address] Password [email or web address]

## 2019-09-26 LAB — CULTURE, BLOOD (ROUTINE X 2)
Culture: NO GROWTH
Culture: NO GROWTH
Special Requests: ADEQUATE

## 2019-09-26 IMAGING — DX DG ABD PORTABLE 1V
1 series · 1 of 1 positions shown · non-contrast
Comparison: None.

CLINICAL DATA: Abdominal discomfort and shortness of breath.

EXAM:
PORTABLE ABDOMEN - 1 VIEW

[abdomen kub]
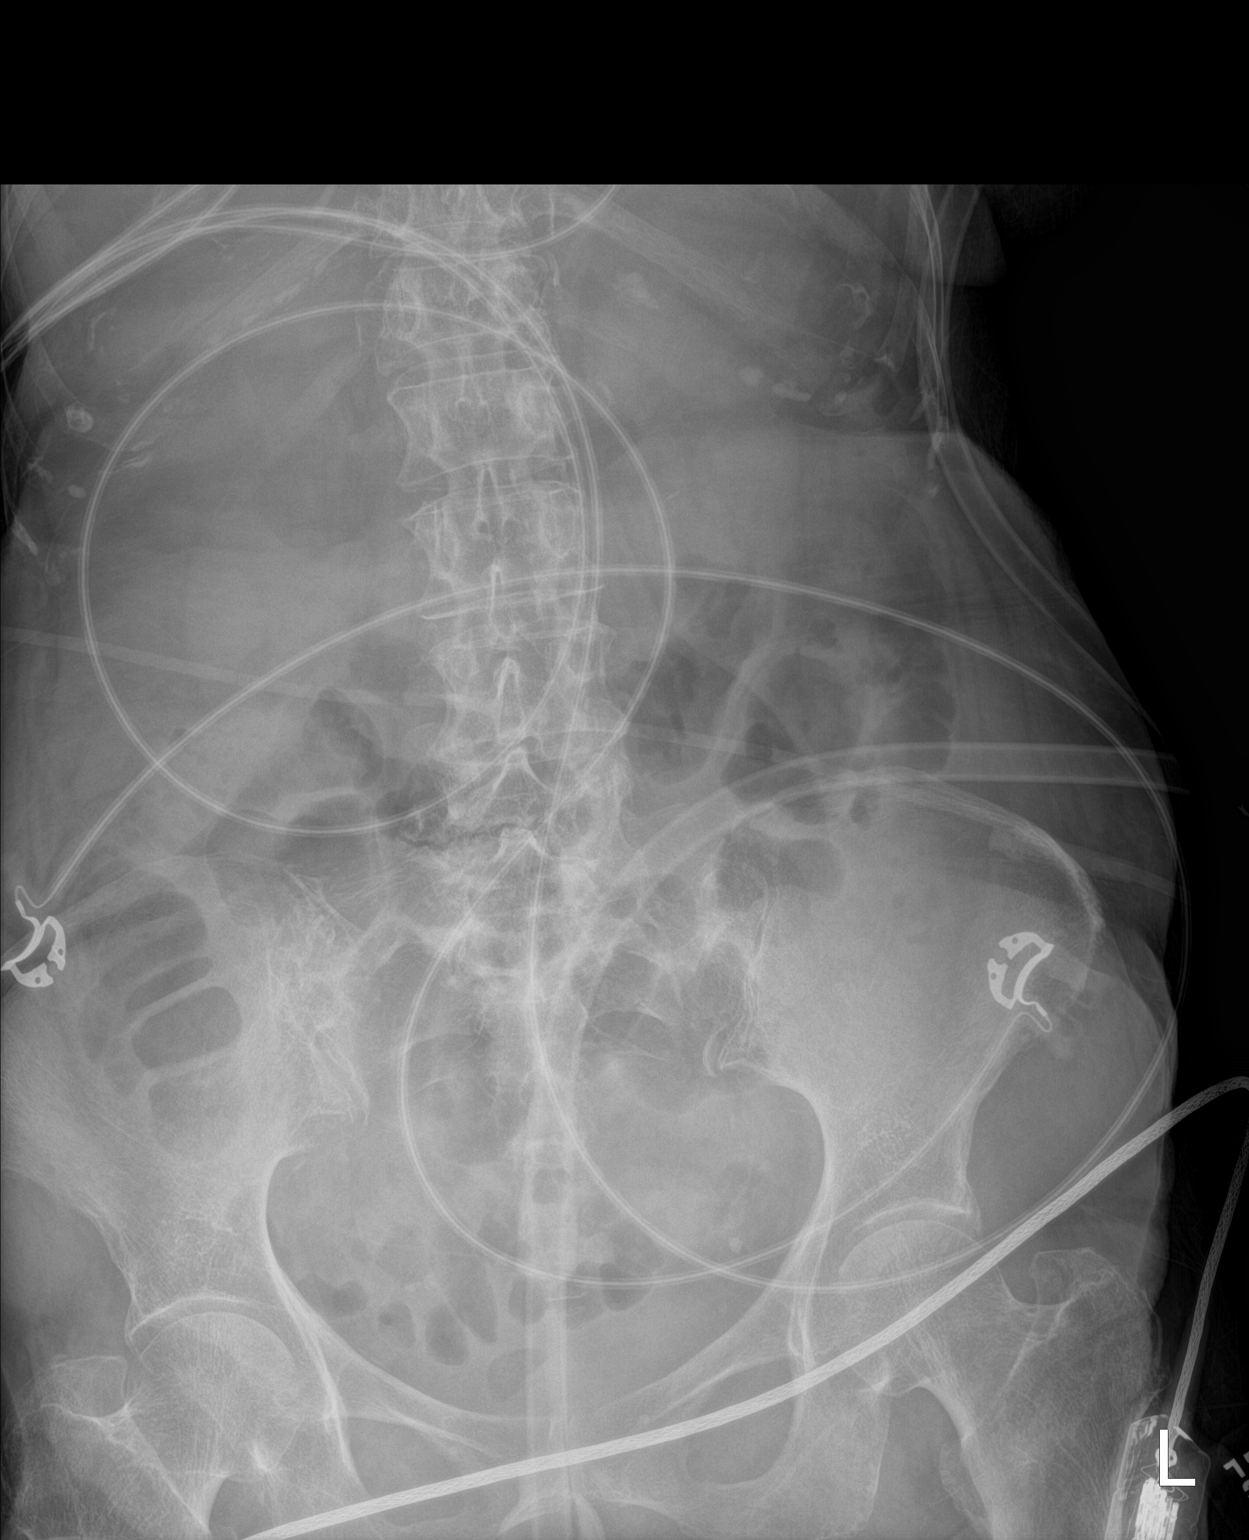

[1 of 1 positions shown; findings below may reference images not displayed]

FINDINGS: The bowel gas pattern is normal. No radio-opaque calculi or other
significant radiographic abnormality are seen.
IMPRESSION: Negative exam.

## 2019-10-04 ENCOUNTER — Other Ambulatory Visit: Payer: Self-pay

## 2019-10-04 ENCOUNTER — Encounter (HOSPITAL_COMMUNITY): Payer: Self-pay

## 2019-10-04 ENCOUNTER — Emergency Department (HOSPITAL_COMMUNITY): Payer: Medicare Other

## 2019-10-04 ENCOUNTER — Emergency Department (HOSPITAL_COMMUNITY)
Admission: EM | Admit: 2019-10-04 | Discharge: 2019-10-04 | Disposition: A | Discharge status: Home / Self Care | Payer: Medicare Other | Attending: Emergency Medicine | Admitting: Emergency Medicine

## 2019-10-04 DIAGNOSIS — I4891 Unspecified atrial fibrillation: Secondary | ICD-10-CM | POA: Insufficient documentation

## 2019-10-04 DIAGNOSIS — N3001 Acute cystitis with hematuria: Secondary | ICD-10-CM | POA: Diagnosis not present

## 2019-10-04 DIAGNOSIS — J449 Chronic obstructive pulmonary disease, unspecified: Secondary | ICD-10-CM | POA: Insufficient documentation

## 2019-10-04 DIAGNOSIS — Z79899 Other long term (current) drug therapy: Secondary | ICD-10-CM | POA: Insufficient documentation

## 2019-10-04 DIAGNOSIS — I10 Essential (primary) hypertension: Secondary | ICD-10-CM | POA: Insufficient documentation

## 2019-10-04 DIAGNOSIS — R300 Dysuria: Secondary | ICD-10-CM | POA: Diagnosis present

## 2019-10-04 LAB — URINALYSIS, ROUTINE W REFLEX MICROSCOPIC
Bilirubin Urine: NEGATIVE
Glucose, UA: NEGATIVE mg/dL
Ketones, ur: NEGATIVE mg/dL
Nitrite: NEGATIVE
Protein, ur: NEGATIVE mg/dL
Specific Gravity, Urine: 1.006 (ref 1.005–1.030)
pH: 7 (ref 5.0–8.0)

## 2019-10-04 LAB — CBC WITH DIFFERENTIAL/PLATELET
Abs Immature Granulocytes: 0.04 10*3/uL (ref 0.00–0.07)
Basophils Absolute: 0.1 10*3/uL (ref 0.0–0.1)
Basophils Relative: 1 %
Eosinophils Absolute: 0.2 10*3/uL (ref 0.0–0.5)
Eosinophils Relative: 2 %
HCT: 40.8 % (ref 36.0–46.0)
Hemoglobin: 12.4 g/dL (ref 12.0–15.0)
Immature Granulocytes: 0 %
Lymphocytes Relative: 16 %
Lymphs Abs: 1.4 10*3/uL (ref 0.7–4.0)
MCH: 30.5 pg (ref 26.0–34.0)
MCHC: 30.4 g/dL (ref 30.0–36.0)
MCV: 100.2 fL — ABNORMAL HIGH (ref 80.0–100.0)
Monocytes Absolute: 0.8 10*3/uL (ref 0.1–1.0)
Monocytes Relative: 9 %
Neutro Abs: 6.6 10*3/uL (ref 1.7–7.7)
Neutrophils Relative %: 72 %
Platelets: 448 10*3/uL — ABNORMAL HIGH (ref 150–400)
RBC: 4.07 MIL/uL (ref 3.87–5.11)
RDW: 13.8 % (ref 11.5–15.5)
WBC: 9.1 10*3/uL (ref 4.0–10.5)
nRBC: 0 % (ref 0.0–0.2)

## 2019-10-04 LAB — BASIC METABOLIC PANEL
Anion gap: 10 (ref 5–15)
BUN: 17 mg/dL (ref 8–23)
CO2: 32 mmol/L (ref 22–32)
Calcium: 8.9 mg/dL (ref 8.9–10.3)
Chloride: 100 mmol/L (ref 98–111)
Creatinine, Ser: 1.09 mg/dL — ABNORMAL HIGH (ref 0.44–1.00)
GFR calc Af Amer: 53 mL/min — ABNORMAL LOW (ref >60)
GFR calc non Af Amer: 46 mL/min — ABNORMAL LOW (ref >60)
Glucose, Bld: 81 mg/dL (ref 70–99)
Potassium: 4.2 mmol/L (ref 3.5–5.1)
Sodium: 142 mmol/L (ref 135–145)

## 2019-10-04 LAB — TROPONIN I (HIGH SENSITIVITY): Troponin I (High Sensitivity): 10 ng/L (ref <18)

## 2019-10-04 MED ORDER — CEPHALEXIN 500 MG PO CAPS
500.0000 mg | ORAL_CAPSULE | Freq: Four times a day (QID) | ORAL | 0 refills | Status: AC
Start: 2019-10-04 — End: 2019-10-11

## 2019-10-04 MED ORDER — SODIUM CHLORIDE 0.9 % IV SOLN
1.0000 g | Freq: Once | INTRAVENOUS | Status: AC
  Administered 2019-10-04: 1 g via INTRAVENOUS
  Filled 2019-10-04: qty 10

## 2019-10-04 NOTE — ED Triage Notes (Signed)
Arrived by EMS from home. Patient's daughter reports painful urination with foul odor since this morning. Daughter concerned because patient was hospitalized earlier this month with sepsis, secondary to UTI. Patient had similar symptoms before last admission.

## 2019-10-04 NOTE — ED Provider Notes (Signed)
[facility]-EMERGENCY DEPT Provider Note   CSN: [id number] Arrival date & time: 10/04/19  1558     History Chief Complaint  Patient presents with  . Urinary Tract Infection    Jenna Carrillo is a 86 y.o. female.  HPI    Pt is an 86 y/o female with a h/o bronchiectasis, COPD, DDD, eczema, hiatal hernia, HLD, HTN, OA, polymyalgia rheumatica, who presents to the ED today for eval of dysuria. Pt states she started having dysuria in the last 24 hours. At this time she denies frequency, urgency, hematuria, fevers, abd pain, nvd. Denies cough. Hx is somewhat limited 2/2 patients h/o dementia.   6:24 PM Discussed case with the patient's daughter, Audry Riles, who states that pt started c/o dysuria in the last 48 hours. She further states that patient was c/o some right upper back pain and she was worried the patient may be having a cardiac issue. States patient has been compliant with her meds at home. She denies and cough, fever, vomiting, or other symptoms. States that patient has been at her mental baseline today.   Past Medical History:  Diagnosis Date  . Bronchiectasis   . COPD (chronic obstructive pulmonary disease) (HCC)   . Degenerative disk disease   . Eczema   . Gastric ulcer   . GERD (gastroesophageal reflux disease)   . Hiatal hernia   . Hyperlipidemia   . Hypertension   . Hypothyroidism   . Osteoarthritis   . Polymyalgia rheumatica (HCC)   . Pulmonary nodule, right   . Vertebrobasilar insufficiency     Patient Active Problem List   Diagnosis Date Noted  . Sepsis secondary to UTI (HCC) 09/21/2019  . Proteus mirabilis infection 09/18/2019  . Acute lower UTI 09/18/2019  . Metabolic encephalopathy 09/02/2019  . Acute metabolic encephalopathy 09/01/2019  . Leukocytosis 09/01/2019  . Acute systolic heart failure (HCC) 01/28/2019  . Atrial fibrillation (HCC)   . Current chronic use of systemic steroids 01/25/2019  . Encephalopathy 01/25/2019  .  Fall from slip, trip, or stumble, initial encounter   . Generalized weakness   . Sepsis due to pneumonia (HCC) 01/24/2019  . Bronchiectasis with (acute) exacerbation (HCC) 12/20/2018  . Lobar pneumonia, unspecified organism (HCC) 02/15/2018  . Nuclear sclerosis of both eyes 03/05/2016  . Glaucoma suspect, bilateral 03/05/2016  . CAP (community acquired pneumonia) 11/12/2015  . Pulmonary nodules 10/12/2015  . Cough 02/03/2014  . Macular degeneration, left eye 12/28/2013  . Glaucoma suspect, both eyes 12/28/2013  . Allergic rhinitis 11/12/2012  . Nuclear cataract 08/08/2011  . BRONCHIECTASIS 03/09/2009  . COPD (chronic obstructive pulmonary disease) (HCC) 03/09/2009  . Essential hypertension 01/31/2009  . Polymyalgia rheumatica (HCC) 01/31/2009    Past Surgical History:  Procedure Laterality Date  . BREAST CYST EXCISION     right  . broken tail bone    . CAROTID ENDARTERECTOMY     right  . RHINOPLASTY    . TOTAL ABDOMINAL HYSTERECTOMY       OB History   No obstetric history on file.     Family History  Problem Relation Age of Onset  . COPD Mother   . Breast cancer Mother   . Emphysema Mother   . Liver disease Son   . Emphysema Father   . Heart attack Father     Social History   Tobacco Use  . Smoking status: Former Smoker    Packs/day: 2.00    Years: 2.00    Pack years:  4.00    Types: Cigarettes    Quit date: 05/19/1958    Years since quitting: 61.4  . Smokeless tobacco: Never Used  Substance Use Topics  . Alcohol use: No  . Drug use: Never    Home Medications Prior to Admission medications   Medication Sig Start Date End Date Taking? Authorizing Provider  amitriptyline (ELAVIL) 50 MG tablet Take 50 mg by mouth at bedtime.    [provider]  amoxicillin-clavulanate (AUGMENTIN) 500-125 MG tablet Take 1 tablet (500 mg total) by mouth 3 (three) times daily. 09/24/19   Mercy Riding, MD  apixaban (ELIQUIS) 2.5 MG TABS tablet Take 1 tablet (2.5 mg  total) by mouth 2 (two) times daily. 04/07/19   O'NealCassie Freer, MD  carvedilol (COREG) 12.5 MG tablet Take 1 tablet (12.5 mg total) by mouth 2 (two) times daily with a meal. 04/07/19   O'Neal, Cassie Freer, MD  cephALEXin (KEFLEX) 500 MG capsule Take 1 capsule (500 mg total) by mouth 4 (four) times daily for 7 days. 10/04/19 10/11/19  Essance Gatti S, PA-C  cholecalciferol (VITAMIN D) 25 MCG tablet Take 1 tablet (1,000 Units total) by mouth daily. 09/25/19   Mercy Riding, MD  furosemide (LASIX) 20 MG tablet Take 1 tablet (20 mg total) by mouth daily as needed for fluid or edema (Weight gain of more than 3 pounds in 1 day or 5 pounds in 2 days). 01/30/19 01/30/20  Lavina Hamman, MD  ipratropium-albuterol (DUONEB) 0.5-2.5 (3) MG/3ML SOLN Inhale 3 mLs into the lungs 2 (two) times daily as needed for cough. 08/08/19   [provider]  levalbuterol (XOPENEX HFA) 45 MCG/ACT inhaler Inhale 1 puff into the lungs every 4 (four) hours as needed for wheezing. 01/30/19 01/30/20  Lavina Hamman, MD  levothyroxine (SYNTHROID) 75 MCG tablet Take 1 tablet (75 mcg total) by mouth daily at 6 (six) AM. 01/31/19   Lavina Hamman, MD  Multiple Vitamin (MULTIVITAMIN) tablet Take 1 tablet by mouth daily.      [provider]  omeprazole (PRILOSEC) 20 MG capsule Take 20 mg by mouth daily.     [provider]  predniSONE (DELTASONE) 1 MG tablet Take 3 mg by mouth daily.     [provider]  Spacer/Aero-Holding Chambers (AEROCHAMBER MV) inhaler Use as instructed 08/01/19   Parrett, Fonnie Mu, NP  tiotropium (SPIRIVA HANDIHALER) 18 MCG inhalation capsule PLACE 1 CAPSULE INTO THE INHALER AND INHALE DAILY Patient taking differently: Place 18 mcg into inhaler and inhale daily.  08/01/19   Parrett, Fonnie Mu, NP    Allergies    Sulfonamide derivatives  Review of Systems   Review of Systems  Unable to perform ROS: Dementia  Genitourinary: Positive for dysuria.    Physical Exam Updated  Vital Signs BP (!) 168/89 (BP Location: Left Arm)   Pulse 80   Temp 98.3 F (36.8 C) (Oral)   Resp 16   Ht 5\' 4"  (1.626 m)   Wt 52.5 kg   SpO2 100%   BMI 19.87 kg/m   Physical Exam Vitals and nursing note reviewed.  Constitutional:      General: She is not in acute distress.    Appearance: She is well-developed.  HENT:     Head: Normocephalic and atraumatic.  Eyes:     Conjunctiva/sclera: Conjunctivae normal.  Cardiovascular:     Rate and Rhythm: Normal rate and regular rhythm.     Heart sounds: Normal heart sounds. No murmur.  Pulmonary:     Effort: Pulmonary effort is normal. No respiratory distress.     Breath sounds: Normal breath sounds. No wheezing, rhonchi or rales.  Abdominal:     General: Bowel sounds are normal.     Palpations: Abdomen is soft.     Tenderness: There is no abdominal tenderness. There is no right CVA tenderness, left CVA tenderness, guarding or rebound.  Musculoskeletal:     Cervical back: Neck supple.  Skin:    General: Skin is warm and dry.  Neurological:     Mental Status: She is alert.     Comments: Clear speech, no facial droop, moving all extremities, following commands. Demented.      ED Results / Procedures / Treatments   Labs (all labs ordered are listed, but only abnormal results are displayed) Labs Reviewed  CBC WITH DIFFERENTIAL/PLATELET - Abnormal; Notable for the following components:      Result Value   MCV 100.2 (*)    Platelets 448 (*)    All other components within normal limits  BASIC METABOLIC PANEL - Abnormal; Notable for the following components:   Creatinine, Ser 1.09 (*)    GFR calc non Af Amer 46 (*)    GFR calc Af Amer 53 (*)    All other components within normal limits  URINALYSIS, ROUTINE W REFLEX MICROSCOPIC - Abnormal; Notable for the following components:   Color, Urine STRAW (*)    Hgb urine dipstick SMALL (*)    Leukocytes,Ua TRACE (*)    Bacteria, UA RARE (*)    All other components within normal  limits  URINE CULTURE  TROPONIN I (HIGH SENSITIVITY)    EKG EKG Interpretation  Date/Time:  Tuesday Oct 04 2019 19:01:05 EDT Ventricular Rate:  83 PR Interval:    QRS Duration: 79 QT Interval:  349 QTC Calculation: 410 R Axis:   52 Text Interpretation: Sinus rhythm Abnormal R-wave progression, early transition No significant change since last tracing Confirmed by Richardean Canal [phone]) on 10/04/2019 7:35:38 PM   Radiology DG Chest Portable 1 View  Result Date: 10/04/2019 CLINICAL DATA:  86 year old female with painful urination and foul smelling urine. Upper back pain. EXAM: PORTABLE CHEST 1 VIEW COMPARISON:  Portable chest 09/23/2019 and earlier. FINDINGS: Portable AP semi upright view at 1841 hours. Improved lung volumes and bibasilar ventilation. Normal cardiac size and mediastinal contours. Visualized tracheal air column is within normal limits. No pneumothorax, pulmonary edema, pleural effusion or consolidation. Mild perihilar scarring or atelectasis atelectasis appears chronic. No acute pulmonary opacity. Chronic surgical clips in the right lower neck. No acute osseous abnormality identified. IMPRESSION: No acute cardiopulmonary abnormality. Electronically Signed   By: Odessa Fleming M.D.   On: 10/04/2019 18:58    Procedures Procedures (including critical care time)  Medications Ordered in ED Medications  cefTRIAXone (ROCEPHIN) 1 g in sodium chloride 0.9 % 100 mL IVPB (1 g Intravenous New Bag/Given 10/04/19 1858)    ED Course  I have reviewed the triage vital signs and the nursing notes.  Pertinent labs & imaging results that were available during my care of the patient were reviewed by me and considered in my medical decision making (see chart for details).    MDM Rules/Calculators/A&P                      86 year old female presenting for evaluation of dysuria.  She has had several recent admissions for UTIs that have led to sepsis.  Daughter concerned  because patient  complaining of dysuria yesterday.  Patient afebrile, somewhat hypertensive but vital signs are otherwise reassuring.  She is nontoxic and well-appearing on exam.  We will get labs, chest x-ray, EKG.  CBC is without leukocytosis or anemia BMP at baseline Trop negative UA with hematuria, trace leukocytes, 0-5 RBCs, 11-20 WBCs and rare bacteria.  Urine culture was sent.    - Prior urine cultures were reviewed in epic and cultures were essentially pansensitive with the exception of resistance to nitrofurantoin.    - Will give a dose of ceftriaxone in the ED.  EKG with Sinus rhythm Abnormal R-wave progression, early transition No significant change since last tracing   CXR with co acute cardiopulmonary abnormality.  Pts w/u is reassuring today. She was tx for UTI. Her w/u and presentation today does not suggest sepsis. Feel she is appropriate for outpt tx of her UTI. Updated daughter and advised on close f/u with pcp and strict return precautions. She voices understanding of the plan and reasons to return. All questions answered, pt stable for d/c.   Final Clinical Impression(s) / ED Diagnoses Final diagnoses:  Acute cystitis with hematuria    Rx / DC Orders ED Discharge Orders         Ordered    cephALEXin (KEFLEX) 500 MG capsule  4 times daily     10/04/19 1952           Karrie Meres, PA-C 10/04/19 1953    Charlynne Pander, MD 10/04/19 2330

## 2019-10-04 NOTE — Discharge Instructions (Signed)
You were given a prescription for antibiotics. Please take the antibiotic prescription fully.   Please follow up with your primary care provider within 5-7 days for re-evaluation of your symptoms. If you do not have a primary care provider, information for a healthcare clinic has been provided for you to make arrangements for follow up care. Please return to the emergency department for any new or worsening symptoms.  

## 2019-10-07 LAB — URINE CULTURE: Culture: 20000 — AB

## 2019-10-23 ENCOUNTER — Encounter (HOSPITAL_COMMUNITY): Payer: Self-pay

## 2019-10-23 ENCOUNTER — Inpatient Hospital Stay (HOSPITAL_COMMUNITY)
Admission: EM | Admit: 2019-10-23 | Discharge: 2019-10-26 | DRG: 871 | Disposition: A | Discharge status: Home Health | Payer: Medicare Other | Attending: Internal Medicine | Admitting: Internal Medicine

## 2019-10-23 ENCOUNTER — Emergency Department (HOSPITAL_COMMUNITY): Payer: Medicare Other

## 2019-10-23 ENCOUNTER — Other Ambulatory Visit: Payer: Self-pay

## 2019-10-23 DIAGNOSIS — J439 Emphysema, unspecified: Secondary | ICD-10-CM | POA: Diagnosis not present

## 2019-10-23 DIAGNOSIS — Z87440 Personal history of urinary (tract) infections: Secondary | ICD-10-CM

## 2019-10-23 DIAGNOSIS — E785 Hyperlipidemia, unspecified: Secondary | ICD-10-CM | POA: Diagnosis present

## 2019-10-23 DIAGNOSIS — Z20822 Contact with and (suspected) exposure to covid-19: Secondary | ICD-10-CM | POA: Diagnosis present

## 2019-10-23 DIAGNOSIS — J189 Pneumonia, unspecified organism: Secondary | ICD-10-CM

## 2019-10-23 DIAGNOSIS — I10 Essential (primary) hypertension: Secondary | ICD-10-CM | POA: Diagnosis present

## 2019-10-23 DIAGNOSIS — A419 Sepsis, unspecified organism: Secondary | ICD-10-CM | POA: Diagnosis not present

## 2019-10-23 DIAGNOSIS — Z90710 Acquired absence of both cervix and uterus: Secondary | ICD-10-CM

## 2019-10-23 DIAGNOSIS — F0390 Unspecified dementia without behavioral disturbance: Secondary | ICD-10-CM | POA: Diagnosis present

## 2019-10-23 DIAGNOSIS — K219 Gastro-esophageal reflux disease without esophagitis: Secondary | ICD-10-CM | POA: Diagnosis present

## 2019-10-23 DIAGNOSIS — Z7952 Long term (current) use of systemic steroids: Secondary | ICD-10-CM

## 2019-10-23 DIAGNOSIS — Z87891 Personal history of nicotine dependence: Secondary | ICD-10-CM

## 2019-10-23 DIAGNOSIS — J9601 Acute respiratory failure with hypoxia: Secondary | ICD-10-CM

## 2019-10-23 DIAGNOSIS — D72829 Elevated white blood cell count, unspecified: Secondary | ICD-10-CM | POA: Diagnosis present

## 2019-10-23 DIAGNOSIS — R0902 Hypoxemia: Secondary | ICD-10-CM | POA: Diagnosis not present

## 2019-10-23 DIAGNOSIS — A4152 Sepsis due to Pseudomonas: Secondary | ICD-10-CM | POA: Diagnosis not present

## 2019-10-23 DIAGNOSIS — N390 Urinary tract infection, site not specified: Secondary | ICD-10-CM | POA: Diagnosis present

## 2019-10-23 DIAGNOSIS — R6520 Severe sepsis without septic shock: Secondary | ICD-10-CM | POA: Diagnosis present

## 2019-10-23 DIAGNOSIS — M353 Polymyalgia rheumatica: Secondary | ICD-10-CM | POA: Diagnosis present

## 2019-10-23 DIAGNOSIS — Z79899 Other long term (current) drug therapy: Secondary | ICD-10-CM

## 2019-10-23 DIAGNOSIS — Z79890 Hormone replacement therapy (postmenopausal): Secondary | ICD-10-CM

## 2019-10-23 DIAGNOSIS — E875 Hyperkalemia: Secondary | ICD-10-CM | POA: Diagnosis present

## 2019-10-23 DIAGNOSIS — J9811 Atelectasis: Secondary | ICD-10-CM | POA: Diagnosis not present

## 2019-10-23 DIAGNOSIS — I480 Paroxysmal atrial fibrillation: Secondary | ICD-10-CM | POA: Diagnosis present

## 2019-10-23 DIAGNOSIS — E039 Hypothyroidism, unspecified: Secondary | ICD-10-CM | POA: Diagnosis present

## 2019-10-23 DIAGNOSIS — Z66 Do not resuscitate: Secondary | ICD-10-CM | POA: Diagnosis present

## 2019-10-23 DIAGNOSIS — Z8711 Personal history of peptic ulcer disease: Secondary | ICD-10-CM

## 2019-10-23 DIAGNOSIS — J9602 Acute respiratory failure with hypercapnia: Secondary | ICD-10-CM | POA: Diagnosis present

## 2019-10-23 DIAGNOSIS — J90 Pleural effusion, not elsewhere classified: Secondary | ICD-10-CM | POA: Diagnosis present

## 2019-10-23 DIAGNOSIS — G9341 Metabolic encephalopathy: Secondary | ICD-10-CM | POA: Diagnosis present

## 2019-10-23 DIAGNOSIS — K7200 Acute and subacute hepatic failure without coma: Secondary | ICD-10-CM | POA: Diagnosis present

## 2019-10-23 DIAGNOSIS — J449 Chronic obstructive pulmonary disease, unspecified: Secondary | ICD-10-CM | POA: Diagnosis present

## 2019-10-23 DIAGNOSIS — Z7901 Long term (current) use of anticoagulants: Secondary | ICD-10-CM | POA: Diagnosis not present

## 2019-10-23 DIAGNOSIS — M069 Rheumatoid arthritis, unspecified: Secondary | ICD-10-CM | POA: Diagnosis present

## 2019-10-23 DIAGNOSIS — Z8249 Family history of ischemic heart disease and other diseases of the circulatory system: Secondary | ICD-10-CM

## 2019-10-23 DIAGNOSIS — Z803 Family history of malignant neoplasm of breast: Secondary | ICD-10-CM

## 2019-10-23 DIAGNOSIS — Z882 Allergy status to sulfonamides status: Secondary | ICD-10-CM

## 2019-10-23 DIAGNOSIS — Z825 Family history of asthma and other chronic lower respiratory diseases: Secondary | ICD-10-CM

## 2019-10-23 LAB — CK: Total CK: 104 U/L (ref 38–234)

## 2019-10-23 LAB — CBC WITH DIFFERENTIAL/PLATELET
Abs Immature Granulocytes: 0.22 10*3/uL — ABNORMAL HIGH (ref 0.00–0.07)
Basophils Absolute: 0 10*3/uL (ref 0.0–0.1)
Basophils Relative: 0 %
Eosinophils Absolute: 0.1 10*3/uL (ref 0.0–0.5)
Eosinophils Relative: 0 %
HCT: 33 % — ABNORMAL LOW (ref 36.0–46.0)
Hemoglobin: 10.2 g/dL — ABNORMAL LOW (ref 12.0–15.0)
Immature Granulocytes: 1 %
Lymphocytes Relative: 1 %
Lymphs Abs: 0.2 10*3/uL — ABNORMAL LOW (ref 0.7–4.0)
MCH: 31.5 pg (ref 26.0–34.0)
MCHC: 30.9 g/dL (ref 30.0–36.0)
MCV: 101.9 fL — ABNORMAL HIGH (ref 80.0–100.0)
Monocytes Absolute: 1.2 10*3/uL — ABNORMAL HIGH (ref 0.1–1.0)
Monocytes Relative: 6 %
Neutro Abs: 20 10*3/uL — ABNORMAL HIGH (ref 1.7–7.7)
Neutrophils Relative %: 92 %
Platelets: 188 10*3/uL (ref 150–400)
RBC: 3.24 MIL/uL — ABNORMAL LOW (ref 3.87–5.11)
RDW: 13.8 % (ref 11.5–15.5)
WBC: 21.7 10*3/uL — ABNORMAL HIGH (ref 4.0–10.5)
nRBC: 0 % (ref 0.0–0.2)

## 2019-10-23 LAB — URINALYSIS, ROUTINE W REFLEX MICROSCOPIC
Bacteria, UA: NONE SEEN
Bilirubin Urine: NEGATIVE
Glucose, UA: NEGATIVE mg/dL
Ketones, ur: NEGATIVE mg/dL
Nitrite: NEGATIVE
Protein, ur: NEGATIVE mg/dL
Specific Gravity, Urine: 1.014 (ref 1.005–1.030)
pH: 5 (ref 5.0–8.0)

## 2019-10-23 LAB — COMPREHENSIVE METABOLIC PANEL
ALT: 82 U/L — ABNORMAL HIGH (ref 0–44)
AST: 173 U/L — ABNORMAL HIGH (ref 15–41)
Albumin: 3 g/dL — ABNORMAL LOW (ref 3.5–5.0)
Alkaline Phosphatase: 119 U/L (ref 38–126)
Anion gap: 8 (ref 5–15)
BUN: 20 mg/dL (ref 8–23)
CO2: 25 mmol/L (ref 22–32)
Calcium: 7.7 mg/dL — ABNORMAL LOW (ref 8.9–10.3)
Chloride: 105 mmol/L (ref 98–111)
Creatinine, Ser: 1.03 mg/dL — ABNORMAL HIGH (ref 0.44–1.00)
GFR calc Af Amer: 57 mL/min — ABNORMAL LOW (ref >60)
GFR calc non Af Amer: 49 mL/min — ABNORMAL LOW (ref >60)
Glucose, Bld: 91 mg/dL (ref 70–99)
Potassium: 5.7 mmol/L — ABNORMAL HIGH (ref 3.5–5.1)
Sodium: 138 mmol/L (ref 135–145)
Total Bilirubin: 1.7 mg/dL — ABNORMAL HIGH (ref 0.3–1.2)
Total Protein: 5.8 g/dL — ABNORMAL LOW (ref 6.5–8.1)

## 2019-10-23 LAB — SARS CORONAVIRUS 2 BY RT PCR (HOSPITAL ORDER, PERFORMED IN ~~LOC~~ HOSPITAL LAB): SARS Coronavirus 2: NEGATIVE

## 2019-10-23 LAB — PROTIME-INR
INR: 1.5 — ABNORMAL HIGH (ref 0.8–1.2)
Prothrombin Time: 17.7 seconds — ABNORMAL HIGH (ref 11.4–15.2)

## 2019-10-23 LAB — LACTIC ACID, PLASMA
Lactic Acid, Venous: 2 mmol/L (ref 0.5–1.9)
Lactic Acid, Venous: 2.3 mmol/L (ref 0.5–1.9)
Lactic Acid, Venous: 1.4 mmol/L (ref 0.5–1.9)

## 2019-10-23 LAB — APTT: aPTT: 27 seconds (ref 24–36)

## 2019-10-23 MED ORDER — SENNOSIDES-DOCUSATE SODIUM 8.6-50 MG PO TABS: 1.0000 | ORAL_TABLET | Freq: Every evening | ORAL | Status: DC | PRN

## 2019-10-23 MED ORDER — IOHEXOL 9 MG/ML PO SOLN: 500.0000 mL | ORAL | Status: AC

## 2019-10-23 MED ORDER — LACTATED RINGERS IV BOLUS
500.0000 mL | Freq: Once | INTRAVENOUS | Status: AC
  Administered 2019-10-23: 500 mL via INTRAVENOUS

## 2019-10-23 MED ORDER — ONDANSETRON HCL 4 MG/2ML IJ SOLN: 4.0000 mg | Freq: Four times a day (QID) | INTRAMUSCULAR | Status: DC | PRN

## 2019-10-23 MED ORDER — VITAMIN D3 25 MCG PO TABS
1000.0000 [IU] | ORAL_TABLET | Freq: Every day | ORAL | Status: DC
  Filled 2019-10-23: qty 1

## 2019-10-23 MED ORDER — IPRATROPIUM-ALBUTEROL 0.5-2.5 (3) MG/3ML IN SOLN: 3.0000 mL | RESPIRATORY_TRACT | Status: DC | PRN

## 2019-10-23 MED ORDER — SODIUM CHLORIDE (PF) 0.9 % IJ SOLN
INTRAMUSCULAR | Status: AC
  Filled 2019-10-23: qty 50

## 2019-10-23 MED ORDER — LACTATED RINGERS IV BOLUS
1000.0000 mL | Freq: Once | INTRAVENOUS | Status: AC
  Administered 2019-10-23: 1000 mL via INTRAVENOUS

## 2019-10-23 MED ORDER — ONDANSETRON HCL 4 MG PO TABS: 4.0000 mg | ORAL_TABLET | Freq: Four times a day (QID) | ORAL | Status: DC | PRN

## 2019-10-23 MED ORDER — SODIUM CHLORIDE 0.9 % IV BOLUS: 1000.0000 mL | Freq: Once | INTRAVENOUS | Status: DC

## 2019-10-23 MED ORDER — SODIUM CHLORIDE 0.9 % IV SOLN
2.0000 g | Freq: Once | INTRAVENOUS | Status: AC
  Administered 2019-10-23: 2 g via INTRAVENOUS
  Filled 2019-10-23: qty 2

## 2019-10-23 MED ORDER — UMECLIDINIUM BROMIDE 62.5 MCG/INH IN AEPB
1.0000 | INHALATION_SPRAY | Freq: Every day | RESPIRATORY_TRACT | Status: DC
  Administered 2019-10-25 – 2019-10-26 (×2): 1 via RESPIRATORY_TRACT
  Filled 2019-10-23: qty 7

## 2019-10-23 MED ORDER — PREDNISONE 1 MG PO TABS
3.0000 mg | ORAL_TABLET | Freq: Every day | ORAL | Status: DC
  Administered 2019-10-23 – 2019-10-26 (×4): 3 mg via ORAL
  Filled 2019-10-23 (×4): qty 3

## 2019-10-23 MED ORDER — ACETAMINOPHEN 500 MG PO TABS
1000.0000 mg | ORAL_TABLET | Freq: Once | ORAL | Status: AC
  Administered 2019-10-23: 1000 mg via ORAL

## 2019-10-23 MED ORDER — IOHEXOL 9 MG/ML PO SOLN
ORAL | Status: AC
  Administered 2019-10-23: 500 mL
  Filled 2019-10-23: qty 500

## 2019-10-23 MED ORDER — IOHEXOL 300 MG/ML  SOLN
80.0000 mL | Freq: Once | INTRAMUSCULAR | Status: AC | PRN
  Administered 2019-10-23: 80 mL via INTRAVENOUS

## 2019-10-23 MED ORDER — SODIUM CHLORIDE 0.9 % IV SOLN
1.0000 g | INTRAVENOUS | Status: DC
  Administered 2019-10-23 – 2019-10-24 (×2): 1 g via INTRAVENOUS
  Filled 2019-10-23 (×2): qty 10

## 2019-10-23 MED ORDER — PANTOPRAZOLE SODIUM 40 MG PO TBEC
40.0000 mg | DELAYED_RELEASE_TABLET | Freq: Every day | ORAL | Status: DC
  Administered 2019-10-23 – 2019-10-26 (×4): 40 mg via ORAL
  Filled 2019-10-23 (×4): qty 1

## 2019-10-23 MED ORDER — SODIUM CHLORIDE 0.9 % IV SOLN
500.0000 mg | INTRAVENOUS | Status: DC
  Administered 2019-10-23: 500 mg via INTRAVENOUS
  Filled 2019-10-23 (×2): qty 500

## 2019-10-23 MED ORDER — ADULT MULTIVITAMIN W/MINERALS CH
1.0000 | ORAL_TABLET | Freq: Every day | ORAL | Status: DC
  Administered 2019-10-23 – 2019-10-26 (×4): 1 via ORAL
  Filled 2019-10-23 (×4): qty 1

## 2019-10-23 MED ORDER — APIXABAN 2.5 MG PO TABS
2.5000 mg | ORAL_TABLET | Freq: Two times a day (BID) | ORAL | Status: DC
  Administered 2019-10-23 – 2019-10-26 (×6): 2.5 mg via ORAL
  Filled 2019-10-23 (×6): qty 1

## 2019-10-23 MED ORDER — AMITRIPTYLINE HCL 25 MG PO TABS
50.0000 mg | ORAL_TABLET | Freq: Every day | ORAL | Status: DC
  Administered 2019-10-23 – 2019-10-25 (×3): 50 mg via ORAL
  Filled 2019-10-23 (×4): qty 2

## 2019-10-23 MED ORDER — CARVEDILOL 12.5 MG PO TABS
12.5000 mg | ORAL_TABLET | Freq: Two times a day (BID) | ORAL | Status: DC
  Administered 2019-10-23 – 2019-10-26 (×7): 12.5 mg via ORAL
  Filled 2019-10-23 (×7): qty 1

## 2019-10-23 MED ORDER — LEVALBUTEROL TARTRATE 45 MCG/ACT IN AERO: 1.0000 | INHALATION_SPRAY | RESPIRATORY_TRACT | Status: DC | PRN

## 2019-10-23 MED ORDER — IBUPROFEN 200 MG PO TABS
400.0000 mg | ORAL_TABLET | Freq: Four times a day (QID) | ORAL | Status: DC | PRN
  Administered 2019-10-24 – 2019-10-25 (×2): 400 mg via ORAL
  Filled 2019-10-23 (×2): qty 2

## 2019-10-23 MED ORDER — LEVOTHYROXINE SODIUM 50 MCG PO TABS
75.0000 ug | ORAL_TABLET | Freq: Every day | ORAL | Status: DC
  Administered 2019-10-24 – 2019-10-26 (×2): 75 ug via ORAL
  Filled 2019-10-23 (×2): qty 1

## 2019-10-23 MED ORDER — SODIUM CHLORIDE 0.9 % IV SOLN: Freq: Once | INTRAVENOUS | Status: AC

## 2019-10-23 MED ORDER — FUROSEMIDE 40 MG PO TABS: 20.0000 mg | ORAL_TABLET | Freq: Every day | ORAL | Status: DC | PRN

## 2019-10-23 NOTE — ED Triage Notes (Signed)
EMS reports from home, called out for suspected UTI per daughter, Hx of frequent UTI. This episode for two days now. Hx COPD  BP 116/58 HR 110 RR24 Sp02 84 RA 98 on 3lts  22ga L hand NS enroute

## 2019-10-23 NOTE — ED Provider Notes (Signed)
[facility] DEPT Provider Note   CSN: [id number] Arrival date & time: 10/23/19  1102     History Chief Complaint  Patient presents with  . Suspected UTI    ELLOISE Carrillo is a 86 y.o. female with a past medical history of hypertension, polymyalgia rheumatica, COPD, frequent UTI, A. fib anticoagulated with Eliquis, CHF, who presents today for evaluation of possible UTI.  History primarily obtained from patient, her daughter, chart review.  Daughter reports that this is patient's fourth visit to the hospital for UTIs since April.  She states that last night patient told her that she had a urinary tract infection and had for "a few days."  She only informed her daughter of this last night.  Daughter reports that today she did not get up out of bed as normal and has been less interactive.  She reports that she had fevers at home over 100.2.  Daughter reports that she has had both coronavirus vaccines.  No new cough.  She states that frequently when she is in the hospital they put her on oxygen however she normally runs 93 to 94% at home on room air.    No known recent sick contacts.    Daughter reports that at baseline patient is oriented to self, not always to place or time.   HPI     Past Medical History:  Diagnosis Date  . Bronchiectasis   . COPD (chronic obstructive pulmonary disease) ([place])   . Degenerative disk disease   . Eczema   . Gastric ulcer   . GERD (gastroesophageal reflux disease)   . Hiatal hernia   . Hyperlipidemia   . Hypertension   . Hypothyroidism   . Osteoarthritis   . Polymyalgia rheumatica ([place])   . Pulmonary nodule, right   . Vertebrobasilar insufficiency     Patient Active Problem List   Diagnosis Date Noted  . Sepsis secondary to UTI ([place]) 09/21/2019  . Proteus mirabilis infection 09/18/2019  . Acute lower UTI 09/18/2019  . Metabolic encephalopathy 25/85/2778  . Acute metabolic encephalopathy 24/23/5361  . Leukocytosis  09/01/2019  . Acute systolic heart failure ([place]) 01/28/2019  . Atrial fibrillation ([place])   . Current chronic use of systemic steroids 01/25/2019  . Encephalopathy 01/25/2019  . Fall from slip, trip, or stumble, initial encounter   . Generalized weakness   . Sepsis due to pneumonia ([place]) 01/24/2019  . Bronchiectasis with (acute) exacerbation ([place]) 12/20/2018  . Lobar pneumonia, unspecified organism ([place]) 02/15/2018  . Nuclear sclerosis of both eyes 03/05/2016  . Glaucoma suspect, bilateral 03/05/2016  . CAP (community acquired pneumonia) 11/12/2015  . Pulmonary nodules 10/12/2015  . Cough 02/03/2014  . Macular degeneration, left eye 12/28/2013  . Glaucoma suspect, both eyes 12/28/2013  . Allergic rhinitis 11/12/2012  . Nuclear cataract 08/08/2011  . BRONCHIECTASIS 03/09/2009  . COPD (chronic obstructive pulmonary disease) ([place]) 03/09/2009  . Essential hypertension 01/31/2009  . Polymyalgia rheumatica ([place]) 01/31/2009    Past Surgical History:  Procedure Laterality Date  . BREAST CYST EXCISION     right  . broken tail bone    . CAROTID ENDARTERECTOMY     right  . RHINOPLASTY    . TOTAL ABDOMINAL HYSTERECTOMY       OB History   No obstetric history on file.     Family History  Problem Relation Age of Onset  . COPD Mother   . Breast cancer Mother   . Emphysema Mother   . Liver disease  Son   . Emphysema Father   . Heart attack Father     Social History   Tobacco Use  . Smoking status: Former Smoker    Packs/day: 2.00    Years: 2.00    Pack years: 4.00    Types: Cigarettes    Quit date: 05/19/1958    Years since quitting: 61.4  . Smokeless tobacco: Never Used  Substance Use Topics  . Alcohol use: No  . Drug use: Never    Home Medications Prior to Admission medications   Medication Sig Start Date End Date Taking? Authorizing Provider  amitriptyline (ELAVIL) 50 MG tablet Take 50 mg by mouth at bedtime.   Yes [provider]  apixaban (ELIQUIS) 2.5  MG TABS tablet Take 1 tablet (2.5 mg total) by mouth 2 (two) times daily. 04/07/19  Yes O'Neal, Ronnald Ramp, MD  carvedilol (COREG) 12.5 MG tablet Take 1 tablet (12.5 mg total) by mouth 2 (two) times daily with a meal. 04/07/19  Yes O'Neal, Ronnald Ramp, MD  cholecalciferol (VITAMIN D) 25 MCG tablet Take 1 tablet (1,000 Units total) by mouth daily. 09/25/19  Yes Almon Hercules, MD  furosemide (LASIX) 20 MG tablet Take 1 tablet (20 mg total) by mouth daily as needed for fluid or edema (Weight gain of more than 3 pounds in 1 day or 5 pounds in 2 days). 01/30/19 01/30/20 Yes Rolly Salter, MD  ipratropium-albuterol (DUONEB) 0.5-2.5 (3) MG/3ML SOLN Inhale 3 mLs into the lungs 2 (two) times daily as needed for cough. 08/08/19  Yes [provider]  levalbuterol (XOPENEX HFA) 45 MCG/ACT inhaler Inhale 1 puff into the lungs every 4 (four) hours as needed for wheezing. 01/30/19 01/30/20 Yes Rolly Salter, MD  levothyroxine (SYNTHROID) 75 MCG tablet Take 1 tablet (75 mcg total) by mouth daily at 6 (six) AM. 01/31/19  Yes Rolly Salter, MD  Multiple Vitamin (MULTIVITAMIN) tablet Take 1 tablet by mouth daily.     Yes [provider]  omeprazole (PRILOSEC) 20 MG capsule Take 20 mg by mouth daily.    Yes [provider]  predniSONE (DELTASONE) 1 MG tablet Take 3 mg by mouth daily.    Yes [provider]  tiotropium (SPIRIVA HANDIHALER) 18 MCG inhalation capsule PLACE 1 CAPSULE INTO THE INHALER AND INHALE DAILY Patient taking differently: Place 18 mcg into inhaler and inhale daily.  08/01/19  Yes Parrett, Tammy S, NP  amoxicillin-clavulanate (AUGMENTIN) 500-125 MG tablet Take 1 tablet (500 mg total) by mouth 3 (three) times daily. Patient not taking: Reported on 10/23/2019 09/24/19   Almon Hercules, MD  Spacer/Aero-Holding Chambers (AEROCHAMBER MV) inhaler Use as instructed 08/01/19   Parrett, Virgel Bouquet, NP    Allergies    Sulfonamide derivatives  Review of Systems   Review of  Systems  Unable to perform ROS: Mental status change    Physical Exam Updated Vital Signs BP (!) 134/58   Pulse 96   Temp (!) 101.8 F (38.8 C) (Rectal)   Resp (!) 26   SpO2 91%   Physical Exam Vitals and nursing note reviewed.  Constitutional:      Appearance: She is well-developed. She is ill-appearing.  HENT:     Head: Normocephalic and atraumatic.  Eyes:     Conjunctiva/sclera: Conjunctivae normal.  Cardiovascular:     Rate and Rhythm: Regular rhythm. Tachycardia present.     Pulses: Normal pulses.     Heart sounds: Normal heart sounds. No murmur.  Pulmonary:  Effort: Pulmonary effort is normal. Tachypnea present. No respiratory distress.     Comments: Bilaterally diffuse crackles.  Abdominal:     General: There is no distension.     Palpations: Abdomen is soft.     Tenderness: There is no abdominal tenderness. There is no guarding.  Musculoskeletal:     Cervical back: Neck supple.  Skin:    General: Skin is warm and dry.  Neurological:     Mental Status: She is alert.     Comments: Patient is oriented to place, not to place or time.   Psychiatric:        Mood and Affect: Mood normal.        Behavior: Behavior normal.     ED Results / Procedures / Treatments   Labs (all labs ordered are listed, but only abnormal results are displayed) Labs Reviewed  COMPREHENSIVE METABOLIC PANEL - Abnormal; Notable for the following components:      Result Value   Potassium 5.7 (*)    Creatinine, Ser 1.03 (*)    Calcium 7.7 (*)    Total Protein 5.8 (*)    Albumin 3.0 (*)    AST 173 (*)    ALT 82 (*)    Total Bilirubin 1.7 (*)    GFR calc non Af Amer 49 (*)    GFR calc Af Amer 57 (*)    All other components within normal limits  CBC WITH DIFFERENTIAL/PLATELET - Abnormal; Notable for the following components:   WBC 21.7 (*)    RBC 3.24 (*)    Hemoglobin 10.2 (*)    HCT 33.0 (*)    MCV 101.9 (*)    Neutro Abs 20.0 (*)    Lymphs Abs 0.2 (*)    Monocytes  Absolute 1.2 (*)    Abs Immature Granulocytes 0.22 (*)    All other components within normal limits  URINALYSIS, ROUTINE W REFLEX MICROSCOPIC - Abnormal; Notable for the following components:   Hgb urine dipstick MODERATE (*)    Leukocytes,Ua TRACE (*)    All other components within normal limits  LACTIC ACID, PLASMA - Abnormal; Notable for the following components:   Lactic Acid, Venous 2.0 (*)    All other components within normal limits  LACTIC ACID, PLASMA - Abnormal; Notable for the following components:   Lactic Acid, Venous 2.3 (*)    All other components within normal limits  PROTIME-INR - Abnormal; Notable for the following components:   Prothrombin Time 17.7 (*)    INR 1.5 (*)    All other components within normal limits  SARS CORONAVIRUS 2 BY RT PCR (HOSPITAL ORDER, PERFORMED IN [facility])  URINE CULTURE  CULTURE, BLOOD (ROUTINE X 2)  CULTURE, BLOOD (ROUTINE X 2)  APTT  CK  BRAIN NATRIURETIC PEPTIDE  POTASSIUM    EKG EKG Interpretation  Date/Time:  Sunday October 23 2019 11:56:27 EDT Ventricular Rate:  102 PR Interval:    QRS Duration: 78 QT Interval:  322 QTC Calculation: 420 R Axis:   47 Text Interpretation: Sinus tachycardia Confirmed by Marianna Fuss ([id number]) on 10/23/2019 12:39:00 PM   Radiology CT Chest W Contrast  Result Date: 10/23/2019 CLINICAL DATA:  Suspected UTI. EXAM: CT CHEST, ABDOMEN, AND PELVIS WITH CONTRAST TECHNIQUE: Multidetector CT imaging of the chest, abdomen and pelvis was performed following the standard protocol during bolus administration of intravenous contrast. CONTRAST:  75mL OMNIPAQUE IOHEXOL 300 MG/ML  SOLN COMPARISON:  January 24, 2019 FINDINGS: CT CHEST FINDINGS Cardiovascular:  There is moderate severity calcification of the thoracic aorta. Normal heart size. No pericardial effusion. Moderate severity coronary artery calcification is seen. Mediastinum/Nodes: No enlarged mediastinal, hilar, or axillary lymph nodes.  Lungs/Pleura: A 6 mm predominately calcified lung nodule is seen within the posterior lateral aspect of the right upper lobe. Stable mild to moderate severity right middle lobe scarring and atelectasis is seen. Very mild areas of atelectasis are seen within the bilateral lung bases. Very small bilateral pleural effusions are seen. No pneumothorax is identified. Musculoskeletal: Multilevel degenerative changes seen throughout the thoracic spine. CT ABDOMEN PELVIS FINDINGS Hepatobiliary: No focal liver abnormality is seen. No gallstones, gallbladder wall thickening, or biliary dilatation. Pancreas: Unremarkable. No pancreatic ductal dilatation or surrounding inflammatory changes. Spleen: Normal in size without focal abnormality. Adrenals/Urinary Tract: Adrenal glands are unremarkable. Kidneys are normal, without renal calculi, focal lesion, or hydronephrosis. Bladder is unremarkable. Stomach/Bowel: Stomach is within normal limits. The appendix is not clearly identified. No evidence of bowel wall thickening, distention, or inflammatory changes. Vascular/Lymphatic: There is marked severity calcification of the abdominal aorta and bilateral common iliac arteries. No enlarged abdominal or pelvic lymph nodes. Reproductive: Status post hysterectomy. No adnexal masses. Other: No abdominal wall hernia or abnormality. No abdominopelvic ascites. Musculoskeletal: There is grade 1 anterolisthesis of the L5 vertebral body on S1. Multilevel degenerative changes seen throughout the lumbar spine. IMPRESSION: 1. Very small bilateral pleural effusions. 2. Stable mild to moderate severity right middle lobe scarring and atelectasis. 3. Stable 6 mm predominately calcified lung nodule within the posterior lateral aspect of the right upper lobe. 4. Marked severity calcification of the abdominal aorta and bilateral common iliac arteries. 5. Grade 1 anterolisthesis of the L5 vertebral body on S1. Aortic Atherosclerosis (ICD10-I70.0).  Electronically Signed   By: Aram Candela M.D.   On: 10/23/2019 15:30   CT Abdomen Pelvis W Contrast  Result Date: 10/23/2019 CLINICAL DATA:  Recurring UTIs. EXAM: CT CHEST, ABDOMEN, AND PELVIS WITH CONTRAST TECHNIQUE: Multidetector CT imaging of the chest, abdomen and pelvis was performed following the standard protocol during bolus administration of intravenous contrast. CONTRAST:  72mL OMNIPAQUE IOHEXOL 300 MG/ML  SOLN COMPARISON:  None. FINDINGS: CT CHEST FINDINGS Cardiovascular: There is moderate severity calcification of the thoracic aorta. Normal heart size. No pericardial effusion. Moderate severity coronary artery calcification is seen. Mediastinum/Nodes: No enlarged mediastinal, hilar, or axillary lymph nodes. Lungs/Pleura: A 6 mm predominately calcified lung nodule is seen within the posterior lateral aspect of the right upper lobe. Stable mild to moderate severity right middle lobe scarring and atelectasis is seen. Very mild areas of atelectasis are seen within the bilateral lung bases. Very small bilateral pleural effusions are seen. No pneumothorax is identified. Musculoskeletal: Multilevel degenerative changes seen throughout the thoracic spine. CT ABDOMEN PELVIS FINDINGS Hepatobiliary: No focal liver abnormality is seen. No gallstones, gallbladder wall thickening, or biliary dilatation. Pancreas: Unremarkable. No pancreatic ductal dilatation or surrounding inflammatory changes. Spleen: Normal in size without focal abnormality. Adrenals/Urinary Tract: Adrenal glands are unremarkable. Kidneys are normal, without renal calculi, focal lesion, or hydronephrosis. Bladder is unremarkable. Stomach/Bowel: Stomach is within normal limits. The appendix is not clearly identified. No evidence of bowel wall thickening, distention, or inflammatory changes. Vascular/Lymphatic: There is marked severity calcification of the abdominal aorta and bilateral common iliac arteries. No enlarged abdominal or pelvic  lymph nodes. Reproductive: Status post hysterectomy. No adnexal masses. Other: No abdominal wall hernia or abnormality. No abdominopelvic ascites. Musculoskeletal: There is grade 1 anterolisthesis of the L5 vertebral body  on S1. Multilevel degenerative changes seen throughout the lumbar spine. IMPRESSION: 1. Very small bilateral pleural effusions. 2. Stable mild to moderate severity right middle lobe scarring and atelectasis. 3. Stable 6 mm predominately calcified lung nodule within the posterior lateral aspect of the right upper lobe. 4. Marked severity calcification of the abdominal aorta and bilateral common iliac arteries. 5. Grade 1 anterolisthesis of the L5 vertebral body on S1. Aortic Atherosclerosis (ICD10-I70.0). Electronically Signed   By: Aram Candela M.D.   On: 10/23/2019 15:31   DG Chest Port 1 View  Result Date: 10/23/2019 CLINICAL DATA:  Hypoxia EXAM: PORTABLE CHEST 1 VIEW COMPARISON:  10/04/2019 FINDINGS: The heart size and mediastinal contours are within normal limits. Benign calcified nodule of the right upper lobe. Mild, diffuse interstitial pulmonary opacity. Probable trace pleural effusions. The visualized skeletal structures are unremarkable. IMPRESSION: Mild, diffuse interstitial pulmonary opacity and probable trace pleural effusions, consistent with edema or infection. No focal airspace opacity. Electronically Signed   By: Lauralyn Primes M.D.   On: 10/23/2019 11:46    Procedures .Critical Care Performed by: Cristina Gong, PA-C Authorized by: Cristina Gong, PA-C   Critical care provider statement:    Critical care time (minutes):  45   Critical care time was exclusive of:  Separately billable procedures and treating other patients   Critical care was necessary to treat or prevent imminent or life-threatening deterioration of the following conditions:  Sepsis   Critical care was time spent personally by me on the following activities:  Discussions with  consultants, evaluation of patient's response to treatment, examination of patient, ordering and performing treatments and interventions, ordering and review of laboratory studies, ordering and review of radiographic studies, pulse oximetry, re-evaluation of patient's condition, obtaining history from patient or surrogate and review of old charts   (including critical care time)  Medications Ordered in ED Medications  iohexol (OMNIPAQUE) 9 MG/ML oral solution 500 mL (has no administration in time range)  sodium chloride (PF) 0.9 % injection (has no administration in time range)  ceFEPIme (MAXIPIME) 2 g in sodium chloride 0.9 % 100 mL IVPB (0 g Intravenous Stopped 10/23/19 1319)  lactated ringers bolus 500 mL (0 mLs Intravenous Stopped 10/23/19 1319)  acetaminophen (TYLENOL) tablet 1,000 mg (1,000 mg Oral Given 10/23/19 1319)  iohexol (OMNIPAQUE) 9 MG/ML oral solution (500 mLs  Contrast Given 10/23/19 1421)  iohexol (OMNIPAQUE) 300 MG/ML solution 80 mL (80 mLs Intravenous Contrast Given 10/23/19 1503)  lactated ringers bolus 1,000 mL (1,000 mLs Intravenous New Bag/Given 10/23/19 1551)    ED Course  I have reviewed the triage vital signs and the nursing notes.  Pertinent labs & imaging results that were available during my care of the patient were reviewed by me and considered in my medical decision making (see chart for details).  Clinical Course as of Oct 23 1554  Sun Oct 23, 2019  1127 In room I personally counted respiratory rate at 28 breaths per minute.  With tachycardia and suspected UTI Code sepsis.    [EH]  1134 Her daughter reports this is her 4th visit for a UTI.  Last night Patient told her daughter she has a UTI. She didn't get out of bed, was 102.1, no oxygen at home, Normally 93-94 on room air.  Daughter reports that normally when she has a UTI her stomach hurts.  She has not had any antipyretics today.  She has not been coughing more than usual.  Daughter reports she is often unable  to  give correct yes/no answers.  She knows who she is but is disoriented to time at baseline.  OK to in and out.     [EH]  1223 Lactic Acid, Venous(!!): 2.0 [EH]  1223 Lab was drawn off IV.  Messaged RN, asked to redraw with a fresh sample.   Potassium(!): 5.7 [EH]    Clinical Course User Index [EH] Norman Clay   MDM Rules/Calculators/A&P                      SARAH BAEZ is a 86 year old woman who presents today for concern of urinary tract infection.  She is altered at baseline.  On my evaluation she is tachycardic and tachypneic and febrile.  Code sepsis was called.  CBC shows leukocytosis at 21.7.  Urine is not clearly convincing for UTI.  Her and potassium was elevated, redraw was ordered.  Chest x-ray showed concern for diffuse opacities.  As patient had reported abdominal pain CT chest abdomen pelvis was obtained especially as she appears to be newly hypoxic.  No significant acute abnormalities found.  Patient will be admitted to the hospital for continued IV antibiotics.  Blood cultures were obtained prior to antibiotics.    Note: Portions of this report may have been transcribed using voice recognition software. Every effort was made to ensure accuracy; however, inadvertent computerized transcription errors may be present  Final Clinical Impression(s) / ED Diagnoses Final diagnoses:  Sepsis, due to unspecified organism, unspecified whether acute organ dysfunction present [facility])    Rx / DC Orders ED Discharge Orders    None       Norman Clay 10/23/19 2153    Milagros Loll, MD 10/26/19 [phone]

## 2019-10-23 NOTE — Progress Notes (Signed)
A consult was received from an ED physician for cefepime per pharmacy dosing (for an indication other than meningitis). The patient's profile has been reviewed for ht/wt/allergies/indication/available labs. A one time order has been placed for the above antibiotics.  Further antibiotics/pharmacy consults should be ordered by admitting physician if indicated.                       Bernadene Person, PharmD, BCPS [phone] 10/23/2019, 11:58 AM

## 2019-10-23 NOTE — ED Notes (Addendum)
Date and time results received: 10/23/19 12:21 PM   Test: lactic  Critical Value: 2.0  Name of Provider Notified: R DYKSTRA & Jeraldine Loots PA

## 2019-10-23 NOTE — Progress Notes (Signed)
Pt arrived to unit via stretcher room 1539. Alert to self. callbell within reach and bed alarm on. Initial assessment completed and daughter [name] contacted for admission history .will continue to monitor.

## 2019-10-23 NOTE — Progress Notes (Signed)
Notified provider and bedside nurse of need to order repeat lactic acid.  Second lactic resulted higher than previous have asked MD to consider ordering a 3rd lactic.

## 2019-10-23 NOTE — H&P (Addendum)
History and Physical    Jenna Carrillo NGE:[id number] DOB: 08/04/1930 DOA: 10/23/2019  PCP: Shon Baton, MD   Chief Complaint: Flank pain  HPI: Jenna Carrillo is a 86 y.o. female with medical history significant of COPD on RA, PAF on eliquis, PMR on prednisone, recurrent UTIs, dementia, hypothyroidism, HTN, HLD and GERD brought to ED due to flank pain and loss of bladder function at home where she urinated on her own floor at home as she was unable to hold her urine.  Patient was just admitted to our facility 1 month ago for similar episode of flank pain and diagnosed with UTI at that time.  Patient denies any shortness of breath, chest pain, nausea, vomiting, constipation, diarrhea.  Review of systems and history is somewhat limited due to patient's baseline dementia.  Daughter Butch Penny indicates patient is "not herself" and is acting quite similarly to last time she had a UTI approximately 1 month ago when she was at our facility.  ED Course: In the ED patient met criteria for sepsis due to fever, tachycardia, tachypnea and presumed source of UTI.  Her routine lab work concerning for elevated lactic acidosis, abnormal UA with trace leukocytes, leukocytosis at 22, anemia stable at 10.2 that is macrocytic in nature with hyperkalemia and minimally elevated AST ALT and T bili.  Patient also had what appears to be acute hypoxic respiratory failure, CT chest remarkable for right middle lung opacification as well as scant bibasilar effusions. Given sepsis criteria met in the setting of acute infection of likely 2 possible sources hospitalist was called to admit.  Review of Systems: As per HPI somewhat limited due to patient's mental status.   Assessment/Plan Principal Problem:   Sepsis due to pneumonia [facility]) Active Problems:   Essential hypertension   COPD (chronic obstructive pulmonary disease) (HCC)   Polymyalgia rheumatica (HCC)   AF (paroxysmal atrial fibrillation) (HCC)   Acute metabolic  encephalopathy   Leukocytosis   Acute lower UTI   Acute respiratory failure with hypoxia and hypercapnia (HCC)   Sepsis, multifactorial in the setting of UTI, CAP vs aspiration pneumonia, POA  - tachycardia, leukocytosis, tachypnea with RML infiltrate on CT chest as well as concerning UA and flank pain concerning for CAP and UTI respectively  - Azithromycin/ceftriaxone x5 days to cover both commune acquired pneumonia and UTI.  - Patient likely at risk for aspiration as well, will have speech evaluate patient but patient and family declined any coughing events during p.o. intake  - Continue IV fluids supportive care, incentive spirometry, flutter, nebs -hold off on steroids as below given physical exam is without wheeze  Acute hypoxic respiratory failure 2/2 above Rule out COPD exacerbation  - Continue albuterol nebs, incentive spirometry/flutter  - Continue home meds includingspiriva/xopenex/prednisone   - Consider methylprednisolone if not improving with routine inhalers - no wheeze currently  Lactic acidosis in the setting of sepsis as above  - Continue IV fluids  Shock liver in the setting of sepsis   - Moderately elevated AST ALT consistent with the last admission 1 month ago for similar event  - Continue IV fluids follow morning labs, expect downtrend as volume resuscitation continues.  Hyperkalemia, mild  - Likely hemoconcentration, continue IV fluids, EKG without overt findings, no indication for acute treatment at this time.  PAF on eliquis - rate controlled  - Resume Eliquis, rate currently controlled on home medications including carvedilol  - Continue telemetry  Dementia, questionable acute metabolic encephalopathy 2/2 above  - Continue  amitriptyline  - Alert/oriented to person only - similar today but "off" per family without specifics  HTN  - Continue carvedilol, lasix  HLD  - Diet controlled - no statin on med rec  Hypothyroidism  - Continue home  levothyroxine  DVT prophylaxis: Eliquis  Code Status: DNR Family Communication: Daughter  Status is: Inpatient  Dispo: The patient is from: Home with daughter              Anticipated d/c is to: Same              Anticipated d/c date is: 38-72h              Patient currently NOT medically stable for discharge given sepsis with new onset hypoxia, mental status changes, need for IV fluids and antibiotics  Consultants:   None  Procedures:   None planned   Past Medical History:  Diagnosis Date  . Bronchiectasis   . COPD (chronic obstructive pulmonary disease) ([place])   . Degenerative disk disease   . Eczema   . Gastric ulcer   . GERD (gastroesophageal reflux disease)   . Hiatal hernia   . Hyperlipidemia   . Hypertension   . Hypothyroidism   . Osteoarthritis   . Polymyalgia rheumatica ([place])   . Pulmonary nodule, right   . Vertebrobasilar insufficiency     Past Surgical History:  Procedure Laterality Date  . BREAST CYST EXCISION     right  . broken tail bone    . CAROTID ENDARTERECTOMY     right  . RHINOPLASTY    . TOTAL ABDOMINAL HYSTERECTOMY       reports that she quit smoking about 61 years ago. Her smoking use included cigarettes. She has a 4.00 pack-year smoking history. She has never used smokeless tobacco. She reports that she does not drink alcohol or use drugs.  Allergies  Allergen Reactions  . Sulfonamide Derivatives Rash    Family History  Problem Relation Age of Onset  . COPD Mother   . Breast cancer Mother   . Emphysema Mother   . Liver disease Son   . Emphysema Father   . Heart attack Father     Prior to Admission medications   Medication Sig Start Date End Date Taking? Authorizing Provider  amitriptyline (ELAVIL) 50 MG tablet Take 50 mg by mouth at bedtime.   Yes [provider]  apixaban (ELIQUIS) 2.5 MG TABS tablet Take 1 tablet (2.5 mg total) by mouth 2 (two) times daily. 04/07/19  Yes O'Neal, Cassie Freer, MD  carvedilol  (COREG) 12.5 MG tablet Take 1 tablet (12.5 mg total) by mouth 2 (two) times daily with a meal. 04/07/19  Yes O'Neal, Cassie Freer, MD  cholecalciferol (VITAMIN D) 25 MCG tablet Take 1 tablet (1,000 Units total) by mouth daily. 09/25/19  Yes Mercy Riding, MD  furosemide (LASIX) 20 MG tablet Take 1 tablet (20 mg total) by mouth daily as needed for fluid or edema (Weight gain of more than 3 pounds in 1 day or 5 pounds in 2 days). 01/30/19 01/30/20 Yes Lavina Hamman, MD  ipratropium-albuterol (DUONEB) 0.5-2.5 (3) MG/3ML SOLN Inhale 3 mLs into the lungs 2 (two) times daily as needed for cough. 08/08/19  Yes [provider]  levalbuterol (XOPENEX HFA) 45 MCG/ACT inhaler Inhale 1 puff into the lungs every 4 (four) hours as needed for wheezing. 01/30/19 01/30/20 Yes Lavina Hamman, MD  levothyroxine (SYNTHROID) 75 MCG tablet Take 1 tablet (75 mcg total)  by mouth daily at 6 (six) AM. 01/31/19  Yes Lavina Hamman, MD  Multiple Vitamin (MULTIVITAMIN) tablet Take 1 tablet by mouth daily.     Yes [provider]  omeprazole (PRILOSEC) 20 MG capsule Take 20 mg by mouth daily.    Yes [provider]  predniSONE (DELTASONE) 1 MG tablet Take 3 mg by mouth daily.    Yes [provider]  tiotropium (SPIRIVA HANDIHALER) 18 MCG inhalation capsule PLACE 1 CAPSULE INTO THE INHALER AND INHALE DAILY Patient taking differently: Place 18 mcg into inhaler and inhale daily.  08/01/19  Yes Parrett, Tammy S, NP  amoxicillin-clavulanate (AUGMENTIN) 500-125 MG tablet Take 1 tablet (500 mg total) by mouth 3 (three) times daily. Patient not taking: Reported on 10/23/2019 09/24/19   Mercy Riding, MD  Spacer/Aero-Holding Chambers (AEROCHAMBER MV) inhaler Use as instructed 08/01/19   Melvenia Needles, NP    Physical Exam: Vitals:   10/23/19 1830 10/23/19 1911 10/23/19 1952 10/23/19 1957  BP: 123/71 (!) 103/92  (!) 122/56  Pulse: 96 92  97  Resp: '18 18  19  ' Temp:    99.8 F (37.7 C)  TempSrc:    Oral   SpO2: 100% 94%  99%  Weight:   51.6 kg     Constitutional: NAD, calm, comfortable Vitals:   10/23/19 1830 10/23/19 1911 10/23/19 1952 10/23/19 1957  BP: 123/71 (!) 103/92  (!) 122/56  Pulse: 96 92  97  Resp: '18 18  19  ' Temp:    99.8 F (37.7 C)  TempSrc:    Oral  SpO2: 100% 94%  99%  Weight:   51.6 kg    General:  Pleasantly resting in bed, No acute distress. Alert to person only - answers some simple questions appropriately HEENT:  Normocephalic atraumatic.  Sclerae nonicteric, noninjected.  Extraocular movements intact bilaterally. Neck:  Without mass or deformity.  Trachea is midline. Lungs:  Scant bibasilar rales without overt wheeze. Heart:  Regular rate and rhythm.  Without murmurs, rubs, or gallops. Abdomen:  Soft, nontender, nondistended.  Without guarding or rebound. Extremities: Without cyanosis, clubbing, edema, or obvious deformity. Vascular:  Dorsalis pedis and posterior tibial pulses palpable bilaterally. Skin:  Warm and dry, no erythema, no ulcerations.  Labs on Admission: I have personally reviewed following labs and imaging studies  CBC: Recent Labs  Lab 10/23/19 1114  WBC 21.7*  NEUTROABS 20.0*  HGB 10.2*  HCT 33.0*  MCV 101.9*  PLT 465   Basic Metabolic Panel: Recent Labs  Lab 10/23/19 1114  NA 138  K 5.7*  CL 105  CO2 25  GLUCOSE 91  BUN 20  CREATININE 1.03*  CALCIUM 7.7*   GFR: Estimated Creatinine Clearance: 31.9 mL/min (A) (by C-G formula based on SCr of 1.03 mg/dL (H)). Liver Function Tests: Recent Labs  Lab 10/23/19 1114  AST 173*  ALT 82*  ALKPHOS 119  BILITOT 1.7*  PROT 5.8*  ALBUMIN 3.0*   No results for input(s): LIPASE, AMYLASE in the last 168 hours. No results for input(s): AMMONIA in the last 168 hours. Coagulation Profile: Recent Labs  Lab 10/23/19 1127  INR 1.5*   Cardiac Enzymes: Recent Labs  Lab 10/23/19 1222  CKTOTAL 104   BNP (last 3 results) No results for input(s): PROBNP in the last 8760  hours. HbA1C: No results for input(s): HGBA1C in the last 72 hours. CBG: No results for input(s): GLUCAP in the last 168 hours. Lipid Profile: No results for input(s): CHOL, HDL,  LDLCALC, TRIG, CHOLHDL, LDLDIRECT in the last 72 hours. Thyroid Function Tests: No results for input(s): TSH, T4TOTAL, FREET4, T3FREE, THYROIDAB in the last 72 hours. Anemia Panel: No results for input(s): VITAMINB12, FOLATE, FERRITIN, TIBC, IRON, RETICCTPCT in the last 72 hours. Urine analysis:    Component Value Date/Time   COLORURINE YELLOW 10/23/2019 1114   APPEARANCEUR CLEAR 10/23/2019 1114   LABSPEC 1.014 10/23/2019 1114   PHURINE 5.0 10/23/2019 1114   GLUCOSEU NEGATIVE 10/23/2019 1114   HGBUR MODERATE (A) 10/23/2019 1114   BILIRUBINUR NEGATIVE 10/23/2019 1114   KETONESUR NEGATIVE 10/23/2019 1114   PROTEINUR NEGATIVE 10/23/2019 1114   NITRITE NEGATIVE 10/23/2019 1114   LEUKOCYTESUR TRACE (A) 10/23/2019 1114    Radiological Exams on Admission: CT Chest W Contrast  Result Date: 10/23/2019 CLINICAL DATA:  Suspected UTI. EXAM: CT CHEST, ABDOMEN, AND PELVIS WITH CONTRAST TECHNIQUE: Multidetector CT imaging of the chest, abdomen and pelvis was performed following the standard protocol during bolus administration of intravenous contrast. CONTRAST:  43m OMNIPAQUE IOHEXOL 300 MG/ML  SOLN COMPARISON:  January 24, 2019 FINDINGS: CT CHEST FINDINGS Cardiovascular: There is moderate severity calcification of the thoracic aorta. Normal heart size. No pericardial effusion. Moderate severity coronary artery calcification is seen. Mediastinum/Nodes: No enlarged mediastinal, hilar, or axillary lymph nodes. Lungs/Pleura: A 6 mm predominately calcified lung nodule is seen within the posterior lateral aspect of the right upper lobe. Stable mild to moderate severity right middle lobe scarring and atelectasis is seen. Very mild areas of atelectasis are seen within the bilateral lung bases. Very small bilateral pleural effusions  are seen. No pneumothorax is identified. Musculoskeletal: Multilevel degenerative changes seen throughout the thoracic spine. CT ABDOMEN PELVIS FINDINGS Hepatobiliary: No focal liver abnormality is seen. No gallstones, gallbladder wall thickening, or biliary dilatation. Pancreas: Unremarkable. No pancreatic ductal dilatation or surrounding inflammatory changes. Spleen: Normal in size without focal abnormality. Adrenals/Urinary Tract: Adrenal glands are unremarkable. Kidneys are normal, without renal calculi, focal lesion, or hydronephrosis. Bladder is unremarkable. Stomach/Bowel: Stomach is within normal limits. The appendix is not clearly identified. No evidence of bowel wall thickening, distention, or inflammatory changes. Vascular/Lymphatic: There is marked severity calcification of the abdominal aorta and bilateral common iliac arteries. No enlarged abdominal or pelvic lymph nodes. Reproductive: Status post hysterectomy. No adnexal masses. Other: No abdominal wall hernia or abnormality. No abdominopelvic ascites. Musculoskeletal: There is grade 1 anterolisthesis of the L5 vertebral body on S1. Multilevel degenerative changes seen throughout the lumbar spine. IMPRESSION: 1. Very small bilateral pleural effusions. 2. Stable mild to moderate severity right middle lobe scarring and atelectasis. 3. Stable 6 mm predominately calcified lung nodule within the posterior lateral aspect of the right upper lobe. 4. Marked severity calcification of the abdominal aorta and bilateral common iliac arteries. 5. Grade 1 anterolisthesis of the L5 vertebral body on S1. Aortic Atherosclerosis (ICD10-I70.0). Electronically Signed   By: TVirgina NorfolkM.D.   On: 10/23/2019 15:30   CT Abdomen Pelvis W Contrast  Result Date: 10/23/2019 CLINICAL DATA:  Recurring UTIs. EXAM: CT CHEST, ABDOMEN, AND PELVIS WITH CONTRAST TECHNIQUE: Multidetector CT imaging of the chest, abdomen and pelvis was performed following the standard protocol  during bolus administration of intravenous contrast. CONTRAST:  869mOMNIPAQUE IOHEXOL 300 MG/ML  SOLN COMPARISON:  None. FINDINGS: CT CHEST FINDINGS Cardiovascular: There is moderate severity calcification of the thoracic aorta. Normal heart size. No pericardial effusion. Moderate severity coronary artery calcification is seen. Mediastinum/Nodes: No enlarged mediastinal, hilar, or axillary lymph nodes. Lungs/Pleura: A 6 mm predominately  calcified lung nodule is seen within the posterior lateral aspect of the right upper lobe. Stable mild to moderate severity right middle lobe scarring and atelectasis is seen. Very mild areas of atelectasis are seen within the bilateral lung bases. Very small bilateral pleural effusions are seen. No pneumothorax is identified. Musculoskeletal: Multilevel degenerative changes seen throughout the thoracic spine. CT ABDOMEN PELVIS FINDINGS Hepatobiliary: No focal liver abnormality is seen. No gallstones, gallbladder wall thickening, or biliary dilatation. Pancreas: Unremarkable. No pancreatic ductal dilatation or surrounding inflammatory changes. Spleen: Normal in size without focal abnormality. Adrenals/Urinary Tract: Adrenal glands are unremarkable. Kidneys are normal, without renal calculi, focal lesion, or hydronephrosis. Bladder is unremarkable. Stomach/Bowel: Stomach is within normal limits. The appendix is not clearly identified. No evidence of bowel wall thickening, distention, or inflammatory changes. Vascular/Lymphatic: There is marked severity calcification of the abdominal aorta and bilateral common iliac arteries. No enlarged abdominal or pelvic lymph nodes. Reproductive: Status post hysterectomy. No adnexal masses. Other: No abdominal wall hernia or abnormality. No abdominopelvic ascites. Musculoskeletal: There is grade 1 anterolisthesis of the L5 vertebral body on S1. Multilevel degenerative changes seen throughout the lumbar spine. IMPRESSION: 1. Very small bilateral  pleural effusions. 2. Stable mild to moderate severity right middle lobe scarring and atelectasis. 3. Stable 6 mm predominately calcified lung nodule within the posterior lateral aspect of the right upper lobe. 4. Marked severity calcification of the abdominal aorta and bilateral common iliac arteries. 5. Grade 1 anterolisthesis of the L5 vertebral body on S1. Aortic Atherosclerosis (ICD10-I70.0). Electronically Signed   By: Virgina Norfolk M.D.   On: 10/23/2019 15:31   DG Chest Port 1 View  Result Date: 10/23/2019 CLINICAL DATA:  Hypoxia EXAM: PORTABLE CHEST 1 VIEW COMPARISON:  10/04/2019 FINDINGS: The heart size and mediastinal contours are within normal limits. Benign calcified nodule of the right upper lobe. Mild, diffuse interstitial pulmonary opacity. Probable trace pleural effusions. The visualized skeletal structures are unremarkable. IMPRESSION: Mild, diffuse interstitial pulmonary opacity and probable trace pleural effusions, consistent with edema or infection. No focal airspace opacity. Electronically Signed   By: Eddie Candle M.D.   On: 10/23/2019 11:46    EKG: Independently reviewed.  Sinus tachycardia without overt ST elevation or depression.   Little Ishikawa DO [facility] For contact please use secure messenger on Epic  If 7PM-7AM, please contact night-coverage located on [email or web address]   10/23/2019, 8:05 PM

## 2019-10-24 DIAGNOSIS — I10 Essential (primary) hypertension: Secondary | ICD-10-CM

## 2019-10-24 DIAGNOSIS — J439 Emphysema, unspecified: Secondary | ICD-10-CM

## 2019-10-24 DIAGNOSIS — J9602 Acute respiratory failure with hypercapnia: Secondary | ICD-10-CM

## 2019-10-24 DIAGNOSIS — G9341 Metabolic encephalopathy: Secondary | ICD-10-CM

## 2019-10-24 DIAGNOSIS — D72829 Elevated white blood cell count, unspecified: Secondary | ICD-10-CM

## 2019-10-24 DIAGNOSIS — J9601 Acute respiratory failure with hypoxia: Secondary | ICD-10-CM

## 2019-10-24 DIAGNOSIS — M353 Polymyalgia rheumatica: Secondary | ICD-10-CM

## 2019-10-24 DIAGNOSIS — I480 Paroxysmal atrial fibrillation: Secondary | ICD-10-CM

## 2019-10-24 DIAGNOSIS — N390 Urinary tract infection, site not specified: Secondary | ICD-10-CM

## 2019-10-24 LAB — COMPREHENSIVE METABOLIC PANEL
ALT: 52 U/L — ABNORMAL HIGH (ref 0–44)
AST: 66 U/L — ABNORMAL HIGH (ref 15–41)
Albumin: 2.4 g/dL — ABNORMAL LOW (ref 3.5–5.0)
Alkaline Phosphatase: 93 U/L (ref 38–126)
Anion gap: 5 (ref 5–15)
BUN: 20 mg/dL (ref 8–23)
CO2: 26 mmol/L (ref 22–32)
Calcium: 7.8 mg/dL — ABNORMAL LOW (ref 8.9–10.3)
Chloride: 107 mmol/L (ref 98–111)
Creatinine, Ser: 0.85 mg/dL (ref 0.44–1.00)
GFR calc Af Amer: >60 mL/min (ref >60)
GFR calc non Af Amer: >60 mL/min (ref >60)
Glucose, Bld: 95 mg/dL (ref 70–99)
Potassium: 3.7 mmol/L (ref 3.5–5.1)
Sodium: 138 mmol/L (ref 135–145)
Total Bilirubin: 0.7 mg/dL (ref 0.3–1.2)
Total Protein: 4.8 g/dL — ABNORMAL LOW (ref 6.5–8.1)

## 2019-10-24 LAB — BLOOD CULTURE ID PANEL (REFLEXED)

## 2019-10-24 LAB — CBC
HCT: 29.1 % — ABNORMAL LOW (ref 36.0–46.0)
Hemoglobin: 8.9 g/dL — ABNORMAL LOW (ref 12.0–15.0)
MCH: 31 pg (ref 26.0–34.0)
MCHC: 30.6 g/dL (ref 30.0–36.0)
MCV: 101.4 fL — ABNORMAL HIGH (ref 80.0–100.0)
Platelets: 160 10*3/uL (ref 150–400)
RBC: 2.87 MIL/uL — ABNORMAL LOW (ref 3.87–5.11)
RDW: 14.1 % (ref 11.5–15.5)
WBC: 20 10*3/uL — ABNORMAL HIGH (ref 4.0–10.5)
nRBC: 0 % (ref 0.0–0.2)

## 2019-10-24 MED ORDER — VITAMIN D 25 MCG (1000 UNIT) PO TABS
1000.0000 [IU] | ORAL_TABLET | Freq: Every day | ORAL | Status: DC
  Administered 2019-10-24 – 2019-10-26 (×3): 1000 [IU] via ORAL
  Filled 2019-10-24 (×3): qty 1

## 2019-10-24 MED ORDER — SODIUM CHLORIDE 0.9 % IV BOLUS
500.0000 mL | Freq: Once | INTRAVENOUS | Status: AC
  Administered 2019-10-24: 500 mL via INTRAVENOUS

## 2019-10-24 NOTE — Progress Notes (Addendum)
PROGRESS NOTE    Jenna Carrillo  XLK:[id number] DOB: 10-27-1930 DOA: 10/23/2019 PCP: Jenna Corn, MD    Brief Narrative:  Patient was admitted to the hospital with a working diagnosis of sepsis due to urinary tract infection end-organ failure encephalopathy (present on admission)  (community acquired pneumonia has been ruled out)  86 year old female who presented with flank pain, she does have significant past medical history for COPD, rheumatoid arthritis, paroxysmal atrial fibrillation, polymyalgia rheumatica, hypothyroidism, hypertension, dyslipidemia, GERD, dementia and recurrent urinary tract infections.  At home patient complained of flank pain, associated with urinary incontinence and dysuria.  Per her daughter patient was also confused.  On her initial physical examination blood pressure 103/92, heart rate 92, respiratory rate 18, temperature 99.8, oxygen saturation 94%, her lungs are clear to auscultation bilaterally, heart S1-S2, present and rhythmic, abdomen soft, tender in the pelvic region, no lower extremity edema. Sodium 138, potassium 5.7, chloride 105, bicarb 25, glucose 91, BUN 20, creatinine 1.0, white count 21.7, hemoglobin 10.2, hematocrit 33.0, platelets 188.  SARS COVID-19 negative.  Urinalysis 11-20 white cells, 11-20 red cells.  CT chest and abdomen with very small pleural effusions, stable mild to moderate scarring atelectasis right middle lobe, 6 mm calcified nodule right upper lobe.  Assessment & Plan:   Principal Problem:   Acute lower UTI Active Problems:   Essential hypertension   COPD (chronic obstructive pulmonary disease) (HCC)   Polymyalgia rheumatica (HCC)   AF (paroxysmal atrial fibrillation) (HCC)   Acute metabolic encephalopathy   Leukocytosis   Acute respiratory failure with hypoxia and hypercapnia (HCC)   1. Sepsis due to urine infection, present on admission, end-organ failure encephalopathy and lactic acidosis and shock liver.  Patient with  urinary symptoms and positive pyuria. Wbc at 20,0, blood pressure last night down to 98/45 with HR at 81.  CT abdomen with no signs of hydronephrosis.   Will continue antibiotic therapy with ceftriaxone, follow on cell count, cultures and temperature curve.   Imaging chest personally reviewed not clinical signs of pneumonia, diagnosis now ruled out.   Patient continue to be at high risk for worsening sepsis.   2. Acute hypoxic respiratory failure. Multifactorial, oxymetry this am 100 on 3 L/ min per [place]  Will continue close monitoring of oxymetry and will check a swallow evaluation.   3. Paroxysmal atrial fibrillation. Continue rate control with carvedilol and anticoagulation with apixaban. Continue telemetry monitoring  4. HTN. Continue blood pressure control with carvedilol. Hold on furosemide for now due to risk of hypotension.   5. Hypothyroid and dyslipidemia. Continue levothyroxine and diet control for lipid control.   6. Dementia. Will continue neuro checks per unit protocol, today patient responding to simple questions with no agitation.  Continue with amitriptyline.   7. PMR. Continue with prednisone and ibuprofen.   Status is: Inpatient  Remains inpatient appropriate because:IV treatments appropriate due to intensity of illness or inability to take PO   Dispo: The patient is from: Home              Anticipated d/c is to: Home              Anticipated d/c date is: 3 days              Patient currently is not medically stable to d/c.   DVT prophylaxis: Enoxaparin   Code Status:   dnr   Family Communication:  No family at the bedside    Antimicrobials:   Ceftriaxone  Subjective: Patient is awake and alert, responding to simple questions, positive pelvic pain and dysuria. No nausea or vomiting.   Objective: Vitals:   10/23/19 2246 10/23/19 2258 10/24/19 0358 10/24/19 0757  BP: (!) 95/44 (!) 98/45 (!) 94/49 (!) 113/50  Pulse: 86 81 83 74  Resp: 20  19 16    Temp: 99.2 F (37.3 C)  98.6 F (37 C) (!) 97.5 F (36.4 C)  TempSrc:    Oral  SpO2: 98% 100% 100% 100%  Weight:        Intake/Output Summary (Last 24 hours) at 10/24/2019 1549 Last data filed at 10/24/2019 0900 Gross per 24 hour  Intake 1630 ml  Output --  Net 1630 ml   Filed Weights   10/23/19 1952  Weight: 51.6 kg    Examination:   General: Not in pain or dyspnea, deconditioned and ill looking appearing  Neurology: Awake and alert, non focal  E ENT: mild pallor, no icterus, oral mucosa moist Cardiovascular: No JVD. S1-S2 present, rhythmic, no gallops, rubs, or murmurs. No lower extremity edema. Pulmonary: positive breath sounds bilaterally, with no wheezing, rhonchi or rales. Gastrointestinal. Abdomen with no organomegaly, tender to deep palpation at the pelvic region, no rebound or guarding Skin. No rashes Musculoskeletal: no joint deformities     Data Reviewed: I have personally reviewed following labs and imaging studies  CBC: Recent Labs  Lab 10/23/19 1114 10/24/19 0557  WBC 21.7* 20.0*  NEUTROABS 20.0*  --   HGB 10.2* 8.9*  HCT 33.0* 29.1*  MCV 101.9* 101.4*  PLT 188 440   Basic Metabolic Panel: Recent Labs  Lab 10/23/19 1114 10/24/19 0557  NA 138 138  K 5.7* 3.7  CL 105 107  CO2 25 26  GLUCOSE 91 95  BUN 20 20  CREATININE 1.03* 0.85  CALCIUM 7.7* 7.8*   GFR: Estimated Creatinine Clearance: 38.7 mL/min (by C-G formula based on SCr of 0.85 mg/dL). Liver Function Tests: Recent Labs  Lab 10/23/19 1114 10/24/19 0557  AST 173* 66*  ALT 82* 52*  ALKPHOS 119 93  BILITOT 1.7* 0.7  PROT 5.8* 4.8*  ALBUMIN 3.0* 2.4*   No results for input(s): LIPASE, AMYLASE in the last 168 hours. No results for input(s): AMMONIA in the last 168 hours. Coagulation Profile: Recent Labs  Lab 10/23/19 1127  INR 1.5*   Cardiac Enzymes: Recent Labs  Lab 10/23/19 1222  CKTOTAL 104   BNP (last 3 results) No results for input(s): PROBNP in the last 8760  hours. HbA1C: No results for input(s): HGBA1C in the last 72 hours. CBG: No results for input(s): GLUCAP in the last 168 hours. Lipid Profile: No results for input(s): CHOL, HDL, LDLCALC, TRIG, CHOLHDL, LDLDIRECT in the last 72 hours. Thyroid Function Tests: No results for input(s): TSH, T4TOTAL, FREET4, T3FREE, THYROIDAB in the last 72 hours. Anemia Panel: No results for input(s): VITAMINB12, FOLATE, FERRITIN, TIBC, IRON, RETICCTPCT in the last 72 hours.    Radiology Studies: I have reviewed all of the imaging during this hospital visit personally     Scheduled Meds: . amitriptyline  50 mg Oral QHS  . apixaban  2.5 mg Oral BID  . carvedilol  12.5 mg Oral BID WC  . cholecalciferol  1,000 Units Oral Daily  . levothyroxine  75 mcg Oral Q0600  . multivitamin with minerals  1 tablet Oral Daily  . pantoprazole  40 mg Oral Daily  . predniSONE  3 mg Oral Daily  . umeclidinium bromide  1 puff Inhalation  Daily   Continuous Infusions: . cefTRIAXone (ROCEPHIN)  IV Stopped (10/23/19 1857)  . sodium chloride       LOS: 1 day        Aryon Nham Annett Gula, MD

## 2019-10-24 NOTE — Progress Notes (Signed)
PHARMACY - PHYSICIAN COMMUNICATION CRITICAL VALUE ALERT - BLOOD CULTURE IDENTIFICATION (BCID)  Jenna Carrillo is an 86 y.o. female who presented to [facility] on 10/23/2019 with a chief complaint of flank pain & urinary incontinence  Assessment:Sepsis secondary to suspected UTI vs. Possible CAP/aspiration PNA:  Name of physician (or Provider) Contacted: Arrien via [facility]  Current antibiotics: ceftriaxone 1 gm IV q24 for UTI  Changes to prescribed antibiotics recommended:  Patient is on recommended antibiotics - No changes needed- likely contaminant  Results for orders placed or performed during the hospital encounter of 10/23/19  Blood Culture ID Panel (Reflexed) (Collected: 10/23/2019 11:27 AM)  Result Value Ref Range   Enterococcus species NOT DETECTED NOT DETECTED   Listeria monocytogenes NOT DETECTED NOT DETECTED   Staphylococcus species DETECTED (A) NOT DETECTED   Staphylococcus aureus (BCID) NOT DETECTED NOT DETECTED   Methicillin resistance DETECTED (A) NOT DETECTED   Streptococcus species NOT DETECTED NOT DETECTED   Streptococcus agalactiae NOT DETECTED NOT DETECTED   Streptococcus pneumoniae NOT DETECTED NOT DETECTED   Streptococcus pyogenes NOT DETECTED NOT DETECTED   Acinetobacter baumannii NOT DETECTED NOT DETECTED   Enterobacteriaceae species NOT DETECTED NOT DETECTED   Enterobacter cloacae complex NOT DETECTED NOT DETECTED   Escherichia coli NOT DETECTED NOT DETECTED   Klebsiella oxytoca NOT DETECTED NOT DETECTED   Klebsiella pneumoniae NOT DETECTED NOT DETECTED   Proteus species NOT DETECTED NOT DETECTED   Serratia marcescens NOT DETECTED NOT DETECTED   Haemophilus influenzae NOT DETECTED NOT DETECTED   Neisseria meningitidis NOT DETECTED NOT DETECTED   Pseudomonas aeruginosa NOT DETECTED NOT DETECTED   Candida albicans NOT DETECTED NOT DETECTED   Candida glabrata NOT DETECTED NOT DETECTED   Candida krusei NOT DETECTED NOT DETECTED   Candida parapsilosis NOT DETECTED  NOT DETECTED   Candida tropicalis NOT DETECTED NOT DETECTED    Len Childs T 10/24/2019  4:55 PM

## 2019-10-24 NOTE — Evaluation (Signed)
Physical Therapy Evaluation Patient Details Name: Jenna Carrillo MRN: [id number] DOB: September 11, 1930 Today's Date: 10/24/2019   History of Present Illness  86 yo female admitted with UTI, sepsis. Hx of COPD, polymyalgia rheumatica, dementia, recurrent UTIs, hypothyroidism, PMR, COPD  Clinical Impression  On eval, pt required Min assist for mobility. She was able to perform a stand pivot x 2 with assistance. No family present during session. Will continue to follow and progress activity. Recommend HHPT with 24/7 supervision as long as family can provide current level of care.     Follow Up Recommendations Home health PT;Supervision/Assistance - 24 hour    Equipment Recommendations  None recommended by PT    Recommendations for Other Services       Precautions / Restrictions Precautions Precautions: Fall Restrictions Weight Bearing Restrictions: No      Mobility  Bed Mobility Overal bed mobility: Needs Assistance Bed Mobility: Supine to Sit;Sit to Supine     Supine to sit: Min guard;HOB elevated Sit to supine: Min guard;HOB elevated   General bed mobility comments: Multimodal cueing. Increased time.  Transfers Overall transfer level: Needs assistance   Transfers: Sit to/from Stand;Stand Pivot Transfers Sit to Stand: Min assist Stand pivot transfers: Min assist       General transfer comment: Small amount of assist to steady. Cues for safety. Pivot x 2, bed<> bsc, then assisted back to bed for early supper  Ambulation/Gait                Stairs            Wheelchair Mobility    Modified Rankin (Stroke Patients Only)       Balance Overall balance assessment: Needs assistance           Standing balance-Leahy Scale: Fair                               Pertinent Vitals/Pain Pain Assessment: Faces Faces Pain Scale: No hurt    Home Living Family/patient expects to be discharged to:: Private residence Living Arrangements:  Children;Other relatives Available Help at Discharge: Family Type of Home: House       Home Layout: One level Home Equipment: Walker - 4 wheels Additional Comments: pt unable to provide history due to cognition. Environment from chart review/prior admission.    Prior Function Level of Independence: Independent with assistive device(s)         Comments: pt's family reports she ambulates wtih rollator at times but also with no device     Hand Dominance        Extremity/Trunk Assessment   Upper Extremity Assessment Upper Extremity Assessment: Generalized weakness    Lower Extremity Assessment Lower Extremity Assessment: Generalized weakness    Cervical / Trunk Assessment Cervical / Trunk Assessment: Kyphotic  Communication   Communication: No difficulties  Cognition Arousal/Alertness: Awake/alert Behavior During Therapy: WFL for tasks assessed/performed Overall Cognitive Status: History of cognitive impairments - at baseline                                        General Comments      Exercises     Assessment/Plan    PT Assessment Patient needs continued PT services  PT Problem List Decreased strength;Decreased mobility;Decreased cognition;Decreased activity tolerance;Decreased balance;Decreased safety awareness       PT Treatment Interventions  DME instruction;Gait training;Therapeutic activities;Therapeutic exercise;Patient/family education;Balance training;Functional mobility training    PT Goals (Current goals can be found in the Care Plan section)  Acute Rehab PT Goals Patient Stated Goal: pt unable to statea PT Goal Formulation: Patient unable to participate in goal setting Time For Goal Achievement: 11/07/19 Potential to Achieve Goals: Good    Frequency Min 3X/week   Barriers to discharge        Co-evaluation               AM-PAC PT "6 Clicks" Mobility  Outcome Measure Help needed turning from your back to your side  while in a flat bed without using bedrails?: A Little Help needed moving from lying on your back to sitting on the side of a flat bed without using bedrails?: A Little Help needed moving to and from a bed to a chair (including a wheelchair)?: A Little Help needed standing up from a chair using your arms (e.g., wheelchair or bedside chair)?: A Little Help needed to walk in hospital room?: A Little Help needed climbing 3-5 steps with a railing? : A Little 6 Click Score: 18    End of Session   Activity Tolerance: Patient tolerated treatment well Patient left: in bed;with call bell/phone within reach;with bed alarm set        Time: 4970-2637 PT Time Calculation (min) (ACUTE ONLY): 14 min   Charges:   PT Evaluation $PT Eval Low Complexity: 1 Low             Shunna Mikaelian P, PT Acute Rehabilitation

## 2019-10-24 NOTE — Progress Notes (Signed)
PROGRESS NOTE  Jenna Carrillo DOB: 05/29/1930 DOA: 10/23/2019 PCP: Shon Baton, MD   LOS: 1 day   Brief Narrative / Interim history: Jenna Carrillo is a 86 y.o. female with pertinent medical history of COPD (on RA), paroxysmal A. fib (on Eliquis), polymyalgia rheumatica (on prednisone), recurrent UTIs, dementia, hypothyroidism, HTN, HLD and GERD who was brought to ED by EMS due to flank pain and loss of bladder function at home. She urinated on her own floor at home as she was unable to hold her urine.  Patient was just admitted to our facility 1 month ago for similar episode of flank pain and diagnosed with UTI at that time. Patient denies any shortness of breath, chest pain, nausea, vomiting, constipation, diarrhea. Review of systems and history is somewhat limited due to patient's baseline dementia. Jenna Carrillo indicates patient is "not herself" and is acting quite similarly to last time she had a UTI approximately 1 month ago when she was at our facility.  In the ED, patient met criteria for sepsis due to fever, tachycardia, tachypnea and presumed source of UTI. Her routine lab work was concerning for elevated lactic acidosis, abnormal UA with trace leukocytes, leukocytosis at 22, macrocytic anemia stable at 10.2, with hyperkalemia and minimally elevated AST ALT and T bili.  Patient also had what appears to be acute hypoxic respiratory failure. CT chest remarkable for right middle lung opacification as well as scant bibasilar effusions.  Given sepsis criteria met in the setting of acute infection of likely 2 possible sources hospitalist was called to admit.  Subjective / 24h Interval events: Patient was eating breakfast in bed on morning rounds. Patient interview is limited due to dementia, though Jenna Carrillo is able to answer some yes/no questions.   Assessment & Plan: Principal Problem Sepsis secondary to suspected UTI vs. Possible CAP/aspiration PNA: - Low suspicion  for PNA due to unchanged and comparable recent CXR findings. Also, CT with corresponding findings of RML scarring and atelectasis, very small bilateral pleural effusions, and with a stable calcified nodule in posterolateral RUL. Lactic acid trending down on 6/6. - leukocytosis, flank pain concerning for UTI despite benign UA findings, although patient has been recently receiving antibiotics. Will treat based on symptoms of dysuria. - On ceftriaxone x5 days, IV fluids.  - Patient likely at risk for aspiration allthough patient and family deny cough with PO intake. Nonetheless, will have speech evaluate patient. - Stop Azithromycin.  Active Problems Acute hypoxic respiratory failure 2/2 above / rule out COPD exacerbation: - continue IV fluids, supportive care, pulmonary hygiene with incentive spirometry and flutter valve. - continue home meds including spiriva / xopenex / prednisone  - Consider methylprednisolone if not improving with routine inhalers - no wheeze currently  Shock liver in the setting of sepsis:  - on 6/7, Alk Phos down to 93 from 119, ASt down to 66 from 173, and ALT down to 52 from 82.  - similar LFT elevation on recent hospitalization, with improvement with IV fluids. - continue IV fluids, follow morning labs, expect downtrend as volume resuscitation continues.  Hyperkalemia, mild: K 3.7 on 6/7. Continue to monitor.   PAF - rate controlled: - On Eliquis. - rate currently controlled on home medications including carvedilol - Continue telemetry.  Dementia:  - Continue amitriptyline  - Alert/oriented to person only - similar today but "off" per family without specifics  HTN: continue carvedilol, lasix.  HLD: diet controlled - no statin on med rec.  Hypothyroidism:  continue home levothyroxine.  Scheduled Meds: . amitriptyline  50 mg Oral QHS  . apixaban  2.5 mg Oral BID  . carvedilol  12.5 mg Oral BID WC  . cholecalciferol  1,000 Units Oral Daily  .  levothyroxine  75 mcg Oral Q0600  . multivitamin with minerals  1 tablet Oral Daily  . pantoprazole  40 mg Oral Daily  . predniSONE  3 mg Oral Daily  . umeclidinium bromide  1 puff Inhalation Daily   Continuous Infusions: . azithromycin Stopped (10/23/19 1857)  . cefTRIAXone (ROCEPHIN)  IV Stopped (10/23/19 1857)  . sodium chloride     PRN Meds:.furosemide, ibuprofen, ipratropium-albuterol, ondansetron **OR** ondansetron (ZOFRAN) IV, senna-docusate  DVT prophylaxis: Eliquis Code Status: DNR Family Communication: No family at bedside on rounds.  Status is: Inpatient  Remains inpatient appropriate because:Inpatient level of care appropriate due to severity of illness   Dispo: The patient is from: Home              Anticipated d/c is to: Home              Anticipated d/c date is: 3 days              Patient currently is not medically stable to d/c.   Consultants:  None  Procedures:  2D echo: None. Foley: None. BiPAP: None. HD: None.  Microbiology  Covid negative. Blood, urine and sputum cultures pending.  Antimicrobials: Ceftriaxone, Azithromycin, Cefepime (Augmentin charted on home meds)   Objective: Vitals:   10/23/19 2246 10/23/19 2258 10/24/19 0358 10/24/19 0757  BP: (!) 95/44 (!) 98/45 (!) 94/49 (!) 113/50  Pulse: 86 81 83 74  Resp: '20  19 16  ' Temp: 99.2 F (37.3 C)  98.6 F (37 C) (!) 97.5 F (36.4 C)  TempSrc:    Oral  SpO2: 98% 100% 100% 100%  Weight:        Intake/Output Summary (Last 24 hours) at 10/24/2019 1115 Last data filed at 10/24/2019 0900 Gross per 24 hour  Intake 2230 ml  Output --  Net 2230 ml   Filed Weights   10/23/19 1952  Weight: 51.6 kg    Examination:  Constitutional: NAD. Patient is pleasantly demented. Answers simple questions only. Poor historian. Eyes: no scleral icterus ENMT: Mucous membranes are moist.  Neck: normal, supple Respiratory: clear to auscultation bilaterally, no wheezing, no crackles. Normal respiratory  effort. No accessory muscle use.  Cardiovascular: Regular rate and rhythm, no murmurs / rubs / gallops. No LE edema. Good peripheral pulses Abdomen: non distended, mild tenderness to palpation in suprapubic region. Bowel sounds positive.  Musculoskeletal: no clubbing / cyanosis.  Skin: no rashes. Neurologic: grossly non-focal. Psychiatric: only alert to self, not place or time. Pleasantly demented.   Data Reviewed: I have independently reviewed following labs and imaging studies   CBC: Recent Labs  Lab 10/23/19 1114 10/24/19 0557  WBC 21.7* 20.0*  NEUTROABS 20.0*  --   HGB 10.2* 8.9*  HCT 33.0* 29.1*  MCV 101.9* 101.4*  PLT 188 295   Basic Metabolic Panel: Recent Labs  Lab 10/23/19 1114 10/24/19 0557  NA 138 138  K 5.7* 3.7  CL 105 107  CO2 25 26  GLUCOSE 91 95  BUN 20 20  CREATININE 1.03* 0.85  CALCIUM 7.7* 7.8*   Liver Function Tests: Recent Labs  Lab 10/23/19 1114 10/24/19 0557  AST 173* 66*  ALT 82* 52*  ALKPHOS 119 93  BILITOT 1.7* 0.7  PROT 5.8* 4.8*  ALBUMIN 3.0* 2.4*   Coagulation Profile: Recent Labs  Lab 10/23/19 1127  INR 1.5*   HbA1C: No results for input(s): HGBA1C in the last 72 hours. CBG: No results for input(s): GLUCAP in the last 168 hours.  Recent Results (from the past 240 hour(s))  Blood Culture (routine x 2)     Status: None (Preliminary result)   Collection Time: 10/23/19 11:27 AM   Specimen: BLOOD LEFT HAND  Result Value Ref Range Status   Specimen Description   Final    BLOOD LEFT HAND LEFT HAND Performed at [facility] [address] [place], [place] [address]    Special Requests   Final    BOTTLES DRAWN AEROBIC AND ANAEROBIC Blood Culture results may not be optimal due to an inadequate volume of blood received in culture bottles Performed at [facility] [address] [place], [place] [address]    Culture   Final    NO GROWTH < 24 HOURS Performed at [facility] [address], [place], [place] [address]    Report Status PENDING  Incomplete  SARS Coronavirus 2 by RT PCR (hospital order, performed in [facility] hospital lab) Nasopharyngeal Nasopharyngeal Swab     Status: None   Collection Time: 10/23/19 11:30 AM   Specimen: Nasopharyngeal Swab  Result Value Ref Range Status   SARS Coronavirus 2 NEGATIVE NEGATIVE Final    Comment: (NOTE) SARS-CoV-2 target nucleic acids are NOT DETECTED. The SARS-CoV-2 RNA is generally detectable in upper and lower respiratory specimens during the acute phase of infection. The lowest concentration of SARS-CoV-2 viral copies this assay can detect is 250 copies / mL. A negative result does not preclude SARS-CoV-2 infection and should not be used as the sole basis for treatment or other patient management decisions.  A negative result may occur with improper specimen collection / handling, submission of specimen other than nasopharyngeal swab, presence of viral mutation(s) within the areas targeted by this assay, and inadequate number of viral copies (<250 copies / mL). A negative result must be combined with clinical observations, patient history, and epidemiological information. Fact Sheet for Patients:   StrictlyIdeas.no Fact Sheet for Healthcare Providers: BankingDealers.co.za This test is not yet approved or cleared  by the [place] FDA and has been authorized for detection and/or diagnosis of SARS-CoV-2 by FDA under an Emergency Use Authorization (EUA).  This EUA will remain in effect (meaning this test can be used) for the duration of the COVID-19 declaration under Section 564(b)(1) of the Act, 21 U.S.C. section 360bbb-3(b)(1), unless the authorization is terminated or revoked sooner. Performed at [facility], [place] [address], [place],  [address]      Radiology Studies: CT Chest W Contrast  Result Date: 10/23/2019 CLINICAL  DATA:  Suspected UTI. EXAM: CT CHEST, ABDOMEN, AND PELVIS WITH CONTRAST TECHNIQUE: Multidetector CT imaging of the chest, abdomen and pelvis was performed following the standard protocol during bolus administration of intravenous contrast. CONTRAST:  90m OMNIPAQUE IOHEXOL 300 MG/ML  SOLN COMPARISON:  January 24, 2019 FINDINGS: CT CHEST FINDINGS Cardiovascular: There is moderate severity calcification of the thoracic aorta. Normal heart size. No pericardial effusion. Moderate severity coronary artery calcification is seen. Mediastinum/Nodes: No enlarged mediastinal, hilar, or axillary lymph nodes. Lungs/Pleura: A 6 mm predominately calcified lung nodule is seen within the posterior lateral aspect of the right upper lobe. Stable mild to moderate severity right middle lobe scarring and atelectasis is seen. Very mild areas  of atelectasis are seen within the bilateral lung bases. Very small bilateral pleural effusions are seen. No pneumothorax is identified. Musculoskeletal: Multilevel degenerative changes seen throughout the thoracic spine. CT ABDOMEN PELVIS FINDINGS Hepatobiliary: No focal liver abnormality is seen. No gallstones, gallbladder wall thickening, or biliary dilatation. Pancreas: Unremarkable. No pancreatic ductal dilatation or surrounding inflammatory changes. Spleen: Normal in size without focal abnormality. Adrenals/Urinary Tract: Adrenal glands are unremarkable. Kidneys are normal, without renal calculi, focal lesion, or hydronephrosis. Bladder is unremarkable. Stomach/Bowel: Stomach is within normal limits. The appendix is not clearly identified. No evidence of bowel wall thickening, distention, or inflammatory changes. Vascular/Lymphatic: There is marked severity calcification of the abdominal aorta and bilateral common iliac arteries. No enlarged abdominal or pelvic lymph nodes. Reproductive: Status post hysterectomy. No adnexal masses. Other: No abdominal wall hernia or abnormality. No  abdominopelvic ascites. Musculoskeletal: There is grade 1 anterolisthesis of the L5 vertebral body on S1. Multilevel degenerative changes seen throughout the lumbar spine. IMPRESSION: 1. Very small bilateral pleural effusions. 2. Stable mild to moderate severity right middle lobe scarring and atelectasis. 3. Stable 6 mm predominately calcified lung nodule within the posterior lateral aspect of the right upper lobe. 4. Marked severity calcification of the abdominal aorta and bilateral common iliac arteries. 5. Grade 1 anterolisthesis of the L5 vertebral body on S1. Aortic Atherosclerosis (ICD10-I70.0). Electronically Signed   By: Virgina Norfolk M.D.   On: 10/23/2019 15:30   CT Abdomen Pelvis W Contrast  Result Date: 10/23/2019 CLINICAL DATA:  Recurring UTIs. EXAM: CT CHEST, ABDOMEN, AND PELVIS WITH CONTRAST TECHNIQUE: Multidetector CT imaging of the chest, abdomen and pelvis was performed following the standard protocol during bolus administration of intravenous contrast. CONTRAST:  24m OMNIPAQUE IOHEXOL 300 MG/ML  SOLN COMPARISON:  None. FINDINGS: CT CHEST FINDINGS Cardiovascular: There is moderate severity calcification of the thoracic aorta. Normal heart size. No pericardial effusion. Moderate severity coronary artery calcification is seen. Mediastinum/Nodes: No enlarged mediastinal, hilar, or axillary lymph nodes. Lungs/Pleura: A 6 mm predominately calcified lung nodule is seen within the posterior lateral aspect of the right upper lobe. Stable mild to moderate severity right middle lobe scarring and atelectasis is seen. Very mild areas of atelectasis are seen within the bilateral lung bases. Very small bilateral pleural effusions are seen. No pneumothorax is identified. Musculoskeletal: Multilevel degenerative changes seen throughout the thoracic spine. CT ABDOMEN PELVIS FINDINGS Hepatobiliary: No focal liver abnormality is seen. No gallstones, gallbladder wall thickening, or biliary dilatation. Pancreas:  Unremarkable. No pancreatic ductal dilatation or surrounding inflammatory changes. Spleen: Normal in size without focal abnormality. Adrenals/Urinary Tract: Adrenal glands are unremarkable. Kidneys are normal, without renal calculi, focal lesion, or hydronephrosis. Bladder is unremarkable. Stomach/Bowel: Stomach is within normal limits. The appendix is not clearly identified. No evidence of bowel wall thickening, distention, or inflammatory changes. Vascular/Lymphatic: There is marked severity calcification of the abdominal aorta and bilateral common iliac arteries. No enlarged abdominal or pelvic lymph nodes. Reproductive: Status post hysterectomy. No adnexal masses. Other: No abdominal wall hernia or abnormality. No abdominopelvic ascites. Musculoskeletal: There is grade 1 anterolisthesis of the L5 vertebral body on S1. Multilevel degenerative changes seen throughout the lumbar spine. IMPRESSION: 1. Very small bilateral pleural effusions. 2. Stable mild to moderate severity right middle lobe scarring and atelectasis. 3. Stable 6 mm predominately calcified lung nodule within the posterior lateral aspect of the right upper lobe. 4. Marked severity calcification of the abdominal aorta and bilateral common iliac arteries. 5. Grade 1 anterolisthesis of the L5 vertebral  body on S1. Aortic Atherosclerosis (ICD10-I70.0). Electronically Signed   By: Virgina Norfolk M.D.   On: 10/23/2019 15:31   DG Chest Port 1 View  Result Date: 10/23/2019 CLINICAL DATA:  Hypoxia EXAM: PORTABLE CHEST 1 VIEW COMPARISON:  10/04/2019 FINDINGS: The heart size and mediastinal contours are within normal limits. Benign calcified nodule of the right upper lobe. Mild, diffuse interstitial pulmonary opacity. Probable trace pleural effusions. The visualized skeletal structures are unremarkable. IMPRESSION: Mild, diffuse interstitial pulmonary opacity and probable trace pleural effusions, consistent with edema or infection. No focal airspace  opacity. Electronically Signed   By: Eddie Candle M.D.   On: 10/23/2019 11:46

## 2019-10-25 LAB — BASIC METABOLIC PANEL
Anion gap: 6 (ref 5–15)
BUN: 23 mg/dL (ref 8–23)
CO2: 25 mmol/L (ref 22–32)
Calcium: 8.3 mg/dL — ABNORMAL LOW (ref 8.9–10.3)
Chloride: 108 mmol/L (ref 98–111)
Creatinine, Ser: 0.95 mg/dL (ref 0.44–1.00)
GFR calc Af Amer: >60 mL/min (ref >60)
GFR calc non Af Amer: 54 mL/min — ABNORMAL LOW (ref >60)
Glucose, Bld: 96 mg/dL (ref 70–99)
Potassium: 3.4 mmol/L — ABNORMAL LOW (ref 3.5–5.1)
Sodium: 139 mmol/L (ref 135–145)

## 2019-10-25 LAB — CBC WITH DIFFERENTIAL/PLATELET
Abs Immature Granulocytes: 0.05 10*3/uL (ref 0.00–0.07)
Basophils Absolute: 0 10*3/uL (ref 0.0–0.1)
Basophils Relative: 0 %
Eosinophils Absolute: 0.5 10*3/uL (ref 0.0–0.5)
Eosinophils Relative: 4 %
HCT: 30.5 % — ABNORMAL LOW (ref 36.0–46.0)
Hemoglobin: 9.5 g/dL — ABNORMAL LOW (ref 12.0–15.0)
Immature Granulocytes: 0 %
Lymphocytes Relative: 7 %
Lymphs Abs: 1 10*3/uL (ref 0.7–4.0)
MCH: 31.1 pg (ref 26.0–34.0)
MCHC: 31.1 g/dL (ref 30.0–36.0)
MCV: 100 fL (ref 80.0–100.0)
Monocytes Absolute: 0.9 10*3/uL (ref 0.1–1.0)
Monocytes Relative: 7 %
Neutro Abs: 10.6 10*3/uL — ABNORMAL HIGH (ref 1.7–7.7)
Neutrophils Relative %: 82 %
Platelets: 162 10*3/uL (ref 150–400)
RBC: 3.05 MIL/uL — ABNORMAL LOW (ref 3.87–5.11)
RDW: 14.3 % (ref 11.5–15.5)
WBC: 13 10*3/uL — ABNORMAL HIGH (ref 4.0–10.5)
nRBC: 0 % (ref 0.0–0.2)

## 2019-10-25 LAB — URINE CULTURE: Culture: 90000 — AB

## 2019-10-25 MED ORDER — SODIUM CHLORIDE 0.9 % IV SOLN
2.0000 g | Freq: Two times a day (BID) | INTRAVENOUS | Status: DC
  Administered 2019-10-25 – 2019-10-26 (×3): 2 g via INTRAVENOUS
  Filled 2019-10-25 (×4): qty 2

## 2019-10-25 NOTE — Progress Notes (Signed)
PROGRESS NOTE    Jenna Carrillo  BOF:[id number] DOB: 1931-01-20 DOA: 10/23/2019 PCP: Creola Corn, MD    Brief Narrative:  Patient was admitted to the hospital with a working diagnosis of sepsis due to urinary tract infection end-organ failure encephalopathy (present on admission)  (community acquired pneumonia has been ruled out)  86 year old female who presented with flank pain, she does have significant past medical history for COPD, rheumatoid arthritis, paroxysmal atrial fibrillation, polymyalgia rheumatica, hypothyroidism, hypertension, dyslipidemia, GERD, dementia and recurrent urinary tract infections.  At home patient complained of flank pain, associated with urinary incontinence and dysuria.  Per her daughter patient was also confused.  On her initial physical examination blood pressure 103/92, heart rate 92, respiratory rate 18, temperature 99.8, oxygen saturation 94%, her lungs are clear to auscultation bilaterally, heart S1-S2, present and rhythmic, abdomen soft, tender in the pelvic region, no lower extremity edema. Sodium 138, potassium 5.7, chloride 105, bicarb 25, glucose 91, BUN 20, creatinine 1.0, white count 21.7, hemoglobin 10.2, hematocrit 33.0, platelets 188.  SARS COVID-19 negative.  Urinalysis 11-20 white cells, 11-20 red cells.  CT chest and abdomen with very small pleural effusions, stable mild to moderate scarring atelectasis right middle lobe, 6 mm calcified nodule right upper lobe.  Urine culture resulted positive for pseudomonas, antibiotic therapy has been changed to cefepime.    Assessment & Plan:   Principal Problem:   Sepsis secondary to UTI [facility]) Active Problems:   Essential hypertension   COPD (chronic obstructive pulmonary disease) (HCC)   Polymyalgia rheumatica (HCC)   AF (paroxysmal atrial fibrillation) (HCC)   Acute metabolic encephalopathy   Leukocytosis   Acute respiratory failure with hypoxia and hypercapnia (HCC)   1. Sepsis due to urine  infection, present on admission, end-organ failure encephalopathy, hypotension, lactic acidosis and shock liver.  Improved blood pressure to 133/64 mmHg, wbc down to 13 from 20, urine culture positive for pseudomonas, blood culture #1 bottle coag negative staph, #2 MRSA, likely contamination.  CT abdomen with no signs of hydronephrosis.   Will change antibiotic therapy to cefepime and will repeat blood cultures. Continue close follow up on cell count and temperature curve.   Patient continue to be at high risk for worsening sepsis.   2. Acute hypoxic respiratory failure. Multifactorial. Stable oxymetry patient on 3 L/ min with oxygenation 98 to 100%.   Continue regular diet per speech recommendations.  3. Paroxysmal atrial fibrillation. On carvedilol for rate control and anticoagulation with apixaban.  4. HTN. Improved blood pressure, will continue with carvedilol. Will hold on IV fluids.   5. Hypothyroid and dyslipidemia. On levothyroxine, not on statin at home.    6. Dementia. Her mentation is stable, no agitation. Will continue neuro checks per unit protocol. Patient on amitriptyline.   7. PMR. On prednisone and ibuprofen with good toleration.    Status is: Inpatient  Remains inpatient appropriate because:IV treatments appropriate due to intensity of illness or inability to take PO   Dispo: The patient is from: Home              Anticipated d/c is to: Home              Anticipated d/c date is: 2 days              Patient currently is not medically stable to d/c.   DVT prophylaxis: Enoxaparin   Code Status:   dnr   Family Communication:  No family at the bedside  Subjective: Patient continue to be confused and disorientate, not frank pain or dyspnea, no chest pain.   Objective: Vitals:   10/24/19 2022 10/25/19 0401 10/25/19 0737 10/25/19 1430  BP: (!) 122/58 124/71  133/64  Pulse: 84 76  79  Resp: 18 19  18   Temp: 97.6 F (36.4 C) 97.9 F (36.6 C)   97.8 F (36.6 C)  TempSrc: Oral Oral  Oral  SpO2: 100% 99% 99% 98%  Weight:        Intake/Output Summary (Last 24 hours) at 10/25/2019 1654 Last data filed at 10/25/2019 1431 Gross per 24 hour  Intake 952 ml  Output --  Net 952 ml   Filed Weights   10/23/19 1952  Weight: 51.6 kg    Examination:   General: Not in pain or dyspnea, deconditioned  Neurology: Awake and alert, non focal disorientated  E ENT: mild pallor, no icterus, oral mucosa moist Cardiovascular: No JVD. S1-S2 present, rhythmic, no gallops, rubs, or murmurs. No lower extremity edema. Pulmonary: positive breath sounds bilaterally, adequate air movement, no wheezing, rhonchi or rales. Gastrointestinal. Abdomen with no organomegaly, non tender, no rebound or guarding Skin. No rashes Musculoskeletal: no joint deformities     Data Reviewed: I have personally reviewed following labs and imaging studies  CBC: Recent Labs  Lab 10/23/19 1114 10/24/19 0557 10/25/19 0508  WBC 21.7* 20.0* 13.0*  NEUTROABS 20.0*  --  10.6*  HGB 10.2* 8.9* 9.5*  HCT 33.0* 29.1* 30.5*  MCV 101.9* 101.4* 100.0  PLT 188 160 952   Basic Metabolic Panel: Recent Labs  Lab 10/23/19 1114 10/24/19 0557 10/25/19 0508  NA 138 138 139  K 5.7* 3.7 3.4*  CL 105 107 108  CO2 25 26 25   GLUCOSE 91 95 96  BUN 20 20 23   CREATININE 1.03* 0.85 0.95  CALCIUM 7.7* 7.8* 8.3*   GFR: Estimated Creatinine Clearance: 34.6 mL/min (by C-G formula based on SCr of 0.95 mg/dL). Liver Function Tests: Recent Labs  Lab 10/23/19 1114 10/24/19 0557  AST 173* 66*  ALT 82* 52*  ALKPHOS 119 93  BILITOT 1.7* 0.7  PROT 5.8* 4.8*  ALBUMIN 3.0* 2.4*   No results for input(s): LIPASE, AMYLASE in the last 168 hours. No results for input(s): AMMONIA in the last 168 hours. Coagulation Profile: Recent Labs  Lab 10/23/19 1127  INR 1.5*   Cardiac Enzymes: Recent Labs  Lab 10/23/19 1222  CKTOTAL 104   BNP (last 3 results) No results for input(s):  PROBNP in the last 8760 hours. HbA1C: No results for input(s): HGBA1C in the last 72 hours. CBG: No results for input(s): GLUCAP in the last 168 hours. Lipid Profile: No results for input(s): CHOL, HDL, LDLCALC, TRIG, CHOLHDL, LDLDIRECT in the last 72 hours. Thyroid Function Tests: No results for input(s): TSH, T4TOTAL, FREET4, T3FREE, THYROIDAB in the last 72 hours. Anemia Panel: No results for input(s): VITAMINB12, FOLATE, FERRITIN, TIBC, IRON, RETICCTPCT in the last 72 hours.    Radiology Studies: I have reviewed all of the imaging during this hospital visit personally     Scheduled Meds: . amitriptyline  50 mg Oral QHS  . apixaban  2.5 mg Oral BID  . carvedilol  12.5 mg Oral BID WC  . cholecalciferol  1,000 Units Oral Daily  . levothyroxine  75 mcg Oral Q0600  . multivitamin with minerals  1 tablet Oral Daily  . pantoprazole  40 mg Oral Daily  . predniSONE  3 mg Oral Daily  . umeclidinium bromide  1 puff Inhalation Daily   Continuous Infusions: . ceFEPime (MAXIPIME) IV 2 g (10/25/19 1244)     LOS: 2 days        Berlene Dixson Annett Gula, MD

## 2019-10-25 NOTE — Evaluation (Signed)
Clinical/Bedside Swallow Evaluation Patient Details  Name: Jenna Carrillo MRN: [id number] Date of Birth: 1930-08-09  Today's Date: 10/25/2019 Time: SLP Start Time (ACUTE ONLY): 1120 SLP Stop Time (ACUTE ONLY): 1145 SLP Time Calculation (min) (ACUTE ONLY): 25 min  Past Medical History:  Past Medical History:  Diagnosis Date  . Bronchiectasis   . COPD (chronic obstructive pulmonary disease) ([place])   . Degenerative disk disease   . Eczema   . Gastric ulcer   . GERD (gastroesophageal reflux disease)   . Hiatal hernia   . Hyperlipidemia   . Hypertension   . Hypothyroidism   . Osteoarthritis   . Polymyalgia rheumatica ([place])   . Pulmonary nodule, right   . Vertebrobasilar insufficiency    Past Surgical History:  Past Surgical History:  Procedure Laterality Date  . BREAST CYST EXCISION     right  . broken tail bone    . CAROTID ENDARTERECTOMY     right  . RHINOPLASTY    . TOTAL ABDOMINAL HYSTERECTOMY     HPI:  86 yo female adm to [facility] with AMS, found to have UTI. Pt has h/o recurrent UTIS' concern for pna on chest imaging.  Pt has dementia and resides with her daughter.  Daughter reports pt has pulmonary fibrosis due to her COPD and this is often mistaken for pna.  Swallow eval ordered. Pt had UGI study done in 2004 showing reflux and patulous gastroesophageal sphincter.  Daughter denies pt having dysphagia but shd does not wear dentues so avoid salads, etc.   Assessment / Plan / Recommendation Clinical Impression  Functional oropharyngeal swallow ability based on clinical swallow evaluation.  Pt CN exam unremarkable for swallow neuromuscular but pt has h/o GERD.  She is taking a PPI every day per her daughter.  [place] 3 ounce water test passed - but subtle throat clear after.  Pt able to self feed and swallow was timely with clear voice t/o and no oral retention.  Recommend continue diet avoiding hard items.  Educated pt and daughter to recommendations and changes in gustation with  dementia including sweet last taste intact.  Also advised pt take medications with puree - if problematic.  Large issue is pt not consuming water - per daughter.  All education completed.  No SlP follow up needed. SLP Visit Diagnosis: Dysphagia, unspecified (R13.10)    Aspiration Risk  No limitations    Diet Recommendation Regular;Thin liquid   Liquid Administration via: Cup;Straw Medication Administration: Whole meds with liquid(with puree if problematic) Supervision: Patient able to self feed Compensations: Slow rate;Small sips/bites Postural Changes: Remain upright for at least 30 minutes after po intake;Seated upright at 90 degrees    Other  Recommendations Oral Care Recommendations: Oral care BID   Follow up Recommendations None      Frequency and Duration      n/a      Prognosis   good    Swallow Study   General HPI: 86 yo female adm to [facility] with AMS, found to have UTI. Pt has h/o recurrent UTIS' concern for pna on chest imaging.  Pt has dementia and resides with her daughter.  Daughter reports pt has pulmonary fibrosis due to her COPD and this is often mistaken for pna.  Swallow eval ordered. Pt had UGI study done in 2004 showing reflux and patulous gastroesophageal sphincter.  Daughter denies pt having dysphagia but shd does not wear dentues so avoid salads, etc. Type of Study: Bedside Swallow Evaluation Diet Prior to this  Study: Regular;Thin liquids Temperature Spikes Noted: No Respiratory Status: Room air History of Recent Intubation: No Behavior/Cognition: Alert;Cooperative;Pleasant mood Oral Cavity Assessment: Within Functional Limits Oral Care Completed by SLP: No Oral Cavity - Dentition: Edentulous Vision: Functional for self-feeding Self-Feeding Abilities: Able to feed self Patient Positioning: Upright in bed Baseline Vocal Quality: Normal Volitional Cough: Strong Volitional Swallow: Unable to elicit    Oral/Motor/Sensory Function Overall Oral Motor/Sensory  Function: Within functional limits   Ice Chips Ice chips: Not tested   Thin Liquid Thin Liquid: Within functional limits Presentation: Self Fed;Straw    Nectar Thick Nectar Thick Liquid: Not tested   Honey Thick Honey Thick Liquid: Not tested   Puree Puree: Within functional limits Presentation: Self Fed;Spoon   Solid     Solid: Within functional limits Presentation: Self Fed      Jenna Carrillo 10/25/2019,12:01 PM    Jenna Infante, MS [facility] SLP Acute Rehab Services Office [phone]

## 2019-10-25 NOTE — TOC Initial Note (Signed)
Transition of Care [facility]) - Initial/Assessment Note    Patient Details  Name: Jenna Carrillo MRN: [id number] Date of Birth: April 27, 1931  Transition of Care [facility]) CM/SW Contact:    Ida Rogue, LCSW Phone Number: 10/25/2019, 3:12 PM  Clinical Narrative:  Spoke to Clydie Braun with [facility] who states patient was termed with them a week ago.  She had advanced to the point she did not need PT.  However, they are happy to pick up again since she had a set back. TOC will continue to follow during the course of hospitalization.                  Expected Discharge Plan: Home w Home Health Services Barriers to Discharge: No Barriers Identified   Patient Goals and CMS Choice        Expected Discharge Plan and Services Expected Discharge Plan: Home w Home Health Services                                              Prior Living Arrangements/Services                       Activities of Daily Living Home Assistive Devices/Equipment: Eyeglasses, Shower chair with back, Environmental consultant (specify type) ADL Screening (condition at time of admission) Patient's cognitive ability adequate to safely complete daily activities?: Yes Is the patient deaf or have difficulty hearing?: No Does the patient have difficulty seeing, even when wearing glasses/contacts?: No Does the patient have difficulty concentrating, remembering, or making decisions?: Yes Patient able to express need for assistance with ADLs?: Yes Does the patient have difficulty dressing or bathing?: Yes Independently performs ADLs?: Yes (appropriate for developmental age) Does the patient have difficulty walking or climbing stairs?: Yes Weakness of Legs: Both Weakness of Arms/Hands: None  Permission Sought/Granted                  Emotional Assessment              Admission diagnosis:  Hypoxia [R09.02] Sepsis due to pneumonia (HCC) [J18.9, A41.9] Sepsis, due to unspecified organism, unspecified whether acute  organ dysfunction present [facility]) [A41.9] Patient Active Problem List   Diagnosis Date Noted  . Acute respiratory failure with hypoxia and hypercapnia (HCC) 10/23/2019  . Sepsis secondary to UTI (HCC) 09/21/2019  . Proteus mirabilis infection 09/18/2019  . Acute lower UTI 09/18/2019  . Metabolic encephalopathy 09/02/2019  . Acute metabolic encephalopathy 09/01/2019  . Leukocytosis 09/01/2019  . Acute systolic heart failure (HCC) 01/28/2019  . AF (paroxysmal atrial fibrillation) (HCC)   . Current chronic use of systemic steroids 01/25/2019  . Encephalopathy 01/25/2019  . Fall from slip, trip, or stumble, initial encounter   . Generalized weakness   . Sepsis due to pneumonia (HCC) 01/24/2019  . Bronchiectasis with (acute) exacerbation (HCC) 12/20/2018  . Lobar pneumonia, unspecified organism (HCC) 02/15/2018  . Nuclear sclerosis of both eyes 03/05/2016  . Glaucoma suspect, bilateral 03/05/2016  . CAP (community acquired pneumonia) 11/12/2015  . Pulmonary nodules 10/12/2015  . Cough 02/03/2014  . Macular degeneration, left eye 12/28/2013  . Glaucoma suspect, both eyes 12/28/2013  . Allergic rhinitis 11/12/2012  . Nuclear cataract 08/08/2011  . BRONCHIECTASIS 03/09/2009  . COPD (chronic obstructive pulmonary disease) (HCC) 03/09/2009  . Essential hypertension 01/31/2009  . Polymyalgia rheumatica (HCC) 01/31/2009   PCP:  Shon Baton, MD Pharmacy:   [facility] [address] - [place], [place]. [place] [address] Phone: [phone] Fax: [phone]  [id number] - [place], [place] - [address] [place]  [address] Phone: [phone] Fax: [phone]     Social Determinants of Health (SDOH) Interventions    Readmission Risk Interventions No flowsheet data found.

## 2019-10-26 ENCOUNTER — Inpatient Hospital Stay (HOSPITAL_COMMUNITY): Payer: Medicare Other

## 2019-10-26 LAB — BASIC METABOLIC PANEL
Anion gap: 5 (ref 5–15)
BUN: 17 mg/dL (ref 8–23)
CO2: 32 mmol/L (ref 22–32)
Calcium: 8.3 mg/dL — ABNORMAL LOW (ref 8.9–10.3)
Chloride: 104 mmol/L (ref 98–111)
Creatinine, Ser: 0.74 mg/dL (ref 0.44–1.00)
GFR calc Af Amer: >60 mL/min (ref >60)
GFR calc non Af Amer: >60 mL/min (ref >60)
Glucose, Bld: 90 mg/dL (ref 70–99)
Potassium: 3.4 mmol/L — ABNORMAL LOW (ref 3.5–5.1)
Sodium: 141 mmol/L (ref 135–145)

## 2019-10-26 LAB — CBC WITH DIFFERENTIAL/PLATELET
Abs Immature Granulocytes: 0.04 10*3/uL (ref 0.00–0.07)
Basophils Absolute: 0 10*3/uL (ref 0.0–0.1)
Basophils Relative: 0 %
Eosinophils Absolute: 0.5 10*3/uL (ref 0.0–0.5)
Eosinophils Relative: 5 %
HCT: 32.2 % — ABNORMAL LOW (ref 36.0–46.0)
Hemoglobin: 10.1 g/dL — ABNORMAL LOW (ref 12.0–15.0)
Immature Granulocytes: 0 %
Lymphocytes Relative: 12 %
Lymphs Abs: 1.1 10*3/uL (ref 0.7–4.0)
MCH: 30.7 pg (ref 26.0–34.0)
MCHC: 31.4 g/dL (ref 30.0–36.0)
MCV: 97.9 fL (ref 80.0–100.0)
Monocytes Absolute: 0.7 10*3/uL (ref 0.1–1.0)
Monocytes Relative: 8 %
Neutro Abs: 6.9 10*3/uL (ref 1.7–7.7)
Neutrophils Relative %: 75 %
Platelets: 185 10*3/uL (ref 150–400)
RBC: 3.29 MIL/uL — ABNORMAL LOW (ref 3.87–5.11)
RDW: 13.8 % (ref 11.5–15.5)
WBC: 9.2 10*3/uL (ref 4.0–10.5)
nRBC: 0 % (ref 0.0–0.2)

## 2019-10-26 LAB — CULTURE, BLOOD (ROUTINE X 2)

## 2019-10-26 MED ORDER — FUROSEMIDE 20 MG PO TABS
20.0000 mg | ORAL_TABLET | Freq: Once | ORAL | Status: AC
  Administered 2019-10-26: 20 mg via ORAL
  Filled 2019-10-26: qty 1

## 2019-10-26 MED ORDER — POTASSIUM CHLORIDE CRYS ER 10 MEQ PO TBCR
10.0000 meq | EXTENDED_RELEASE_TABLET | Freq: Once | ORAL | Status: AC
  Administered 2019-10-26: 10 meq via ORAL
  Filled 2019-10-26: qty 1

## 2019-10-26 MED ORDER — CIPROFLOXACIN HCL 500 MG PO TABS
500.0000 mg | ORAL_TABLET | Freq: Two times a day (BID) | ORAL | 0 refills | Status: AC
Start: 2019-10-26 — End: 2019-10-30

## 2019-10-26 MED ORDER — FUROSEMIDE 10 MG/ML IJ SOLN: 20.0000 mg | Freq: Once | INTRAMUSCULAR | Status: DC

## 2019-10-26 NOTE — Progress Notes (Addendum)
Pt ambulated hall with NT. Pulse ox monitored during ambulation, pt maintained 90-92% on room air. MD notified.

## 2019-10-26 NOTE — Progress Notes (Signed)
Went over discharge instructions w/ patient daughter Ronald Pippins.

## 2019-10-26 NOTE — Care Management Important Message (Signed)
Important Message  Patient Details IM Letter given to [name] SW Case Manager to present to the Patient Name: Jenna Carrillo MRN: [id number] Date of Birth: Apr 28, 1931   Medicare Important Message Given:  Yes     [name] 10/26/2019, 9:38 AM

## 2019-10-26 NOTE — Discharge Summary (Signed)
Physician Discharge Summary  Jenna Carrillo SNK:[id number] DOB: 01/01/1931   PCP: Creola Corn, MD  Admit date: 10/23/2019 Discharge date: 10/26/2019 Length of Stay: 3 days   Code Status: DNR  Admitted From:  Home Discharged to:   Home Home Health: PT Equipment/Devices:  None Discharge Condition:  Stable  Recommendations for Outpatient Follow-up   1. Follow up with PCP in 1 week 2. Follow up BMP/CBC  3. Patient curious about prophylactic UTI treatment as she had multiple UTIs since April.  May need urology eval  Hospital Summary  This is an 86 year old female with past medical history of COPD, RA, paroxysmal atrial fibrillation, myalgia rheumatica, hypothyroidism, hypertension, dyslipidemia, GERD, dementia, recurrent UTIs who presented to the ED with complaints of flank pain and urinary incontinence and dysuria along with confusion per daughter.  Initial BP 103/92, HR 92, RR 18, T 90 9.33F, O2 at 94%, K5.7, Covid negative, UA positive and CT chest and abdomen with small pleural effusions, stable mild to moderate scarring atelectasis in the right middle lobe and 6 mm calcified nodule right upper lobe.  She was admitted for sepsis secondary to UTI with concern for encephalopathy.  Initially received CAP targeted therapy with ceftriaxone and azithromycin which was changed to ceftriaxone for UTI after CAP was ruled out.  Urine culture grew Pseudomonas biotic was changed to cefepime.  She was afebrile and hemodynamically stable with resolved leukocytosis and was discharged with ciprofloxacin for total of 5 days antibiotics and recommended outpatient follow-up with her PCP and considering urology eval.  A & P   Principal Problem:   Sepsis secondary to UTI [facility]) Active Problems:   Essential hypertension   COPD (chronic obstructive pulmonary disease) (HCC)   Polymyalgia rheumatica (HCC)   AF (paroxysmal atrial fibrillation) (HCC)   Acute metabolic encephalopathy   Leukocytosis   Acute  respiratory failure with hypoxia and hypercapnia (HCC)   1. Severe sepsis secondary to Pseudomonas UTI with encephalopathy, hypotension, lactic acidosis, shock liver 1. Vital stabilized and patient tolerating room air with negative blood cultures 2. CT abdomen without signs of hydronephrosis 3. Discharged on Cipro for total 5 days antibiotics and CBC and BMP ordered with outpatient follow-up 4. Encouraged to increase her water intake 2. Acute hypoxic respiratory failure, multifactorial: Atelectasis, small pleural effusions 1. Now on room air 2. Continue incentive spirometer at discharge 3. Continue regular diet per CLP 4. Continue home Lasix 3. Paroxysmal atrial fibrillation 1. Continue Coreg and Eliquis 4. Hypertension, stable 5. Hypothyroid on levothyroxine 6. Hyperlipidemia not on statin at home 1. Outpatient follow-up 7. Dementia, at baseline, continue amitriptyline 8. Rheumatoid arthritis/polymyalgia rheumatica, stable on prednisone and ibuprofen    Consultants  . None  Procedures  . None  Antibiotics   Anti-infectives (From admission, onward)   Start     Dose/Rate Route Frequency Ordered Stop   10/26/19 0000  ciprofloxacin (CIPRO) 500 MG tablet     500 mg Oral 2 times daily 10/26/19 1326 10/30/19 2359   10/25/19 1200  ceFEPIme (MAXIPIME) 2 g in sodium chloride 0.9 % 100 mL IVPB     2 g 200 mL/hr over 30 Minutes Intravenous Every 12 hours 10/25/19 1110     10/23/19 1800  cefTRIAXone (ROCEPHIN) 1 g in sodium chloride 0.9 % 100 mL IVPB  Status:  Discontinued     1 g 200 mL/hr over 30 Minutes Intravenous Every 24 hours 10/23/19 1606 10/25/19 1110   10/23/19 1800  azithromycin (ZITHROMAX) 500 mg in sodium chloride 0.9 %  250 mL IVPB  Status:  Discontinued     500 mg 250 mL/hr over 60 Minutes Intravenous Every 24 hours 10/23/19 1606 10/24/19 1523   10/23/19 1145  ceFEPIme (MAXIPIME) 2 g in sodium chloride 0.9 % 100 mL IVPB     2 g 200 mL/hr over 30 Minutes Intravenous   Once 10/23/19 1130 10/23/19 1319       Subjective  Patient seen and examined at bedside no acute distress and resting comfortably.  No events overnight.  Tolerating diet. In good spirits and anticipating discharge.  Daughter at bedside  Denies any chest pain, shortness of breath, fever, nausea, vomiting, urinary or bowel complaints. Otherwise ROS negative    Objective   Discharge Exam: Vitals:   10/26/19 0815 10/26/19 1038  BP:  (!) 149/69  Pulse:  77  Resp:  17  Temp:  98.1 F (36.7 C)  SpO2: 96%    Vitals:   10/25/19 2111 10/26/19 0430 10/26/19 0815 10/26/19 1038  BP: (!) 181/78 (!) 149/69  (!) 149/69  Pulse: 70 77  77  Resp: 17 17  17   Temp: 98.2 F (36.8 C) 98.1 F (36.7 C)  98.1 F (36.7 C)  TempSrc:    Oral  SpO2: 99% 99% 96%   Weight:    51.6 kg  Height:    5' 4.02" (1.626 m)    Physical Exam Vitals and nursing note reviewed. Exam conducted with a chaperone present.  Constitutional:      Appearance: Normal appearance.  HENT:     Head: Normocephalic and atraumatic.  Eyes:     Conjunctiva/sclera: Conjunctivae normal.  Cardiovascular:     Rate and Rhythm: Normal rate and regular rhythm.  Pulmonary:     Effort: Pulmonary effort is normal.     Breath sounds: Normal breath sounds.  Abdominal:     General: Abdomen is flat.     Palpations: Abdomen is soft.  Musculoskeletal:        General: No swelling or tenderness.  Skin:    Coloration: Skin is not jaundiced or pale.  Neurological:     Mental Status: She is alert. Mental status is at baseline.  Psychiatric:        Mood and Affect: Mood normal.        Behavior: Behavior normal.       The results of significant diagnostics from this hospitalization (including imaging, microbiology, ancillary and laboratory) are listed below for reference.     Microbiology: Recent Results (from the past 240 hour(s))  Urine culture     Status: Abnormal   Collection Time: 10/23/19 11:14 AM   Specimen: Urine,  Random  Result Value Ref Range Status   Specimen Description   Final    URINE, RANDOM Performed at [facility], [address], [place], [place]    Special Requests   Final    NONE Performed at [facility], [address] [place], [place]    Culture 90,000 COLONIES/mL PSEUDOMONAS AERUGINOSA (A)  Final   Report Status 10/25/2019 FINAL  Final   Organism ID, Bacteria PSEUDOMONAS AERUGINOSA (A)  Final      Susceptibility   Pseudomonas aeruginosa - MIC*    CEFTAZIDIME 4 SENSITIVE Sensitive     CIPROFLOXACIN <=0.25 SENSITIVE Sensitive     GENTAMICIN <=1 SENSITIVE Sensitive     IMIPENEM 1 SENSITIVE Sensitive     PIP/TAZO 8 SENSITIVE Sensitive     CEFEPIME 2 SENSITIVE Sensitive     *  90,000 COLONIES/mL PSEUDOMONAS AERUGINOSA  Blood Culture (routine x 2)     Status: Abnormal   Collection Time: 10/23/19 11:27 AM   Specimen: BLOOD LEFT HAND  Result Value Ref Range Status   Specimen Description   Final    BLOOD LEFT HAND LEFT HAND Performed at [facility], [address] [place], [place] [address]    Special Requests   Final    BOTTLES DRAWN AEROBIC AND ANAEROBIC Blood Culture results may not be optimal due to an inadequate volume of blood received in culture bottles Performed at [facility], [address] [place], [place] [address]    Culture  Setup Time   Final    GRAM POSITIVE COCCI IN CLUSTERS IN BOTH AEROBIC AND ANAEROBIC BOTTLES CRITICAL RESULT CALLED TO, READ BACK BY AND VERIFIED WITH: Sophronia Simas PharmD 16:50 10/24/19 ([email or web address])    Culture (A)  Final    STAPHYLOCOCCUS SPECIES (COAGULASE NEGATIVE) THE SIGNIFICANCE OF ISOLATING THIS ORGANISM FROM A SINGLE SET OF BLOOD CULTURES WHEN MULTIPLE SETS ARE DRAWN IS UNCERTAIN. PLEASE NOTIFY THE MICROBIOLOGY DEPARTMENT WITHIN ONE WEEK IF SPECIATION AND SENSITIVITIES ARE REQUIRED. Performed at [facility], [address]  [place], [place] [address]    Report Status 10/26/2019 FINAL  Final  Blood Culture ID Panel (Reflexed)     Status: Abnormal   Collection Time: 10/23/19 11:27 AM  Result Value Ref Range Status   Enterococcus species NOT DETECTED NOT DETECTED Final   Listeria monocytogenes NOT DETECTED NOT DETECTED Final   Staphylococcus species DETECTED (A) NOT DETECTED Final    Comment: Methicillin (oxacillin) resistant coagulase negative staphylococcus. Possible blood culture contaminant (unless isolated from more than one blood culture draw or clinical case suggests pathogenicity). No antibiotic treatment is indicated for blood  culture contaminants. CRITICAL RESULT CALLED TO, READ BACK BY AND VERIFIED WITH: Sophronia Simas PharmD 16:50 10/24/19 ([email or web address])    Staphylococcus aureus (BCID) NOT DETECTED NOT DETECTED Final   Methicillin resistance DETECTED (A) NOT DETECTED Final    Comment: CRITICAL RESULT CALLED TO, READ BACK BY AND VERIFIED WITH: Sophronia Simas PharmD 16:50 10/24/19 ([email or web address])    Streptococcus species NOT DETECTED NOT DETECTED Final   Streptococcus agalactiae NOT DETECTED NOT DETECTED Final   Streptococcus pneumoniae NOT DETECTED NOT DETECTED Final   Streptococcus pyogenes NOT DETECTED NOT DETECTED Final   Acinetobacter baumannii NOT DETECTED NOT DETECTED Final   Enterobacteriaceae species NOT DETECTED NOT DETECTED Final   Enterobacter cloacae complex NOT DETECTED NOT DETECTED Final   Escherichia coli NOT DETECTED NOT DETECTED Final   Klebsiella oxytoca NOT DETECTED NOT DETECTED Final   Klebsiella pneumoniae NOT DETECTED NOT DETECTED Final   Proteus species NOT DETECTED NOT DETECTED Final   Serratia marcescens NOT DETECTED NOT DETECTED Final   Haemophilus influenzae NOT DETECTED NOT DETECTED Final   Neisseria meningitidis NOT DETECTED NOT DETECTED Final   Pseudomonas aeruginosa NOT DETECTED NOT DETECTED Final   Candida albicans NOT DETECTED NOT DETECTED Final   Candida glabrata NOT DETECTED NOT DETECTED  Final   Candida krusei NOT DETECTED NOT DETECTED Final   Candida parapsilosis NOT DETECTED NOT DETECTED Final   Candida tropicalis NOT DETECTED NOT DETECTED Final    Comment: Performed at [facility], [address] [place], [place] [address]  SARS Coronavirus 2 by RT PCR (hospital order, performed in [facility] hospital lab) Nasopharyngeal Nasopharyngeal Swab     Status: None   Collection Time: 10/23/19 11:30 AM  Specimen: Nasopharyngeal Swab  Result Value Ref Range Status   SARS Coronavirus 2 NEGATIVE NEGATIVE Final    Comment: (NOTE) SARS-CoV-2 target nucleic acids are NOT DETECTED. The SARS-CoV-2 RNA is generally detectable in upper and lower respiratory specimens during the acute phase of infection. The lowest concentration of SARS-CoV-2 viral copies this assay can detect is 250 copies / mL. A negative result does not preclude SARS-CoV-2 infection and should not be used as the sole basis for treatment or other patient management decisions.  A negative result may occur with improper specimen collection / handling, submission of specimen other than nasopharyngeal swab, presence of viral mutation(s) within the areas targeted by this assay, and inadequate number of viral copies (<250 copies / mL). A negative result must be combined with clinical observations, patient history, and epidemiological information. Fact Sheet for Patients:   BoilerBrush.com.cy Fact Sheet for Healthcare Providers: https://pope.com/ This test is not yet approved or cleared  by the [place] FDA and has been authorized for detection and/or diagnosis of SARS-CoV-2 by FDA under an Emergency Use Authorization (EUA).  This EUA will remain in effect (meaning this test can be used) for the duration of the COVID-19 declaration under Section 564(b)(1) of the Act, 21 U.S.C. section 360bbb-3(b)(1), unless the authorization is terminated or revoked  sooner. Performed at [facility], [address] [place], [place] [address]   Culture, blood (routine x 2)     Status: None (Preliminary result)   Collection Time: 10/25/19  3:00 PM   Specimen: BLOOD LEFT FOREARM  Result Value Ref Range Status   Specimen Description   Final    BLOOD LEFT FOREARM Performed at [facility], [address], [place], [place] [address]    Special Requests   Final    BOTTLES DRAWN AEROBIC ONLY Blood Culture adequate volume Performed at [facility], [address], [place], [place] [address]    Culture   Final    NO GROWTH < 24 HOURS Performed at [facility], [address] [place], [place] [address]    Report Status PENDING  Incomplete  Culture, blood (routine x 2)     Status: None (Preliminary result)   Collection Time: 10/25/19  3:00 PM   Specimen: BLOOD LEFT HAND  Result Value Ref Range Status   Specimen Description   Final    BLOOD LEFT HAND Performed at [facility], [address] [place], [place] [address]    Special Requests   Final    BOTTLES DRAWN AEROBIC ONLY Blood Culture adequate volume Performed at [facility], [address] [place], [place] [address]    Culture   Final    NO GROWTH < 24 HOURS Performed at [facility], [address], [place], [place] [address]    Report Status PENDING  Incomplete     Labs: BNP (last 3 results) Recent Labs    01/29/19 0522 09/21/19 1421  BNP 1,930.6* 1,475.7*   Basic Metabolic Panel: Recent Labs  Lab 10/23/19 1114 10/24/19 0557 10/25/19 0508 10/26/19 0326  NA 138 138 139 141  K 5.7* 3.7 3.4* 3.4*  CL 105 107 108 104  CO2 32  GLUCOSE 91 95 96 90  BUN CREATININE 1.03* 0.85 0.95 0.74  CALCIUM 7.7* 7.8* 8.3* 8.3*   Liver Function Tests: Recent Labs  Lab 10/23/19 1114 10/24/19 0557  AST 173* 66*  ALT 82* 52*  ALKPHOS 119 93  BILITOT 1.7*  0.7  PROT 5.8* 4.8*  ALBUMIN 3.0* 2.4*   No results for input(s): LIPASE, AMYLASE in the last 168 hours. No results for input(s): AMMONIA in the last 168 hours. CBC: Recent Labs  Lab 10/23/19 1114 10/24/19 0557 10/25/19 0508 10/26/19 0326  WBC 21.7* 20.0* 13.0* 9.2  NEUTROABS 20.0*  --  10.6* 6.9  HGB 10.2* 8.9* 9.5* 10.1*  HCT 33.0* 29.1* 30.5* 32.2*  MCV 101.9* 101.4* 100.0 97.9  PLT 188 160 162 185   Cardiac Enzymes: Recent Labs  Lab 10/23/19 1222  CKTOTAL 104   BNP: Invalid input(s): POCBNP CBG: No results for input(s): GLUCAP in the last 168 hours. D-Dimer No results for input(s): DDIMER in the last 72 hours. Hgb A1c No results for input(s): HGBA1C in the last 72 hours. Lipid Profile No results for input(s): CHOL, HDL, LDLCALC, TRIG, CHOLHDL, LDLDIRECT in the last 72 hours. Thyroid function studies No results for input(s): TSH, T4TOTAL, T3FREE, THYROIDAB in the last 72 hours.  Invalid input(s): FREET3 Anemia work up No results for input(s): VITAMINB12, FOLATE, FERRITIN, TIBC, IRON, RETICCTPCT in the last 72 hours. Urinalysis    Component Value Date/Time   COLORURINE YELLOW 10/23/2019 1114   APPEARANCEUR CLEAR 10/23/2019 1114   LABSPEC 1.014 10/23/2019 1114   PHURINE 5.0 10/23/2019 1114   GLUCOSEU NEGATIVE 10/23/2019 1114   HGBUR MODERATE (A) 10/23/2019 1114   BILIRUBINUR NEGATIVE 10/23/2019 1114   KETONESUR NEGATIVE 10/23/2019 1114   PROTEINUR NEGATIVE 10/23/2019 1114   NITRITE NEGATIVE 10/23/2019 1114   LEUKOCYTESUR TRACE (A) 10/23/2019 1114   Sepsis Labs Invalid input(s): PROCALCITONIN,  WBC,  LACTICIDVEN Microbiology Recent Results (from the past 240 hour(s))  Urine culture     Status: Abnormal   Collection Time: 10/23/19 11:14 AM   Specimen: Urine, Random  Result Value Ref Range Status   Specimen Description   Final    URINE, RANDOM Performed at [facility], [address] [place], [place] [address]    Special Requests    Final    NONE Performed at [facility], [address] [place], [place] [address]    Culture 90,000 COLONIES/mL PSEUDOMONAS AERUGINOSA (A)  Final   Report Status 10/25/2019 FINAL  Final   Organism ID, Bacteria PSEUDOMONAS AERUGINOSA (A)  Final      Susceptibility   Pseudomonas aeruginosa - MIC*    CEFTAZIDIME 4 SENSITIVE Sensitive     CIPROFLOXACIN <=0.25 SENSITIVE Sensitive     GENTAMICIN <=1 SENSITIVE Sensitive     IMIPENEM 1 SENSITIVE Sensitive     PIP/TAZO 8 SENSITIVE Sensitive     CEFEPIME 2 SENSITIVE Sensitive     * 90,000 COLONIES/mL PSEUDOMONAS AERUGINOSA  Blood Culture (routine x 2)     Status: Abnormal   Collection Time: 10/23/19 11:27 AM   Specimen: BLOOD LEFT HAND  Result Value Ref Range Status   Specimen Description   Final    BLOOD LEFT HAND LEFT HAND Performed at [facility], [address], [place], [place] [address]    Special Requests   Final    BOTTLES DRAWN AEROBIC AND ANAEROBIC Blood Culture results may not be optimal due to an inadequate volume of blood received in culture bottles Performed at [facility], [address], [place], [place] [address]    Culture  Setup Time   Final    GRAM POSITIVE COCCI IN CLUSTERS IN BOTH AEROBIC AND ANAEROBIC  BOTTLES CRITICAL RESULT CALLED TO, READ BACK BY AND VERIFIED WITH: Sophronia Simas PharmD 16:50 10/24/19 ([email or web address])    Culture (A)  Final    STAPHYLOCOCCUS SPECIES (COAGULASE NEGATIVE) THE SIGNIFICANCE OF ISOLATING THIS ORGANISM FROM A SINGLE SET OF BLOOD CULTURES WHEN MULTIPLE SETS ARE DRAWN IS UNCERTAIN. PLEASE NOTIFY THE MICROBIOLOGY DEPARTMENT WITHIN ONE WEEK IF SPECIATION AND SENSITIVITIES ARE REQUIRED. Performed at [facility], [address], [place], [place] [address]    Report Status 10/26/2019 FINAL  Final  Blood Culture ID Panel (Reflexed)     Status: Abnormal   Collection Time: 10/23/19 11:27 AM  Result Value Ref Range Status   Enterococcus  species NOT DETECTED NOT DETECTED Final   Listeria monocytogenes NOT DETECTED NOT DETECTED Final   Staphylococcus species DETECTED (A) NOT DETECTED Final    Comment: Methicillin (oxacillin) resistant coagulase negative staphylococcus. Possible blood culture contaminant (unless isolated from more than one blood culture draw or clinical case suggests pathogenicity). No antibiotic treatment is indicated for blood  culture contaminants. CRITICAL RESULT CALLED TO, READ BACK BY AND VERIFIED WITH: Sophronia Simas PharmD 16:50 10/24/19 ([email or web address])    Staphylococcus aureus (BCID) NOT DETECTED NOT DETECTED Final   Methicillin resistance DETECTED (A) NOT DETECTED Final    Comment: CRITICAL RESULT CALLED TO, READ BACK BY AND VERIFIED WITH: Sophronia Simas PharmD 16:50 10/24/19 ([email or web address])    Streptococcus species NOT DETECTED NOT DETECTED Final   Streptococcus agalactiae NOT DETECTED NOT DETECTED Final   Streptococcus pneumoniae NOT DETECTED NOT DETECTED Final   Streptococcus pyogenes NOT DETECTED NOT DETECTED Final   Acinetobacter baumannii NOT DETECTED NOT DETECTED Final   Enterobacteriaceae species NOT DETECTED NOT DETECTED Final   Enterobacter cloacae complex NOT DETECTED NOT DETECTED Final   Escherichia coli NOT DETECTED NOT DETECTED Final   Klebsiella oxytoca NOT DETECTED NOT DETECTED Final   Klebsiella pneumoniae NOT DETECTED NOT DETECTED Final   Proteus species NOT DETECTED NOT DETECTED Final   Serratia marcescens NOT DETECTED NOT DETECTED Final   Haemophilus influenzae NOT DETECTED NOT DETECTED Final   Neisseria meningitidis NOT DETECTED NOT DETECTED Final   Pseudomonas aeruginosa NOT DETECTED NOT DETECTED Final   Candida albicans NOT DETECTED NOT DETECTED Final   Candida glabrata NOT DETECTED NOT DETECTED Final   Candida krusei NOT DETECTED NOT DETECTED Final   Candida parapsilosis NOT DETECTED NOT DETECTED Final   Candida tropicalis NOT DETECTED NOT DETECTED Final    Comment: Performed at [facility], [address], [place], [place] [address]  SARS Coronavirus 2 by RT PCR (hospital order, performed in [facility] hospital lab) Nasopharyngeal Nasopharyngeal Swab     Status: None   Collection Time: 10/23/19 11:30 AM   Specimen: Nasopharyngeal Swab  Result Value Ref Range Status   SARS Coronavirus 2 NEGATIVE NEGATIVE Final    Comment: (NOTE) SARS-CoV-2 target nucleic acids are NOT DETECTED. The SARS-CoV-2 RNA is generally detectable in upper and lower respiratory specimens during the acute phase of infection. The lowest concentration of SARS-CoV-2 viral copies this assay can detect is 250 copies / mL. A negative result does not preclude SARS-CoV-2 infection and should not be used as the sole basis for treatment or other patient management decisions.  A negative result may occur with improper specimen collection / handling, submission of specimen other than nasopharyngeal swab, presence of viral mutation(s) within the areas targeted by this assay, and inadequate number of viral copies (<250 copies / mL). A negative result must be combined  with clinical observations, patient history, and epidemiological information. Fact Sheet for Patients:   BoilerBrush.com.cy Fact Sheet for Healthcare Providers: https://pope.com/ This test is not yet approved or cleared  by the [place] FDA and has been authorized for detection and/or diagnosis of SARS-CoV-2 by FDA under an Emergency Use Authorization (EUA).  This EUA will remain in effect (meaning this test can be used) for the duration of the COVID-19 declaration under Section 564(b)(1) of the Act, 21 U.S.C. section 360bbb-3(b)(1), unless the authorization is terminated or revoked sooner. Performed at [facility], [address], [place], [place] [address]   Culture, blood (routine x 2)     Status: None (Preliminary result)   Collection Time: 10/25/19  3:00 PM   Specimen:  BLOOD LEFT FOREARM  Result Value Ref Range Status   Specimen Description   Final    BLOOD LEFT FOREARM Performed at [facility], [address], [place], [place] [address]    Special Requests   Final    BOTTLES DRAWN AEROBIC ONLY Blood Culture adequate volume Performed at [facility], [address] [place], [place] [address]    Culture   Final    NO GROWTH < 24 HOURS Performed at [facility], [address] [place], [place] [address]    Report Status PENDING  Incomplete  Culture, blood (routine x 2)     Status: None (Preliminary result)   Collection Time: 10/25/19  3:00 PM   Specimen: BLOOD LEFT HAND  Result Value Ref Range Status   Specimen Description   Final    BLOOD LEFT HAND Performed at [facility], [address], [place], [place] [address]    Special Requests   Final    BOTTLES DRAWN AEROBIC ONLY Blood Culture adequate volume Performed at [facility], [address], [place], [place] [address]    Culture   Final    NO GROWTH < 24 HOURS Performed at [facility], [address], [place], [place] [address]    Report Status PENDING  Incomplete    Discharge Instructions     Discharge Instructions    Diet - low sodium heart healthy   Complete by: As directed    Discharge instructions   Complete by: As directed    -Take ciprofloxacin 500 mg twice daily to completion without skipping doses.  First dose is tonight, 10/26/2019. -Monitor for persistent diarrhea while taking this medication. -Get lab work in 1 week and follow-up with your primary care physician -Consider establishing care with a urologist for your recurrent urinary tract infection and stress incontinence -Drink plenty of water daily If you have any significant change or worsening of your symptoms please not hesitate to return to the ED   Increase activity slowly   Complete by: As directed      Allergies as  of 10/26/2019      Reactions   Sulfonamide Derivatives Rash      Medication List    STOP taking these medications   amoxicillin-clavulanate 500-125 MG tablet Commonly known as: Augmentin     TAKE these medications   AeroChamber MV inhaler Use as instructed   amitriptyline 50 MG tablet Commonly known as: ELAVIL Take 50 mg by mouth at bedtime.   apixaban 2.5 MG Tabs tablet Commonly known as: ELIQUIS Take 1 tablet (2.5 mg total) by mouth 2 (two) times daily.   carvedilol 12.5 MG tablet Commonly known as: COREG  Take 1 tablet (12.5 mg total) by mouth 2 (two) times daily with a meal.   ciprofloxacin 500 MG tablet Commonly known as: Cipro Take 1 tablet (500 mg total) by mouth 2 (two) times daily for 4 days.   furosemide 20 MG tablet Commonly known as: Lasix Take 1 tablet (20 mg total) by mouth daily as needed for fluid or edema (Weight gain of more than 3 pounds in 1 day or 5 pounds in 2 days).   ipratropium-albuterol 0.5-2.5 (3) MG/3ML Soln Commonly known as: DUONEB Inhale 3 mLs into the lungs 2 (two) times daily as needed for cough.   levalbuterol 45 MCG/ACT inhaler Commonly known as: XOPENEX HFA Inhale 1 puff into the lungs every 4 (four) hours as needed for wheezing.   levothyroxine 75 MCG tablet Commonly known as: SYNTHROID Take 1 tablet (75 mcg total) by mouth daily at 6 (six) AM.   multivitamin tablet Take 1 tablet by mouth daily.   omeprazole 20 MG capsule Commonly known as: PRILOSEC Take 20 mg by mouth daily.   predniSONE 1 MG tablet Commonly known as: DELTASONE Take 3 mg by mouth daily.   Spiriva HandiHaler 18 MCG inhalation capsule Generic drug: tiotropium PLACE 1 CAPSULE INTO THE INHALER AND INHALE DAILY What changed:   how much to take  how to take this  when to take this  additional instructions   Vitamin D3 25 MCG tablet Commonly known as: Vitamin D Take 1 tablet (1,000 Units total) by mouth daily.       Allergies  Allergen  Reactions  . Sulfonamide Derivatives Rash   Dispo: The patient is from: Home              Anticipated d/c is to: Home              Anticipated d/c date is: today              Patient currently is medically stable to d/c.       Time coordinating discharge: Over 30 minutes   SIGNED:   Harold Hedge, D.O. [facility] Pager: [phone]  10/26/2019, 1:39 PM

## 2019-10-26 NOTE — Progress Notes (Signed)
Pt is being discharged home. Discharge instructions including medications and follow up appointments given. Pt/daughter had no further questions at this time.

## 2019-10-30 LAB — CULTURE, BLOOD (ROUTINE X 2)
Culture: NO GROWTH
Culture: NO GROWTH
Special Requests: ADEQUATE
Special Requests: ADEQUATE

## 2020-03-31 ENCOUNTER — Other Ambulatory Visit: Payer: Self-pay | Admitting: Cardiovascular Disease

## 2020-04-02 NOTE — Telephone Encounter (Signed)
Prescription refill request for Eliquis received. Indication:  Atrial Fibrillation Last office visit: 06/2019  O'Neal Scr: 0.74  10/2019 Age: 86 Weight: 51.6 kg  Prescription refilled

## 2020-04-16 ENCOUNTER — Encounter (HOSPITAL_COMMUNITY): Payer: Self-pay

## 2020-04-16 ENCOUNTER — Inpatient Hospital Stay (HOSPITAL_COMMUNITY)
Admission: EM | Admit: 2020-04-16 | Discharge: 2020-04-20 | DRG: 871 | Disposition: A | Discharge status: Home Health | Payer: Medicare Other | Attending: Internal Medicine | Admitting: Internal Medicine

## 2020-04-16 ENCOUNTER — Other Ambulatory Visit: Payer: Self-pay

## 2020-04-16 ENCOUNTER — Emergency Department (HOSPITAL_COMMUNITY): Payer: Medicare Other

## 2020-04-16 DIAGNOSIS — R0902 Hypoxemia: Secondary | ICD-10-CM | POA: Diagnosis present

## 2020-04-16 DIAGNOSIS — J9811 Atelectasis: Secondary | ICD-10-CM | POA: Diagnosis present

## 2020-04-16 DIAGNOSIS — G9341 Metabolic encephalopathy: Secondary | ICD-10-CM

## 2020-04-16 DIAGNOSIS — J479 Bronchiectasis, uncomplicated: Secondary | ICD-10-CM | POA: Diagnosis present

## 2020-04-16 DIAGNOSIS — M069 Rheumatoid arthritis, unspecified: Secondary | ICD-10-CM | POA: Diagnosis present

## 2020-04-16 DIAGNOSIS — J470 Bronchiectasis with acute lower respiratory infection: Secondary | ICD-10-CM | POA: Diagnosis present

## 2020-04-16 DIAGNOSIS — R4182 Altered mental status, unspecified: Secondary | ICD-10-CM | POA: Diagnosis not present

## 2020-04-16 DIAGNOSIS — J189 Pneumonia, unspecified organism: Secondary | ICD-10-CM | POA: Diagnosis present

## 2020-04-16 DIAGNOSIS — E162 Hypoglycemia, unspecified: Secondary | ICD-10-CM | POA: Diagnosis not present

## 2020-04-16 DIAGNOSIS — Z7952 Long term (current) use of systemic steroids: Secondary | ICD-10-CM

## 2020-04-16 DIAGNOSIS — E039 Hypothyroidism, unspecified: Secondary | ICD-10-CM | POA: Diagnosis not present

## 2020-04-16 DIAGNOSIS — A419 Sepsis, unspecified organism: Secondary | ICD-10-CM | POA: Diagnosis not present

## 2020-04-16 DIAGNOSIS — I480 Paroxysmal atrial fibrillation: Secondary | ICD-10-CM | POA: Diagnosis present

## 2020-04-16 DIAGNOSIS — Z20822 Contact with and (suspected) exposure to covid-19: Secondary | ICD-10-CM | POA: Diagnosis present

## 2020-04-16 DIAGNOSIS — M353 Polymyalgia rheumatica: Secondary | ICD-10-CM

## 2020-04-16 DIAGNOSIS — G9340 Encephalopathy, unspecified: Secondary | ICD-10-CM

## 2020-04-16 DIAGNOSIS — R0602 Shortness of breath: Secondary | ICD-10-CM

## 2020-04-16 DIAGNOSIS — I10 Essential (primary) hypertension: Secondary | ICD-10-CM

## 2020-04-16 DIAGNOSIS — J690 Pneumonitis due to inhalation of food and vomit: Secondary | ICD-10-CM | POA: Diagnosis present

## 2020-04-16 DIAGNOSIS — F0390 Unspecified dementia without behavioral disturbance: Secondary | ICD-10-CM | POA: Diagnosis present

## 2020-04-16 LAB — CBC WITH DIFFERENTIAL/PLATELET
Abs Immature Granulocytes: 0.08 10*3/uL — ABNORMAL HIGH (ref 0.00–0.07)
Basophils Absolute: 0.1 10*3/uL (ref 0.0–0.1)
Basophils Relative: 0 %
Eosinophils Absolute: 0 10*3/uL (ref 0.0–0.5)
Eosinophils Relative: 0 %
HCT: 43.8 % (ref 36.0–46.0)
Hemoglobin: 13.9 g/dL (ref 12.0–15.0)
Immature Granulocytes: 1 %
Lymphocytes Relative: 9 %
Lymphs Abs: 1.2 10*3/uL (ref 0.7–4.0)
MCH: 31.2 pg (ref 26.0–34.0)
MCHC: 31.7 g/dL (ref 30.0–36.0)
MCV: 98.2 fL (ref 80.0–100.0)
Monocytes Absolute: 1.3 10*3/uL — ABNORMAL HIGH (ref 0.1–1.0)
Monocytes Relative: 9 %
Neutro Abs: 11.7 10*3/uL — ABNORMAL HIGH (ref 1.7–7.7)
Neutrophils Relative %: 81 %
Platelets: 305 10*3/uL (ref 150–400)
RBC: 4.46 MIL/uL (ref 3.87–5.11)
RDW: 13 % (ref 11.5–15.5)
WBC: 14.3 10*3/uL — ABNORMAL HIGH (ref 4.0–10.5)
nRBC: 0 % (ref 0.0–0.2)

## 2020-04-16 LAB — PROTIME-INR
INR: 1.2 (ref 0.8–1.2)
Prothrombin Time: 14.4 seconds (ref 11.4–15.2)

## 2020-04-16 LAB — LACTIC ACID, PLASMA
Lactic Acid, Venous: 1.1 mmol/L (ref 0.5–1.9)
Lactic Acid, Venous: 5.9 mmol/L (ref 0.5–1.9)
Lactic Acid, Venous: 1.9 mmol/L (ref 0.5–1.9)

## 2020-04-16 LAB — URINALYSIS, ROUTINE W REFLEX MICROSCOPIC
Bilirubin Urine: NEGATIVE
Glucose, UA: NEGATIVE mg/dL
Hgb urine dipstick: NEGATIVE
Ketones, ur: NEGATIVE mg/dL
Leukocytes,Ua: NEGATIVE
Nitrite: NEGATIVE
Protein, ur: NEGATIVE mg/dL
Specific Gravity, Urine: 1.01 (ref 1.005–1.030)
pH: 8 (ref 5.0–8.0)

## 2020-04-16 LAB — RESP PANEL BY RT-PCR (FLU A&B, COVID) ARPGX2
Influenza A by PCR: NEGATIVE
Influenza B by PCR: NEGATIVE
SARS Coronavirus 2 by RT PCR: NEGATIVE

## 2020-04-16 LAB — COMPREHENSIVE METABOLIC PANEL
ALT: 24 U/L (ref 0–44)
AST: 34 U/L (ref 15–41)
Albumin: 4 g/dL (ref 3.5–5.0)
Alkaline Phosphatase: 112 U/L (ref 38–126)
Anion gap: 11 (ref 5–15)
BUN: 15 mg/dL (ref 8–23)
CO2: 26 mmol/L (ref 22–32)
Calcium: 9 mg/dL (ref 8.9–10.3)
Chloride: 99 mmol/L (ref 98–111)
Creatinine, Ser: 1.01 mg/dL — ABNORMAL HIGH (ref 0.44–1.00)
GFR, Estimated: 54 mL/min — ABNORMAL LOW (ref >60)
Glucose, Bld: 117 mg/dL — ABNORMAL HIGH (ref 70–99)
Potassium: 3.9 mmol/L (ref 3.5–5.1)
Sodium: 136 mmol/L (ref 135–145)
Total Bilirubin: 0.5 mg/dL (ref 0.3–1.2)
Total Protein: 7.8 g/dL (ref 6.5–8.1)

## 2020-04-16 LAB — APTT: aPTT: 34 seconds (ref 24–36)

## 2020-04-16 LAB — BLOOD GAS, VENOUS
Acid-base deficit: 5.6 mmol/L — ABNORMAL HIGH (ref 0.0–2.0)
Bicarbonate: 20.2 mmol/L (ref 20.0–28.0)
FIO2: 21
O2 Saturation: 59 %
Patient temperature: 98.6
pCO2, Ven: 43.6 mmHg — ABNORMAL LOW (ref 44.0–60.0)
pH, Ven: 7.287 (ref 7.250–7.430)
pO2, Ven: 34.5 mmHg (ref 32.0–45.0)

## 2020-04-16 LAB — MRSA PCR SCREENING: MRSA by PCR: NEGATIVE

## 2020-04-16 MED ORDER — SODIUM CHLORIDE 0.9 % IV BOLUS
500.0000 mL | Freq: Once | INTRAVENOUS | Status: AC
  Administered 2020-04-16: 500 mL via INTRAVENOUS

## 2020-04-16 MED ORDER — SODIUM CHLORIDE 0.9 % IV SOLN
2.0000 g | Freq: Once | INTRAVENOUS | Status: AC
  Administered 2020-04-16: 2 g via INTRAVENOUS
  Filled 2020-04-16: qty 2

## 2020-04-16 MED ORDER — SODIUM CHLORIDE 0.9 % IV SOLN
2.0000 g | Freq: Two times a day (BID) | INTRAVENOUS | Status: DC
  Administered 2020-04-16 – 2020-04-17 (×2): 2 g via INTRAVENOUS
  Filled 2020-04-16 (×3): qty 2

## 2020-04-16 MED ORDER — LACTATED RINGERS IV SOLN: INTRAVENOUS | Status: DC

## 2020-04-16 MED ORDER — SODIUM CHLORIDE 0.9 % IV SOLN: 1.0000 g | INTRAVENOUS | Status: DC

## 2020-04-16 MED ORDER — SODIUM CHLORIDE 0.9% FLUSH
3.0000 mL | Freq: Two times a day (BID) | INTRAVENOUS | Status: DC
  Administered 2020-04-16 – 2020-04-20 (×6): 3 mL via INTRAVENOUS

## 2020-04-16 MED ORDER — SODIUM CHLORIDE 0.9 % IV SOLN: INTRAVENOUS | Status: DC

## 2020-04-16 MED ORDER — LABETALOL HCL 5 MG/ML IV SOLN
10.0000 mg | INTRAVENOUS | Status: DC | PRN
  Administered 2020-04-16 – 2020-04-19 (×2): 10 mg via INTRAVENOUS
  Filled 2020-04-16 (×2): qty 4

## 2020-04-16 MED ORDER — METOPROLOL TARTRATE 5 MG/5ML IV SOLN
2.5000 mg | Freq: Two times a day (BID) | INTRAVENOUS | Status: DC
  Administered 2020-04-16 – 2020-04-17 (×3): 2.5 mg via INTRAVENOUS
  Filled 2020-04-16 (×3): qty 5

## 2020-04-16 MED ORDER — SODIUM CHLORIDE 0.9 % IV SOLN: INTRAVENOUS | Status: AC

## 2020-04-16 MED ORDER — SODIUM CHLORIDE 0.9 % IV SOLN
500.0000 mg | INTRAVENOUS | Status: DC
  Administered 2020-04-16 – 2020-04-20 (×5): 500 mg via INTRAVENOUS
  Filled 2020-04-16 (×5): qty 500

## 2020-04-16 MED ORDER — HEPARIN SODIUM (PORCINE) 5000 UNIT/ML IJ SOLN
5000.0000 [IU] | Freq: Three times a day (TID) | INTRAMUSCULAR | Status: DC
  Administered 2020-04-16 – 2020-04-19 (×9): 5000 [IU] via SUBCUTANEOUS
  Filled 2020-04-16 (×9): qty 1

## 2020-04-16 NOTE — ED Triage Notes (Signed)
EMS called out due to temp 102.1 oral. Family administered tylenol PTA of ems. Patient vomited on EMS arrival. 4MG  IV Zofran admin by EMS> 89% on RA with ems, 3lpm [place] administered. Patient on prophylactic Abt for chronic UTI

## 2020-04-16 NOTE — Progress Notes (Signed)
Following for code sepsis 

## 2020-04-16 NOTE — Evaluation (Signed)
Clinical/Bedside Swallow Evaluation Patient Details  Name: Jenna Carrillo MRN: [id number] Date of Birth: 04-Jan-1931  Today's Date: 04/16/2020 Time: SLP Start Time (ACUTE ONLY): 1450 SLP Stop Time (ACUTE ONLY): 1510 SLP Time Calculation (min) (ACUTE ONLY): 20 min  Past Medical History:  Past Medical History:  Diagnosis Date  . Bronchiectasis   . COPD (chronic obstructive pulmonary disease) (HCC)   . Degenerative disk disease   . Eczema   . Gastric ulcer   . GERD (gastroesophageal reflux disease)   . Hiatal hernia   . Hyperlipidemia   . Hypertension   . Hypothyroidism   . Osteoarthritis   . Polymyalgia rheumatica (HCC)   . Pulmonary nodule, right   . Vertebrobasilar insufficiency    Past Surgical History:  Past Surgical History:  Procedure Laterality Date  . BREAST CYST EXCISION     right  . broken tail bone    . CAROTID ENDARTERECTOMY     right  . RHINOPLASTY    . TOTAL ABDOMINAL HYSTERECTOMY     HPI:  Patient is an 86 y.o. female with PMH: dementia, COPD, RA, polymyalgia rheumatica, paroxysmal atrial fibrillation on Eliquis, hypothyroidism, HLD, HFrEF, recurrent polymicrobial UTI's and per family is primarily non-verbal. She was admitted secondary to acting lethargic with generalized weakness at home (lives with daughter) followed by fevere of 102F at home, which prompted daughter to call EMS. She was afebrile, hyptertensive and on room air in ED and CT head did not reveal any acute abnormality; Covid 19 negative, UA with hazy urine but otherwise unremarkable.   Assessment / Plan / Recommendation Clinical Impression  Patient presents with a primarily cognitive-based oral dysphagia. She accepted small cup sips of water (thin liquid) but then swished around in mouth and would not swallow despite verbal, visual, tactile cues. She held one sip in mouth for duration of at least 90 seconds. When SLP put cup up to her lips and told her she could spit, she then spit the liquids  into cup. She did not accept a small bite of gelatin from spoon. At this time, patient is not ready for PO's. SLP Visit Diagnosis: Dysphagia, unspecified (R13.10)    Aspiration Risk  Risk for inadequate nutrition/hydration    Diet Recommendation NPO        Other  Recommendations Oral Care Recommendations: Oral care BID   Follow up Recommendations Other (comment) (TBD)      Frequency and Duration min 2x/week  1 week       Prognosis Prognosis for Safe Diet Advancement: Fair Barriers to Reach Goals: Cognitive deficits      Swallow Study   General Date of Onset: 04/16/20 HPI: Patient is an 86 y.o. female with PMH: dementia, COPD, RA, polymyalgia rheumatica, paroxysmal atrial fibrillation on Eliquis, hypothyroidism, HLD, HFrEF, recurrent polymicrobial UTI's and per family is primarily non-verbal. She was admitted secondary to acting lethargic with generalized weakness at home (lives with daughter) followed by fevere of 102F at home, which prompted daughter to call EMS. She was afebrile, hyptertensive and on room air in ED and CT head did not reveal any acute abnormality; Covid 19 negative, UA with hazy urine but otherwise unremarkable. Type of Study: Bedside Swallow Evaluation Previous Swallow Assessment: none Diet Prior to this Study: NPO Temperature Spikes Noted: No Respiratory Status: Room air History of Recent Intubation: No Behavior/Cognition: Alert;Pleasant mood;Confused;Requires cueing;Doesn't follow directions Oral Cavity Assessment: Within Functional Limits Oral Care Completed by SLP: Yes Oral Cavity - Dentition: Edentulous;Other (Comment) (edentulous bottom, upper  dentures in place) Self-Feeding Abilities: Total assist Patient Positioning: Upright in bed Baseline Vocal Quality: Normal Volitional Cough: Cognitively unable to elicit Volitional Swallow: Unable to elicit    Oral/Motor/Sensory Function Overall Oral Motor/Sensory Function: Other (comment) (limited  assessment but appears WFL)   Ice Chips     Thin Liquid Thin Liquid: Impaired Oral Phase Impairments: Poor awareness of bolus Oral Phase Functional Implications: Other (comment) (patient held liquids in mouth, swished around but did not swallow, even after duration of 90 seconds)    Nectar Thick   NT  Honey Thick   NT  Puree Puree: Impaired Oral Phase Impairments: Poor awareness of bolus Other Comments: patient did not accept gelatin from spoon   Solid     Solid: Not tested      Angela Nevin, MA, CCC-SLP Speech Therapy

## 2020-04-16 NOTE — ED Notes (Signed)
Patient transported to CT °

## 2020-04-16 NOTE — Progress Notes (Signed)
Notified provider of need to draw repeat lactic acid. 

## 2020-04-16 NOTE — Progress Notes (Signed)
Pharmacy Antibiotic Note  Jenna Carrillo is a 86 y.o. female admitted on 04/16/2020 with CAP vs. UTI with history of Pseudomonas UTI.  Pharmacy has been consulted for cefepime dosing.  Plan: Cefepime 2 gr IV q12h    Monitor clinical course, renal function, cultures as available   Height: 5\' 4"  (162.6 cm) Weight: 51 kg (112 lb 7 oz) IBW/kg (Calculated) : 54.7  Temp (24hrs), Avg:98.6 F (37 C), Min:98.5 F (36.9 C), Max:98.8 F (37.1 C)  Recent Labs  Lab 04/16/20 0812 04/16/20 1109  WBC 14.3*  --   CREATININE 1.01*  --   LATICACIDVEN 1.1 5.9*    Estimated Creatinine Clearance: 31.6 mL/min (A) (by C-G formula based on SCr of 1.01 mg/dL (H)).    Allergies  Allergen Reactions  . Sulfonamide Derivatives Rash    Antimicrobials this admission: 11/29 azithromycin >>  11/29 cefepime >>   Dose adjustments this admission:   Microbiology results: 11/29 BCx:  11/29 resp panel:  11/29 UCx:     Thank you for allowing pharmacy to be a part of this patient's care.   12/29, PharmD, BCPS 04/16/2020 12:23 PM

## 2020-04-16 NOTE — Progress Notes (Signed)
Notified bedside nurse of need to draw repeat lactic acid. 

## 2020-04-16 NOTE — H&P (Signed)
History and Physical        Hospital Admission Note Date: 04/16/2020  Patient name: Jenna Carrillo Medical record number: [id number] Date of birth: [birth year] Age: 86 y.o. Gender: female  PCP: Creola Corn, MD   Chief Complaint    Chief Complaint  Patient presents with  . Fever      HPI:   History obtained from ED note and daughter over the phone as the patient is nonverbal at baseline  Thisi s an 86 year old female with a history of COPD, RA, polymyalgia rheumatica, paroxysmal atrial fibrillation on eliquis, hypothyroidism, hypertension, hyperlipidemia, HFrEF, recurrent polymicrobial UTIs on trimethoprim prophylaxis, dementia who presented to the ED with fever since this AM. Per daughter, the patient started acting lethargic with generalized weakness yesterday but no fevers at the time. Overnight she had a mild cough though she does occasionally have a cough due to her underlying COPD. This AM she had a fever of 102F, prompting call to EMS. Daughter denies any diarrhea. Patient lives with daughter and is typically relatively independent at home but daughter helps with some tasks. Patient is essentially nonverbal but does say 'yes/no' however today she was not speaking. Her symptoms are typical of prior UTIs in the past. Daughter says that her UA's are typically negative but urine cultures are positive.    ED Course: Afebrile, hypertensive, on room air. Notable Labs: Na 136, K 3.9, Glucose 117, BUN 15, Cr 1.01, Lactic acid 1.1, WBC 14.3, UA with Hazy urine but otherwise unremarkable, COVID 19 pending. Notable Imaging: CXR-right basilar atelectasis or scarring. CT head without acute abnormality. Patient received Cefepime, 500 mL NS bolus and LR maintenance.   Vitals:   04/16/20 1145 04/16/20 1200  BP: (!) 175/77 (!) 178/82  Pulse: 90 85  Resp: 20 13  Temp: 98.5 F (36.9 C) 98.6  F (37 C)  SpO2: 98% 99%     Review of Systems:  Review of Systems  Unable to perform ROS: Patient nonverbal    Medical/Social/Family History   Past Medical History: Past Medical History:  Diagnosis Date  . Bronchiectasis   . COPD (chronic obstructive pulmonary disease) (HCC)   . Degenerative disk disease   . Eczema   . Gastric ulcer   . GERD (gastroesophageal reflux disease)   . Hiatal hernia   . Hyperlipidemia   . Hypertension   . Hypothyroidism   . Osteoarthritis   . Polymyalgia rheumatica (HCC)   . Pulmonary nodule, right   . Vertebrobasilar insufficiency     Past Surgical History:  Procedure Laterality Date  . BREAST CYST EXCISION     right  . broken tail bone    . CAROTID ENDARTERECTOMY     right  . RHINOPLASTY    . TOTAL ABDOMINAL HYSTERECTOMY      Medications: Prior to Admission medications   Medication Sig Start Date End Date Taking? Authorizing Provider  acetaminophen (TYLENOL) 325 MG tablet Take 650 mg by mouth every 6 (six) hours as needed for mild pain, fever or headache.   Yes [provider]  albuterol (VENTOLIN HFA) 108 (90 Base) MCG/ACT inhaler Inhale 1-2 puffs into the lungs every 6 (six) hours as needed for shortness of  breath or wheezing. 01/09/20  Yes [provider]  amitriptyline (ELAVIL) 50 MG tablet Take 50 mg by mouth at bedtime.   Yes [provider]  carvedilol (COREG) 12.5 MG tablet Take 1 tablet (12.5 mg total) by mouth 2 (two) times daily with a meal. 04/07/19  Yes O'Neal, Ronnald Ramp, MD  cholecalciferol (VITAMIN D) 25 MCG tablet Take 1 tablet (1,000 Units total) by mouth daily. 09/25/19  Yes Gonfa, Boyce Medici, MD  ELIQUIS 2.5 MG TABS tablet TAKE 1 TABLET BY MOUTH TWICE A DAY Patient taking differently: Take 2.5 mg by mouth 2 (two) times daily.  04/02/20  Yes O'Neal, Ronnald Ramp, MD  levothyroxine (SYNTHROID) 75 MCG tablet Take 1 tablet (75 mcg total) by mouth daily at 6 (six) AM. 01/31/19  Yes Rolly Salter, MD  memantine (NAMENDA) 5 MG tablet Take 10 mg by mouth daily.  04/10/20  Yes [provider]  omeprazole (PRILOSEC) 20 MG capsule Take 20 mg by mouth daily.    Yes [provider]  predniSONE (DELTASONE) 1 MG tablet Take 3 mg by mouth daily.    Yes [provider]  trimethoprim (TRIMPEX) 100 MG tablet Take 100 mg by mouth daily. 04/01/20  Yes [provider]  Monte Fantasia INHUB 100-50 MCG/DOSE AEPB Inhale 2 puffs into the lungs daily.  04/01/20  Yes [provider]  Spacer/Aero-Holding Chambers (AEROCHAMBER MV) inhaler Use as instructed 08/01/19   Parrett, Virgel Bouquet, NP    Allergies:   Allergies  Allergen Reactions  . Sulfonamide Derivatives Rash    Social History:  reports that she quit smoking about 61 years ago. Her smoking use included cigarettes. She has a 4.00 pack-year smoking history. She has never used smokeless tobacco. She reports that she does not drink alcohol and does not use drugs.  Family History: Family History  Problem Relation Age of Onset  . COPD Mother   . Breast cancer Mother   . Emphysema Mother   . Liver disease Son   . Emphysema Father   . Heart attack Father      Objective   Physical Exam: Blood pressure (!) 178/82, pulse 85, temperature 98.6 F (37 C), resp. rate 13, height 5\' 4"  (1.626 m), weight 51 kg, SpO2 99 %.  Physical Exam Vitals and nursing note reviewed.  Constitutional:      Comments: Does not follow commands  HENT:     Head: Normocephalic.  Eyes:     Conjunctiva/sclera: Conjunctivae normal.  Cardiovascular:     Rate and Rhythm: Normal rate and regular rhythm.  Pulmonary:     Comments: Patient became hypoxic to 70s while laying flat and had a dry cough during encounter. I sat her up and her hypoxia resolved Abdominal:     General: Abdomen is flat.     Tenderness: There is no abdominal tenderness.  Musculoskeletal:        General: No swelling or tenderness.  Neurological:     Mental  Status: She is alert. She is disoriented.     Comments: Unclear baseline     LABS on Admission: I have personally reviewed all the labs and imaging below    Basic Metabolic Panel: Recent Labs  Lab 04/16/20 0812  NA 136  K 3.9  CL 99  CO2 26  GLUCOSE 117*  BUN 15  CREATININE 1.01*  CALCIUM 9.0   Liver Function Tests: Recent Labs  Lab 04/16/20 0812  AST 34  ALT 24  ALKPHOS 112  BILITOT 0.5  PROT 7.8  ALBUMIN 4.0   No results for input(s): LIPASE, AMYLASE in the last 168 hours. No results for input(s): AMMONIA in the last 168 hours. CBC: Recent Labs  Lab 04/16/20 0812  WBC 14.3*  NEUTROABS 11.7*  HGB 13.9  HCT 43.8  MCV 98.2  PLT 305   Cardiac Enzymes: No results for input(s): CKTOTAL, CKMB, CKMBINDEX, TROPONINI in the last 168 hours. BNP: Invalid input(s): POCBNP CBG: No results for input(s): GLUCAP in the last 168 hours.  Radiological Exams on Admission:  CT Head Wo Contrast  Result Date: 04/16/2020 CLINICAL DATA:  Altered mental status. EXAM: CT HEAD WITHOUT CONTRAST TECHNIQUE: Contiguous axial images were obtained from the base of the skull through the vertex without intravenous contrast. COMPARISON:  Sep 21, 2019. FINDINGS: Brain: Mild diffuse cortical atrophy is noted. Mild chronic ischemic white matter disease is noted. No mass effect or midline shift is noted. Ventricular size is within normal limits. There is no evidence of mass lesion, hemorrhage or acute infarction. Vascular: No hyperdense vessel or unexpected calcification. Skull: Normal. Negative for fracture or focal lesion. Sinuses/Orbits: No acute finding. Other: None. IMPRESSION: Mild diffuse cortical atrophy. Mild chronic ischemic white matter disease. No acute intracranial abnormality seen. Electronically Signed   By: Lupita Raider M.D.   On: 04/16/2020 10:23   DG Chest Port 1 View  Result Date: 04/16/2020 CLINICAL DATA:  Sepsis. EXAM: PORTABLE CHEST 1 VIEW COMPARISON:  November 25, 2019.  FINDINGS: The heart size and mediastinal contours are within normal limits. No pneumothorax or pleural effusion is noted. Left lung is clear. Minimal right basilar subsegmental atelectasis or scarring is noted. The visualized skeletal structures are unremarkable. IMPRESSION: Minimal right basilar subsegmental atelectasis or scarring. Aortic Atherosclerosis (ICD10-I70.0). Electronically Signed   By: Lupita Raider M.D.   On: 04/16/2020 08:48      EKG: Independently reviewed   A & P   Principal Problem:   Acute metabolic encephalopathy Active Problems:   Essential hypertension   Polymyalgia rheumatica (HCC)   Current chronic use of systemic steroids   AF (paroxysmal atrial fibrillation) (HCC)   AMS (altered mental status)   1. Acute metabolic encephalopathy in the setting of dementia likely secondary to underlying infection a. At baseline she is nonverbal but more energetic b. UA negative but history of negative UA's with recurrent polymicrobial multidrug-resistant UTIs with similar symptoms in the past c. CXR with atelectasis d. CT head unremarkable e. N.p.o. pending SLP eval f. cefepime and azithromycin to cover CAP and Pseudomonas UTI pending further work-up  2. SIRS likely secondary to CAP/aspiration pneumonia vs.  UTI  a. T102 F at home, afebrile here but with leukocytosis, cough, brief hypoxia and encephalopathy as above b. Lactic acid 1.1->5.9, but could be elevated from lactated ringer's -> Trend c. Hold home trimethoprim d. Follow-up blood cultures e. Follow-up urine culture f. Follow-up COVID-19 swab g. Keep head of bed elevated h. cefepime and azithromycin   3. Episode of hypoxia likely from atelectasis +/- CAP a. Hypoxia resolved at bedside b. She likely wont cooperate with incentive spirometer c. Keep head of bed upright  4. Paroxysmal Atrial Fibrillation a. On eliquis and carvedilol at home b. Currently NPO and on IV lopressor  5. Hypertension   a. Lopressor as above  6. Hypothyroid a. Restart synthroid when tolerating PO  7. Rheumatoid arthritis/polymyalgia rheumatica a. On chronic low dose prednisone   8. Bronchiectasis, not in acute exacerbation   DVT prophylaxis:  heparin   Code Status: DNR  Diet: NPO Family Communication: Admission, patients condition and plan of care including tests being ordered have been discussed with the patient who indicates understanding and agrees with the plan and Code Status. Patient's daughter was updated  Disposition Plan: The appropriate patient status for this patient is INPATIENT. Inpatient status is judged to be reasonable and necessary in order to provide the required intensity of service to ensure the patient's safety. The patient's presenting symptoms, physical exam findings, and initial radiographic and laboratory data in the context of their chronic comorbidities is felt to place them at high risk for further clinical deterioration. Furthermore, it is not anticipated that the patient will be medically stable for discharge from the hospital within 2 midnights of admission. The following factors support the patient status of inpatient.   " The patient's presenting symptoms include encephalopathy, fever. " The worrisome physical exam findings include nonverbal, cough, brief hypoxia. " The initial radiographic and laboratory data are worrisome because of leukocytosis. " The chronic co-morbidities include recurrent UTIs, COPD.   * I certify that at the point of admission it is my clinical judgment that the patient will require inpatient hospital care spanning beyond 2 midnights from the point of admission due to high intensity of service, high risk for further deterioration and high frequency of surveillance required.*   Consultants  . none  Procedures  . none  Time Spent on Admission: 60 minutes    Jae Dire, DO [facility]  04/16/2020, 12:32 PM

## 2020-04-16 NOTE — Progress Notes (Signed)
Notified bedside nurse of need to draw repeat lactic acid @ 1315.

## 2020-04-16 NOTE — ED Notes (Signed)
Date and time results received: 04/16/20 1150 (use smartphrase ".now" to insert current time)  Test: latic acid Critical Value: 5.9  Name of Provider Notified: Dairl Ponder  Orders Received? Or Actions Taken?:

## 2020-04-16 NOTE — Progress Notes (Signed)
Notified bedside nurse of need to administer fluid bolus, pt needs 1530 cc.

## 2020-04-16 NOTE — ED Notes (Signed)
Daughter donna powers @ [phone]

## 2020-04-16 NOTE — ED Provider Notes (Signed)
[facility]-EMERGENCY DEPT Provider Note   CSN: [id number] Arrival date & time: 04/16/20  0751     History Chief Complaint  Patient presents with  . Fever    Jenna Carrillo is a 86 y.o. female.  HPI     Level 5 caveat for altered mental status.  Per daughter: Pt started acting "lethargic" y'day. No fevers or hypoxia, but she was just weak. She had mild cough off and on at night, but this morning when they checked her temp again it was 102.1, and she called paramedics.  Patient has history of recurrent UTI and the daughter reports that in the past she has had similar blank stare and unresponsiveness when she gets it.  Patient was given Tylenol prior to EMS getting there.  Patient did vomit, so she is unsure if Tylenol stayed.  Patient is not responding.  She has a blank stare, but does open her eye to verbal stimuli.  Past Medical History:  Diagnosis Date  . Bronchiectasis   . COPD (chronic obstructive pulmonary disease) (HCC)   . Degenerative disk disease   . Eczema   . Gastric ulcer   . GERD (gastroesophageal reflux disease)   . Hiatal hernia   . Hyperlipidemia   . Hypertension   . Hypothyroidism   . Osteoarthritis   . Polymyalgia rheumatica (HCC)   . Pulmonary nodule, right   . Vertebrobasilar insufficiency     Patient Active Problem List   Diagnosis Date Noted  . Acute respiratory failure with hypoxia and hypercapnia (HCC) 10/23/2019  . Sepsis secondary to UTI (HCC) 09/21/2019  . Proteus mirabilis infection 09/18/2019  . Acute lower UTI 09/18/2019  . Metabolic encephalopathy 09/02/2019  . Acute metabolic encephalopathy 09/01/2019  . Leukocytosis 09/01/2019  . Acute systolic heart failure (HCC) 01/28/2019  . AF (paroxysmal atrial fibrillation) (HCC)   . Current chronic use of systemic steroids 01/25/2019  . Encephalopathy 01/25/2019  . Fall from slip, trip, or stumble, initial encounter   . Generalized weakness   . Sepsis due to  pneumonia (HCC) 01/24/2019  . Bronchiectasis with (acute) exacerbation (HCC) 12/20/2018  . Lobar pneumonia, unspecified organism (HCC) 02/15/2018  . Nuclear sclerosis of both eyes 03/05/2016  . Glaucoma suspect, bilateral 03/05/2016  . CAP (community acquired pneumonia) 11/12/2015  . Pulmonary nodules 10/12/2015  . Cough 02/03/2014  . Macular degeneration, left eye 12/28/2013  . Glaucoma suspect, both eyes 12/28/2013  . Allergic rhinitis 11/12/2012  . Nuclear cataract 08/08/2011  . BRONCHIECTASIS 03/09/2009  . COPD (chronic obstructive pulmonary disease) (HCC) 03/09/2009  . Essential hypertension 01/31/2009  . Polymyalgia rheumatica (HCC) 01/31/2009    Past Surgical History:  Procedure Laterality Date  . BREAST CYST EXCISION     right  . broken tail bone    . CAROTID ENDARTERECTOMY     right  . RHINOPLASTY    . TOTAL ABDOMINAL HYSTERECTOMY       OB History   No obstetric history on file.     Family History  Problem Relation Age of Onset  . COPD Mother   . Breast cancer Mother   . Emphysema Mother   . Liver disease Son   . Emphysema Father   . Heart attack Father     Social History   Tobacco Use  . Smoking status: Former Smoker    Packs/day: 2.00    Years: 2.00    Pack years: 4.00    Types: Cigarettes    Quit date: 05/19/1958  Years since quitting: 61.9  . Smokeless tobacco: Never Used  Vaping Use  . Vaping Use: Never used  Substance Use Topics  . Alcohol use: No  . Drug use: Never    Home Medications Prior to Admission medications   Medication Sig Start Date End Date Taking? Authorizing Provider  acetaminophen (TYLENOL) 325 MG tablet Take 650 mg by mouth every 6 (six) hours as needed for mild pain, fever or headache.   Yes [provider]  albuterol (VENTOLIN HFA) 108 (90 Base) MCG/ACT inhaler Inhale 1-2 puffs into the lungs every 6 (six) hours as needed for shortness of breath or wheezing. 01/09/20  Yes [provider]   amitriptyline (ELAVIL) 50 MG tablet Take 50 mg by mouth at bedtime.   Yes [provider]  carvedilol (COREG) 12.5 MG tablet Take 1 tablet (12.5 mg total) by mouth 2 (two) times daily with a meal. 04/07/19  Yes O'Neal, Ronnald Ramp, MD  cholecalciferol (VITAMIN D) 25 MCG tablet Take 1 tablet (1,000 Units total) by mouth daily. 09/25/19  Yes Gonfa, Boyce Medici, MD  ELIQUIS 2.5 MG TABS tablet TAKE 1 TABLET BY MOUTH TWICE A DAY Patient taking differently: Take 2.5 mg by mouth 2 (two) times daily.  04/02/20  Yes O'Neal, Ronnald Ramp, MD  levothyroxine (SYNTHROID) 75 MCG tablet Take 1 tablet (75 mcg total) by mouth daily at 6 (six) AM. 01/31/19  Yes Rolly Salter, MD  memantine (NAMENDA) 5 MG tablet Take 10 mg by mouth daily.  04/10/20  Yes [provider]  omeprazole (PRILOSEC) 20 MG capsule Take 20 mg by mouth daily.    Yes [provider]  predniSONE (DELTASONE) 1 MG tablet Take 3 mg by mouth daily.    Yes [provider]  trimethoprim (TRIMPEX) 100 MG tablet Take 100 mg by mouth daily. 04/01/20  Yes [provider]  Monte Fantasia INHUB 100-50 MCG/DOSE AEPB Inhale 2 puffs into the lungs daily.  04/01/20  Yes [provider]  Spacer/Aero-Holding Chambers (AEROCHAMBER MV) inhaler Use as instructed 08/01/19   Parrett, Virgel Bouquet, NP    Allergies    Sulfonamide derivatives  Review of Systems   Review of Systems  Unable to perform ROS: Mental status change    Physical Exam Updated Vital Signs BP (!) 175/78   Pulse 88   Temp 98.8 F (37.1 C) (Rectal)   Resp (!) 22   Ht 5\' 4"  (1.626 m)   Wt 51 kg   SpO2 96%   BMI 19.30 kg/m   Physical Exam Vitals and nursing note reviewed.  Constitutional:      Appearance: She is well-developed. She is not toxic-appearing.     Comments: somnolent  HENT:     Head: Atraumatic.  Eyes:     Extraocular Movements: Extraocular movements intact.  Cardiovascular:     Rate and Rhythm: Normal rate.  Pulmonary:      Effort: Pulmonary effort is normal.  Abdominal:     General: Bowel sounds are normal.  Musculoskeletal:        General: No tenderness or deformity.     Cervical back: Neck supple.  Skin:    General: Skin is warm and dry.  Neurological:     Mental Status: She is disoriented.     ED Results / Procedures / Treatments   Labs (all labs ordered are listed, but only abnormal results are displayed) Labs Reviewed  COMPREHENSIVE METABOLIC PANEL - Abnormal; Notable for the following components:  Result Value   Glucose, Bld 117 (*)    Creatinine, Ser 1.01 (*)    GFR, Estimated 54 (*)    All other components within normal limits  CBC WITH DIFFERENTIAL/PLATELET - Abnormal; Notable for the following components:   WBC 14.3 (*)    Neutro Abs 11.7 (*)    Monocytes Absolute 1.3 (*)    Abs Immature Granulocytes 0.08 (*)    All other components within normal limits  URINALYSIS, ROUTINE W REFLEX MICROSCOPIC - Abnormal; Notable for the following components:   APPearance HAZY (*)    All other components within normal limits  URINE CULTURE  CULTURE, BLOOD (ROUTINE X 2)  CULTURE, BLOOD (ROUTINE X 2)  RESP PANEL BY RT-PCR (FLU A&B, COVID) ARPGX2  LACTIC ACID, PLASMA  PROTIME-INR  APTT  LACTIC ACID, PLASMA    EKG EKG Interpretation  Date/Time:  Monday April 16 2020 08:25:51 EST Ventricular Rate:  89 PR Interval:    QRS Duration: 87 QT Interval:  374 QTC Calculation: 456 R Axis:   51 Text Interpretation: Sinus rhythm No acute changes No significant change since last tracing Confirmed by Derwood Kaplan ([id number]) on 04/16/2020 8:58:22 AM   Radiology CT Head Wo Contrast  Result Date: 04/16/2020 CLINICAL DATA:  Altered mental status. EXAM: CT HEAD WITHOUT CONTRAST TECHNIQUE: Contiguous axial images were obtained from the base of the skull through the vertex without intravenous contrast. COMPARISON:  Sep 21, 2019. FINDINGS: Brain: Mild diffuse cortical atrophy is noted. Mild chronic  ischemic white matter disease is noted. No mass effect or midline shift is noted. Ventricular size is within normal limits. There is no evidence of mass lesion, hemorrhage or acute infarction. Vascular: No hyperdense vessel or unexpected calcification. Skull: Normal. Negative for fracture or focal lesion. Sinuses/Orbits: No acute finding. Other: None. IMPRESSION: Mild diffuse cortical atrophy. Mild chronic ischemic white matter disease. No acute intracranial abnormality seen. Electronically Signed   By: Lupita Raider M.D.   On: 04/16/2020 10:23   DG Chest Port 1 View  Result Date: 04/16/2020 CLINICAL DATA:  Sepsis. EXAM: PORTABLE CHEST 1 VIEW COMPARISON:  November 25, 2019. FINDINGS: The heart size and mediastinal contours are within normal limits. No pneumothorax or pleural effusion is noted. Left lung is clear. Minimal right basilar subsegmental atelectasis or scarring is noted. The visualized skeletal structures are unremarkable. IMPRESSION: Minimal right basilar subsegmental atelectasis or scarring. Aortic Atherosclerosis (ICD10-I70.0). Electronically Signed   By: Lupita Raider M.D.   On: 04/16/2020 08:48    Procedures Procedures (including critical care time)  Medications Ordered in ED Medications  lactated ringers infusion ( Intravenous New Bag/Given 04/16/20 0835)  sodium chloride 0.9 % bolus 500 mL (0 mLs Intravenous Stopped 04/16/20 0918)  ceFEPIme (MAXIPIME) 2 g in sodium chloride 0.9 % 100 mL IVPB (0 g Intravenous Stopped 04/16/20 1113)    ED Course  I have reviewed the triage vital signs and the nursing notes.  Pertinent labs & imaging results that were available during my care of the patient were reviewed by me and considered in my medical decision making (see chart for details).    MDM Rules/Calculators/A&P                          86 year old female comes in a chief complaint of altered mental status  DDx includes: ICH / Stroke Sepsis syndrome Infection -  UTI/Pneumonia Encephalopathy  Drug overdose /toxic encephalopathy Metabolic disorders including thyroid disorders, adrenal insufficiency  Hypercapnia / COPD Hypoxia  Patient comes in a chief complaint of altered mental status.  She is unresponsive to stimuli.  Her eyes are open and she has a gag reflex.  No history of seizures.  Wife reports that she has had similar episodes in the past with UTI.  Initial suspicion is for UTI, especially in the setting of her having fever at home.  No fever here but patient was given Tylenol.  We will get basic labs and urine analysis.  Reassess: Urine analysis did not reveal any acute abnormality therefore we will get a CT scan of the head.  We will proceed with admission. Venous blood gas will also be added  Final Clinical Impression(s) / ED Diagnoses Final diagnoses:  Acute encephalopathy    Rx / DC Orders ED Discharge Orders    None       Derwood Kaplan, MD 04/16/20 1133

## 2020-04-17 DIAGNOSIS — Z7952 Long term (current) use of systemic steroids: Secondary | ICD-10-CM | POA: Diagnosis not present

## 2020-04-17 DIAGNOSIS — G9341 Metabolic encephalopathy: Secondary | ICD-10-CM | POA: Diagnosis not present

## 2020-04-17 DIAGNOSIS — E039 Hypothyroidism, unspecified: Secondary | ICD-10-CM

## 2020-04-17 DIAGNOSIS — I10 Essential (primary) hypertension: Secondary | ICD-10-CM | POA: Diagnosis not present

## 2020-04-17 DIAGNOSIS — I480 Paroxysmal atrial fibrillation: Secondary | ICD-10-CM | POA: Diagnosis not present

## 2020-04-17 LAB — GLUCOSE, CAPILLARY
Glucose-Capillary: 43 mg/dL — CL (ref 70–99)
Glucose-Capillary: 95 mg/dL (ref 70–99)
Glucose-Capillary: 104 mg/dL — ABNORMAL HIGH (ref 70–99)

## 2020-04-17 LAB — CBC
HCT: 44.1 % (ref 36.0–46.0)
Hemoglobin: 13.8 g/dL (ref 12.0–15.0)
MCH: 31.2 pg (ref 26.0–34.0)
MCHC: 31.3 g/dL (ref 30.0–36.0)
MCV: 99.8 fL (ref 80.0–100.0)
Platelets: 228 10*3/uL (ref 150–400)
RBC: 4.42 MIL/uL (ref 3.87–5.11)
RDW: 13 % (ref 11.5–15.5)
WBC: 14 10*3/uL — ABNORMAL HIGH (ref 4.0–10.5)
nRBC: 0 % (ref 0.0–0.2)

## 2020-04-17 LAB — BASIC METABOLIC PANEL
Anion gap: 9 (ref 5–15)
BUN: 11 mg/dL (ref 8–23)
CO2: 25 mmol/L (ref 22–32)
Calcium: 8.7 mg/dL — ABNORMAL LOW (ref 8.9–10.3)
Chloride: 102 mmol/L (ref 98–111)
Creatinine, Ser: 0.81 mg/dL (ref 0.44–1.00)
GFR, Estimated: >60 mL/min (ref >60)
Glucose, Bld: 81 mg/dL (ref 70–99)
Potassium: 3.8 mmol/L (ref 3.5–5.1)
Sodium: 136 mmol/L (ref 135–145)

## 2020-04-17 LAB — AMMONIA: Ammonia: 20 umol/L (ref 9–35)

## 2020-04-17 LAB — URINE CULTURE: Culture: NO GROWTH

## 2020-04-17 MED ORDER — SODIUM CHLORIDE 0.9 % IV SOLN
2.0000 g | INTRAVENOUS | Status: DC
  Administered 2020-04-17 – 2020-04-19 (×3): 2 g via INTRAVENOUS
  Filled 2020-04-17: qty 2
  Filled 2020-04-17 (×3): qty 20

## 2020-04-17 MED ORDER — DEXTROSE-NACL 5-0.9 % IV SOLN: INTRAVENOUS | Status: DC

## 2020-04-17 MED ORDER — METOPROLOL TARTRATE 5 MG/5ML IV SOLN
2.5000 mg | Freq: Three times a day (TID) | INTRAVENOUS | Status: DC
  Administered 2020-04-17 – 2020-04-19 (×6): 2.5 mg via INTRAVENOUS
  Filled 2020-04-17 (×6): qty 5

## 2020-04-17 MED ORDER — LIP MEDEX EX OINT
TOPICAL_OINTMENT | CUTANEOUS | Status: AC
  Filled 2020-04-17: qty 7

## 2020-04-17 MED ORDER — LIP MEDEX EX OINT: TOPICAL_OINTMENT | CUTANEOUS | Status: DC | PRN

## 2020-04-17 MED ORDER — DEXTROSE 50 % IV SOLN
1.0000 | Freq: Once | INTRAVENOUS | Status: AC
  Administered 2020-04-17: 50 mL via INTRAVENOUS
  Filled 2020-04-17: qty 50

## 2020-04-17 NOTE — Progress Notes (Addendum)
PROGRESS NOTE    Jenna Carrillo  OHY:[id number] DOB: Dec 26, 1930 DOA: 04/16/2020 PCP: Shon Baton, MD   Chief Complaint  Patient presents with  . Fever    Brief Narrative:  HPI per Dr. Neysa Bonito History obtained from ED note and daughter over the phone as the patient is nonverbal at baseline  Thisi s an 86 year old female with a history of COPD, RA, polymyalgia rheumatica, paroxysmal atrial fibrillation on eliquis, hypothyroidism, hypertension, hyperlipidemia, HFrEF, recurrent polymicrobial UTIs on trimethoprim prophylaxis, dementia who presented to the ED with fever since this AM. Per daughter, the patient started acting lethargic with generalized weakness yesterday but no fevers at the time. Overnight she had a mild cough though she does occasionally have a cough due to her underlying COPD. This AM she had a fever of 102F, prompting call to EMS. Daughter denies any diarrhea. Patient lives with daughter and is typically relatively independent at home but daughter helps with some tasks. Patient is essentially nonverbal but does say 'yes/no' however today she was not speaking. Her symptoms are typical of prior UTIs in the past. Daughter says that her UA's are typically negative but urine cultures are positive.    ED Course: Afebrile, hypertensive, on room air. Notable Labs: Na 136, K 3.9, Glucose 117, BUN 15, Cr 1.01, Lactic acid 1.1, WBC 14.3, UA with Hazy urine but otherwise unremarkable, COVID 19 pending. Notable Imaging: CXR-right basilar atelectasis or scarring. CT head without acute abnormality. Patient received Cefepime, 500 mL NS bolus and LR maintenance.  Assessment & Plan:   Principal Problem:   Acute metabolic encephalopathy Active Problems:   Essential hypertension   Polymyalgia rheumatica (HCC)   Current chronic use of systemic steroids   AF (paroxysmal atrial fibrillation) (HCC)   AMS (altered mental status)   Hypothyroidism  1 acute metabolic encephalopathy in the  setting of dementia Concern for infectious etiology.  Patient noted at baseline to be nonverbal but more energetic.  Patient noted to use simple answers of yes or no.  Urinalysis on admission noted to be negative however per family urine cultures in the past have come back positive.  Chest x-ray noted with some atelectasis.  Patient noted to have some respiratory symptoms on admission and concern for possible community-acquired pneumonia.  With eyes open today and answering yes to all questions.  Urine cultures negative with no growth.  Check an ammonia level.  Will narrow IV antibiotics from cefepime to Rocephin.  Continue azithromycin.  Hydration.  Follow.  2.  SIRS likely secondary to CAP/aspiration pneumonia Patient met criteria for SIRS on admission with fever of 102 at home, leukocytosis, cough, brief hypoxia, metabolic encephalopathy.  Lactic acid level on admission was 5.9 however improved to 1.9.  Patient noted to be on prophylactic trimethoprim that is on hold.  Patient pancultured.  Urine cultures with no growth.  COVID-19 PCR negative.  Continue azithromycin.  Narrow IV cefepime to IV Rocephin.  Supportive care.  Follow.  3.  Episode of hypoxia likely from atelectasis versus CAP Hypoxia noted to have resolved.  Due to dementia likely unable to cooperate with flutter valve and incentive spirometry.  Follow.  4.  Paroxysmal atrial fibrillation IV Lopressor for rate control.  Resume Eliquis for anticoagulation tomorrow.  5.  Hypertension Blood pressure elevated.  Increase IV Lopressor to 2.5 mg every 8 hours.  Once tolerating oral intake could likely transition back to home regimen oral Coreg.  6.  Hypothyroidism When tolerating oral intake could resume Synthroid.  7.  Rheumatoid arthritis/PMR On chronic low-dose steroids.  8.  Bronchiectasis Stable.  On IV antibiotics.  9. Hypoglycemia CBG of 43 this morning. Patient with poor oral intake over past 1-2 days.  D50 1 amp IV x1.   Place on D5NS at 100 cc/h.  Check CBG every 4 hours for the next 24 hours.   DVT prophylaxis: Heparin Code Status: DNR Family Communication: Updated daughter on the telephone. Disposition:   Status is: Inpatient    Dispo: The patient is from: Home              Anticipated d/c is to: Likely home              Anticipated d/c date is: 2 to 3 days.              Patient currently on IV antibiotics, with acute metabolic encephalopathy.  Not stable for discharge.       Consultants:   None  Procedures:   CT head 04/16/2020  Chest x-ray 04/16/2020  Antimicrobials:   IV cefepime 04/16/2020>>>>> 04/17/2020  IV Rocephin 04/17/2020  IV azithromycin 04/16/2020   Subjective: Patient alert.  Answers yes to every question.  Per speech therapy had more intake today.  Objective: Vitals:   04/16/20 1819 04/16/20 2107 04/17/20 0012 04/17/20 1420  BP: (!) 158/80 (!) 156/68 (!) 174/73 (!) 152/68  Pulse: 82 87 88 86  Resp:  _0 Temp:  98 F (36.7 C) 97.8 F (36.6 C) 98.3 F (36.8 C)  TempSrc:  Oral  Oral  SpO2:  96% 98% 98%  Weight:      Height:        Intake/Output Summary (Last 24 hours) at 04/17/2020 1810 Last data filed at 04/17/2020 0600 Gross per 24 hour  Intake 500 ml  Output 1050 ml  Net -550 ml   Filed Weights   04/16/20 0806  Weight: 51 kg    Examination:  General exam: Appears calm and comfortable  Respiratory system: Clear to auscultation anterior lung fields. Respiratory effort normal. Cardiovascular system: S1 & S2 heard, RRR. No JVD, murmurs, rubs, gallops or clicks. No pedal edema. Gastrointestinal system: Abdomen is nondistended, soft and nontender. No organomegaly or masses felt. Normal bowel sounds heard. Central nervous system: Alert and oriented. No focal neurological deficits. Extremities: Symmetric 5 x 5 power. Skin: No rashes, lesions or ulcers Psychiatry: Judgement and insight appear normal. Mood & affect appropriate.     Data  Reviewed: I have personally reviewed following labs and imaging studies  CBC: Recent Labs  Lab 04/16/20 0812 04/17/20 0424  WBC 14.3* 14.0*  NEUTROABS 11.7*  --   HGB 13.9 13.8  HCT 43.8 44.1  MCV 98.2 99.8  PLT 305 846    Basic Metabolic Panel: Recent Labs  Lab 04/16/20 0812 04/17/20 0424  NA 136 136  K 3.9 3.8  CL 99 102  CO2 26 25  GLUCOSE 117* 81  BUN 15 11  CREATININE 1.01* 0.81  CALCIUM 9.0 8.7*    GFR: Estimated Creatinine Clearance: 39.4 mL/min (by C-G formula based on SCr of 0.81 mg/dL).  Liver Function Tests: Recent Labs  Lab 04/16/20 0812  AST 34  ALT 24  ALKPHOS 112  BILITOT 0.5  PROT 7.8  ALBUMIN 4.0    CBG: Recent Labs  Lab 04/17/20 1140  GLUCAP 43*     Recent Results (from the past 240 hour(s))  Blood Culture (routine x 2)     Status: None (  Preliminary result)   Collection Time: 04/16/20  8:12 AM   Specimen: BLOOD  Result Value Ref Range Status   Specimen Description   Final    BLOOD BLOOD RIGHT FOREARM Performed at [facility] [address], [place], [place] [address]    Special Requests   Final    BOTTLES DRAWN AEROBIC AND ANAEROBIC Blood Culture adequate volume Performed at [facility] [address], [place], [place] [address]    Culture   Final    NO GROWTH < 24 HOURS Performed at [facility] [address], [place], [place] [address]    Report Status PENDING  Incomplete  Blood Culture (routine x 2)     Status: None (Preliminary result)   Collection Time: 04/16/20  8:20 AM   Specimen: BLOOD  Result Value Ref Range Status   Specimen Description   Final    BLOOD RIGHT ANTECUBITAL Performed at [facility] [address], [place], [place] [address]    Special Requests   Final    BOTTLES DRAWN AEROBIC AND ANAEROBIC Blood Culture results may not be optimal due to an inadequate volume of blood received in culture bottles Performed at [facility] [address], [place], [place] [address]    Culture   Final    NO GROWTH < 24 HOURS Performed at [facility] [address], [place], [place] [address]    Report Status PENDING  Incomplete  Urine culture     Status: None   Collection Time: 04/16/20  8:23 AM   Specimen: In/Out Cath Urine  Result Value Ref Range Status   Specimen Description   Final    IN/OUT CATH URINE Performed at [facility] [address], [place], [place] [address]    Special Requests   Final    NONE Performed at [facility], [place] [address] [place], [place] [address]    Culture   Final    NO GROWTH Performed at [facility], [place] [address] [place], [place] [address]    Report Status 04/17/2020 FINAL  Final  Resp Panel by RT-PCR (Flu A&B, Covid) Nasopharyngeal Swab     Status: None   Collection Time: 04/16/20 11:14 AM   Specimen: Nasopharyngeal Swab; Nasopharyngeal(NP) swabs in vial transport medium  Result Value Ref Range Status   SARS Coronavirus 2 by RT PCR NEGATIVE NEGATIVE Final    Comment: (NOTE) SARS-CoV-2 target nucleic acids are NOT DETECTED.  The SARS-CoV-2 RNA is generally detectable in upper respiratory specimens during the acute phase of infection. The lowest concentration of SARS-CoV-2 viral copies this assay can detect is 138 copies/mL. A negative result does not preclude SARS-Cov-2 infection and should not be used as the sole basis for treatment or other patient management decisions. A negative result may occur with  improper specimen collection/handling, submission of specimen other than nasopharyngeal swab, presence of viral mutation(s) within the areas targeted by this assay, and inadequate number of viral copies(<138 copies/mL). A negative result must be combined with clinical observations, patient history, and epidemiological information. The expected result is Negative.  Fact Sheet for Patients:   EntrepreneurPulse.com.au  Fact Sheet for Healthcare Providers:  IncredibleEmployment.be  This test is no t yet approved or cleared by the [place] FDA and  has been authorized for detection and/or diagnosis of SARS-CoV-2 by FDA under an Emergency Use Authorization (EUA). This EUA will remain  in  effect (meaning this test can be used) for the duration of the COVID-19 declaration under Section 564(b)(1) of the Act, 21 U.S.C.section 360bbb-3(b)(1), unless the authorization is terminated  or revoked sooner.       Influenza A by PCR NEGATIVE NEGATIVE Final   Influenza B by PCR NEGATIVE NEGATIVE Final    Comment: (NOTE) The Xpert Xpress SARS-CoV-2/FLU/RSV plus assay is intended as an aid in the diagnosis of influenza from Nasopharyngeal swab specimens and should not be used as a sole basis for treatment. Nasal washings and aspirates are unacceptable for Xpert Xpress SARS-CoV-2/FLU/RSV testing.  Fact Sheet for Patients: EntrepreneurPulse.com.au  Fact Sheet for Healthcare Providers: IncredibleEmployment.be  This test is not yet approved or cleared by the [place] FDA and has been authorized for detection and/or diagnosis of SARS-CoV-2 by FDA under an Emergency Use Authorization (EUA). This EUA will remain in effect (meaning this test can be used) for the duration of the COVID-19 declaration under Section 564(b)(1) of the Act, 21 U.S.C. section 360bbb-3(b)(1), unless the authorization is terminated or revoked.  Performed at [facility], [place] [address], [place], [place] [address]   MRSA PCR Screening     Status: None   Collection Time: 04/16/20 11:52 AM   Specimen: Nasal Mucosa; Nasopharyngeal  Result Value Ref Range Status   MRSA by PCR NEGATIVE NEGATIVE Final    Comment:        The GeneXpert MRSA Assay (FDA approved for NASAL specimens only), is one component of  a comprehensive MRSA colonization surveillance program. It is not intended to diagnose MRSA infection nor to guide or monitor treatment for MRSA infections. Performed at [facility], [place] [address], [place], [place] [address]          Radiology Studies: CT Head Wo Contrast  Result Date: 04/16/2020 CLINICAL DATA:  Altered mental status. EXAM: CT HEAD WITHOUT CONTRAST TECHNIQUE: Contiguous axial images were obtained from the base of the skull through the vertex without intravenous contrast. COMPARISON:  Sep 21, 2019. FINDINGS: Brain: Mild diffuse cortical atrophy is noted. Mild chronic ischemic white matter disease is noted. No mass effect or midline shift is noted. Ventricular size is within normal limits. There is no evidence of mass lesion, hemorrhage or acute infarction. Vascular: No hyperdense vessel or unexpected calcification. Skull: Normal. Negative for fracture or focal lesion. Sinuses/Orbits: No acute finding. Other: None. IMPRESSION: Mild diffuse cortical atrophy. Mild chronic ischemic white matter disease. No acute intracranial abnormality seen. Electronically Signed   By: Marijo Conception M.D.   On: 04/16/2020 10:23   DG Chest Port 1 View  Result Date: 04/16/2020 CLINICAL DATA:  Sepsis. EXAM: PORTABLE CHEST 1 VIEW COMPARISON:  November 25, 2019. FINDINGS: The heart size and mediastinal contours are within normal limits. No pneumothorax or pleural effusion is noted. Left lung is clear. Minimal right basilar subsegmental atelectasis or scarring is noted. The visualized skeletal structures are unremarkable. IMPRESSION: Minimal right basilar subsegmental atelectasis or scarring. Aortic Atherosclerosis (ICD10-I70.0). Electronically Signed   By: Marijo Conception M.D.   On: 04/16/2020 08:48        Scheduled Meds: . heparin  5,000 Units Subcutaneous Q8H  . metoprolol tartrate  2.5 mg Intravenous Q8H  . sodium chloride flush  3 mL Intravenous Q12H   Continuous  Infusions: . azithromycin 500 mg (04/17/20 1156)  . ceFEPime (MAXIPIME) IV 2 g (04/17/20 0908)  . dextrose 5 % and 0.9% NaCl 100 mL/hr at 04/17/20 1300  LOS: 1 day    Time spent: 35 minutes    Irine Seal, MD [facility]   To contact the attending provider between 7A-7P or the covering provider during after hours 7P-7A, please log into the web site [email or web address] and access using universal  password for that web site. If you do not have the password, please call the hospital operator.  04/17/2020, 6:10 PM

## 2020-04-17 NOTE — Progress Notes (Signed)
  Speech Language Pathology Treatment: Dysphagia  Patient Details Name: Jenna Carrillo MRN: [id number] DOB: 07/07/1930 Today's Date: 04/17/2020 Time: 1751-0258 SLP Time Calculation (min) (ACUTE ONLY): 25 min  Assessment / Plan / Recommendation Clinical Impression  Patient seen to address dysphagia goals with trials of thin liquids (water), puree solids (applesauce) and gelatin. Patient able to hold small (2 ounce size) paper cup and give self sips of water, however required mod-maximal cues to initiate as she is easily distracted. She exhibited prolonged mastication of gelatin and delayed oral transit of puree and gelatin boluses with suspected delayed swallow initiation with all consistencies. She did exhibit minimal frequency and intensity of delayed cough. SLP is recommending initiating full liquids (thin) diet with full supervision, assistance as needed for self-feeding, meds crushed in puree. SLP will continue to follow patient for toleration of diet and to determine if any further swallow assessments warranted.    HPI HPI: Patient is an 86 y.o. female with PMH: dementia, COPD, RA, polymyalgia rheumatica, paroxysmal atrial fibrillation on Eliquis, hypothyroidism, HLD, HFrEF, recurrent polymicrobial UTI's and per family is primarily non-verbal. She was admitted secondary to acting lethargic with generalized weakness at home (lives with daughter) followed by fevere of 102F at home, which prompted daughter to call EMS. She was afebrile, hyptertensive and on room air in ED and CT head did not reveal any acute abnormality; Covid 19 negative, UA with hazy urine but otherwise unremarkable.      SLP Plan  Continue with current plan of care       Recommendations  Diet recommendations: Thin liquid;Other(comment) (full liquids) Liquids provided via: Cup Medication Administration: Crushed with puree Supervision: Full supervision/cueing for compensatory strategies;Staff to assist with self  feeding Compensations: Minimize environmental distractions;Slow rate;Small sips/bites Postural Changes and/or Swallow Maneuvers: Seated upright 90 degrees                Oral Care Recommendations: Oral care BID Follow up Recommendations: 24 hour supervision/assistance;Skilled Nursing facility;Home health SLP SLP Visit Diagnosis: Dysphagia, unspecified (R13.10) Plan: Continue with current plan of care       GO              Angela Nevin, MA, CCC-SLP Speech Therapy

## 2020-04-17 NOTE — Plan of Care (Signed)

## 2020-04-17 NOTE — Plan of Care (Signed)

## 2020-04-18 DIAGNOSIS — G9341 Metabolic encephalopathy: Secondary | ICD-10-CM | POA: Diagnosis not present

## 2020-04-18 LAB — CBC
HCT: 40.1 % (ref 36.0–46.0)
Hemoglobin: 13.1 g/dL (ref 12.0–15.0)
MCH: 31 pg (ref 26.0–34.0)
MCHC: 32.7 g/dL (ref 30.0–36.0)
MCV: 95 fL (ref 80.0–100.0)
Platelets: 181 10*3/uL (ref 150–400)
RBC: 4.22 MIL/uL (ref 3.87–5.11)
RDW: 12.6 % (ref 11.5–15.5)
WBC: 12.6 10*3/uL — ABNORMAL HIGH (ref 4.0–10.5)
nRBC: 0 % (ref 0.0–0.2)

## 2020-04-18 LAB — GLUCOSE, CAPILLARY
Glucose-Capillary: 100 mg/dL — ABNORMAL HIGH (ref 70–99)
Glucose-Capillary: 55 mg/dL — ABNORMAL LOW (ref 70–99)
Glucose-Capillary: 83 mg/dL (ref 70–99)
Glucose-Capillary: 95 mg/dL (ref 70–99)
Glucose-Capillary: 96 mg/dL (ref 70–99)
Glucose-Capillary: 99 mg/dL (ref 70–99)

## 2020-04-18 LAB — BASIC METABOLIC PANEL
Anion gap: 8 (ref 5–15)
BUN: 8 mg/dL (ref 8–23)
CO2: 28 mmol/L (ref 22–32)
Calcium: 8.3 mg/dL — ABNORMAL LOW (ref 8.9–10.3)
Chloride: 100 mmol/L (ref 98–111)
Creatinine, Ser: 0.82 mg/dL (ref 0.44–1.00)
GFR, Estimated: >60 mL/min (ref >60)
Glucose, Bld: 111 mg/dL — ABNORMAL HIGH (ref 70–99)
Potassium: 3.5 mmol/L (ref 3.5–5.1)
Sodium: 136 mmol/L (ref 135–145)

## 2020-04-18 NOTE — Plan of Care (Signed)

## 2020-04-18 NOTE — Progress Notes (Signed)
PROGRESS NOTE    Jenna Carrillo  MAU:[id number] DOB: 03/06/1931 DOA: 04/16/2020 PCP: Shon Baton, MD   Chief Complaint  Patient presents with  . Fever    Brief Narrative:  HPI per Dr. Neysa Bonito History obtained from ED note and daughter over the phone as the patient is nonverbal at baseline  Thisi s an 86 year old female with a history of COPD, RA, polymyalgia rheumatica, paroxysmal atrial fibrillation on eliquis, hypothyroidism, hypertension, hyperlipidemia, HFrEF, recurrent polymicrobial UTIs on trimethoprim prophylaxis, dementia who presented to the ED with fever since this AM. Per daughter, the patient started acting lethargic with generalized weakness yesterday but no fevers at the time. Overnight she had a mild cough though she does occasionally have a cough due to her underlying COPD. This AM she had a fever of 102F, prompting call to EMS. Daughter denies any diarrhea. Patient lives with daughter and is typically relatively independent at home but daughter helps with some tasks. Patient is essentially nonverbal but does say 'yes/no' however today she was not speaking. Her symptoms are typical of prior UTIs in the past. Daughter says that her UA's are typically negative but urine cultures are positive.    ED Course: Afebrile, hypertensive, on room air. Notable Labs: Na 136, K 3.9, Glucose 117, BUN 15, Cr 1.01, Lactic acid 1.1, WBC 14.3, UA with Hazy urine but otherwise unremarkable, COVID 19 pending. Notable Imaging: CXR-right basilar atelectasis or scarring. CT head without acute abnormality. Patient received Cefepime, 500 mL NS bolus and LR maintenance.  Assessment & Plan:   Principal Problem:   Acute metabolic encephalopathy Active Problems:   Essential hypertension   Polymyalgia rheumatica (HCC)   Current chronic use of systemic steroids   AF (paroxysmal atrial fibrillation) (HCC)   AMS (altered mental status)   Hypothyroidism  1 acute metabolic encephalopathy in the  setting of dementia Concern for infectious etiology.  Patient noted at baseline to be nonverbal but more energetic.  Patient noted to use simple answers of yes or no.  Urinalysis on admission noted to be negative however per family urine cultures in the past have come back positive.  Chest x-ray noted with some atelectasis.  Patient noted to have some respiratory symptoms on admission and concern for possible community-acquired pneumonia. Patient is alert on sitting out in the chair and feeding herself.  Able to answer yes and no questions.  Patient appears at baseline.  2.  SIRS likely secondary to CAP/aspiration pneumonia Patient met criteria for SIRS on admission with fever of 102 at home, leukocytosis, cough, brief hypoxia, metabolic encephalopathy.  Lactic acid level on admission was 5.9 however improved to 1.9.  Patient noted to be on prophylactic trimethoprim that is on hold.  Patient pancultured.  Urine cultures with no growth.  COVID-19 PCR negative Continue with Rocephin and azithromycin as patient is showing remarkable clinical improvement.  Appears at mental status baseline currently. .  .  3.  Episode of hypoxia likely from atelectasis versus CAP Hypoxia noted to have resolved.  Due to dementia likely unable to cooperate with flutter valve and incentive spirometry.  Follow.  4.  Paroxysmal atrial fibrillation IV Lopressor for rate control.  Resume Eliquis for anticoagulation tomorrow.  5.  Hypertension Blood pressure elevated.  Increase IV Lopressor to 2.5 mg every 8 hours.  Once tolerating oral intake could likely transition back to home regimen oral Coreg.  6.  Hypothyroidism When tolerating oral intake could resume Synthroid.  7.  Rheumatoid arthritis/PMR On chronic  low-dose steroids.  8.  Bronchiectasis Stable.  On IV antibiotics.  9. Hypoglycemia Blood sugar was reported to be low. Continue to encourage oral intake Monitor fingersticks 3 times daily and maintain  hypoglycemic protocol   DVT prophylaxis: Heparin Code Status: DNR Family Communication: Updated daughter on the telephone. Disposition:   Status is: Inpatient    Dispo: The patient is from: Home              Anticipated d/c is to: Likely home              Anticipated d/c date is: 2 to 3 days.              Patient currently on IV antibiotics, with acute metabolic encephalopathy.  Not stable for discharge.       Consultants:   None  Procedures:   CT head 04/16/2020  Chest x-ray 04/16/2020  Antimicrobials:   IV cefepime 04/16/2020>>>>> 04/17/2020  IV Rocephin 04/17/2020  IV azithromycin 04/16/2020   Subjective: Patient was seen and examined at bedside.  She is sitting up in a chair and feeding herself.  She is not in acute distress.  Appears at mental status baseline.  She was feeding herself unable to answer yes and no to my questions.. Objective: Vitals:   04/17/20 2334 04/18/20 0351 04/18/20 0606 04/18/20 1355  BP: (!) 177/84 (!) 164/83 (!) 158/77 (!) 176/73  Pulse: 89 (!) 101 81 92  Resp:  17  16  Temp:  98.5 F (36.9 C)  98.9 F (37.2 C)  TempSrc:    Oral  SpO2:  98%    Weight:  64.4 kg    Height:        Intake/Output Summary (Last 24 hours) at 04/18/2020 1748 Last data filed at 04/18/2020 1605 Gross per 24 hour  Intake 1650.71 ml  Output 2650 ml  Net -999.29 ml   Filed Weights   04/16/20 0806 04/18/20 0351  Weight: 51 kg 64.4 kg    Examination:  General exam: Appears calm and comfortable.  She is sitting up in a chair.  Able to respond to yes and no questions Respiratory system: Clear to auscultation anterior lung fields. Respiratory effort normal. Cardiovascular system: S1 & S2 heard, RRR. No JVD, murmurs, rubs, gallops or clicks. No pedal edema. Gastrointestinal system: Abdomen is nondistended, soft and nontender. No organomegaly or masses felt. Normal bowel sounds heard. Central nervous system: Alert and oriented. No focal neurological  deficits. Extremities: Symmetric 5 x 5 power. Skin: No rashes, lesions or ulcers Psychiatry: Judgement and insight appear normal. Mood & affect appropriate.     Data Reviewed: I have personally reviewed following labs and imaging studies  CBC: Recent Labs  Lab 04/16/20 0812 04/17/20 0424 04/18/20 0430  WBC 14.3* 14.0* 12.6*  NEUTROABS 11.7*  --   --   HGB 13.9 13.8 13.1  HCT 43.8 44.1 40.1  MCV 98.2 99.8 95.0  PLT 305 228 774    Basic Metabolic Panel: Recent Labs  Lab 04/16/20 0812 04/17/20 0424 04/18/20 0430  NA 136 136 136  K 3.9 3.8 3.5  CL 99 102 100  CO2 _0 GLUCOSE 117* 81 111*  BUN _1 CREATININE 1.01* 0.81 0.82  CALCIUM 9.0 8.7* 8.3*    GFR: Estimated Creatinine Clearance: 41.7 mL/min (by C-G formula based on SCr of 0.82 mg/dL).  Liver Function Tests: Recent Labs  Lab 04/16/20 0812  AST 34  ALT 24  ALKPHOS 112  BILITOT 0.5  PROT 7.8  ALBUMIN 4.0    CBG: Recent Labs  Lab 04/17/20 2313 04/18/20 0347 04/18/20 0739 04/18/20 1133 04/18/20 1640  GLUCAP 104* 83 96 55* 95     Recent Results (from the past 240 hour(s))  Blood Culture (routine x 2)     Status: None (Preliminary result)   Collection Time: 04/16/20  8:12 AM   Specimen: BLOOD  Result Value Ref Range Status   Specimen Description   Final    BLOOD BLOOD RIGHT FOREARM Performed at [facility] [address] [place], [place] [address]    Special Requests   Final    BOTTLES DRAWN AEROBIC AND ANAEROBIC Blood Culture adequate volume Performed at [facility] [address], [place], [place] [address]    Culture   Final    NO GROWTH 2 DAYS Performed at [facility] [address], [place], [place] [address]    Report Status PENDING  Incomplete  Blood Culture (routine x 2)     Status: None (Preliminary result)   Collection Time: 04/16/20  8:20 AM   Specimen: BLOOD  Result Value Ref Range Status   Specimen Description    Final    BLOOD RIGHT ANTECUBITAL Performed at [facility] [address], [place], [place] [address]    Special Requests   Final    BOTTLES DRAWN AEROBIC AND ANAEROBIC Blood Culture results may not be optimal due to an inadequate volume of blood received in culture bottles Performed at [facility] [address], [place], [place] [address]    Culture   Final    NO GROWTH 2 DAYS Performed at [facility] [address] [place], [place] [address]    Report Status PENDING  Incomplete  Urine culture     Status: None   Collection Time: 04/16/20  8:23 AM   Specimen: In/Out Cath Urine  Result Value Ref Range Status   Specimen Description   Final    IN/OUT CATH URINE Performed at [facility] [address] [place], [place] [address]    Special Requests   Final    NONE Performed at [facility], [place] [address], [place], [place] [address]    Culture   Final    NO GROWTH Performed at [facility], [place] [address], [place],  [address]    Report Status 04/17/2020 FINAL  Final  Resp Panel by RT-PCR (Flu A&B, Covid) Nasopharyngeal Swab     Status: None   Collection Time: 04/16/20 11:14 AM   Specimen: Nasopharyngeal Swab; Nasopharyngeal(NP) swabs in vial transport medium  Result Value Ref Range Status   SARS Coronavirus 2 by RT PCR NEGATIVE NEGATIVE Final    Comment: (NOTE) SARS-CoV-2 target nucleic acids are NOT DETECTED.  The SARS-CoV-2 RNA is generally detectable in upper respiratory specimens during the acute phase of infection. The lowest concentration of SARS-CoV-2 viral copies this assay can detect is 138 copies/mL. A negative result does not preclude SARS-Cov-2 infection and should not be used as the sole basis for treatment or other patient management decisions. A negative result may occur with  improper specimen collection/handling, submission of specimen other than  nasopharyngeal swab, presence of viral mutation(s) within the areas targeted by this assay, and inadequate number of viral copies(<138 copies/mL). A negative result must be combined with clinical observations, patient history, and epidemiological information. The expected result is Negative.  Fact Sheet for Patients:  EntrepreneurPulse.com.au  Fact Sheet for Healthcare Providers:  IncredibleEmployment.be  This test is no t yet approved or cleared by the [place] FDA and  has been authorized for detection and/or diagnosis of SARS-CoV-2 by FDA under an Emergency Use Authorization (EUA). This EUA will remain  in effect (meaning this test can be used) for the duration of the COVID-19 declaration under Section 564(b)(1) of the Act, 21 U.S.C.section 360bbb-3(b)(1), unless the authorization is terminated  or revoked sooner.       Influenza A by PCR NEGATIVE NEGATIVE Final   Influenza B by PCR NEGATIVE NEGATIVE Final    Comment: (NOTE) The Xpert Xpress SARS-CoV-2/FLU/RSV plus assay is intended as an aid in the diagnosis of influenza from Nasopharyngeal swab specimens and should not be used as a sole basis for treatment. Nasal washings and aspirates are unacceptable for Xpert Xpress SARS-CoV-2/FLU/RSV testing.  Fact Sheet for Patients: EntrepreneurPulse.com.au  Fact Sheet for Healthcare Providers: IncredibleEmployment.be  This test is not yet approved or cleared by the [place] FDA and has been authorized for detection and/or diagnosis of SARS-CoV-2 by FDA under an Emergency Use Authorization (EUA). This EUA will remain in effect (meaning this test can be used) for the duration of the COVID-19 declaration under Section 564(b)(1) of the Act, 21 U.S.C. section 360bbb-3(b)(1), unless the authorization is terminated or revoked.  Performed at [facility], [place] [address], [place], [place] [address]   MRSA PCR Screening     Status: None   Collection Time: 04/16/20 11:52 AM   Specimen: Nasal Mucosa; Nasopharyngeal  Result Value Ref Range Status   MRSA by PCR NEGATIVE NEGATIVE Final    Comment:        The GeneXpert MRSA Assay (FDA approved for NASAL specimens only), is one component of a comprehensive MRSA colonization surveillance program. It is not intended to diagnose MRSA infection nor to guide or monitor treatment for MRSA infections. Performed at [facility], [place] [address] [place], [place] [address]          Radiology Studies: No results found.      Scheduled Meds: . heparin  5,000 Units Subcutaneous Q8H  . metoprolol tartrate  2.5 mg Intravenous Q8H  . sodium chloride flush  3 mL Intravenous Q12H   Continuous Infusions: . azithromycin 500 mg (04/18/20 1136)  . cefTRIAXone (ROCEPHIN)  IV Stopped (04/17/20 2115)  . dextrose 5 % and 0.9% NaCl 100 mL/hr at 04/17/20 2251     LOS: 2 days    Time spent: 33 minutes    Elie Confer, MD [facility] Pager: [phone]  To contact the attending provider between 7A-7P or the covering provider during after hours 7P-7A, please log into the web site [email or web address] and access using universal [place] password for that web site. If you do not have the password, please call the hospital operator.  04/18/2020, 5:48 PM

## 2020-04-18 NOTE — Plan of Care (Signed)
POC discussed with pt and family member. Pt nonverbal, shows no signs of distress, VSS. Bed wheels lock, bed alarm on, call bell within reach, no falls during shift Problem: Education: Goal: Knowledge of General Education information will improve Description: Including pain rating scale, medication(s)/side effects and non-pharmacologic comfort measures Outcome: Progressing   Problem: Health Behavior/Discharge Planning: Goal: Ability to manage health-related needs will improve Outcome: Progressing   Problem: Clinical Measurements: Goal: Ability to maintain clinical measurements within normal limits will improve Outcome: Progressing Goal: Will remain free from infection Outcome: Progressing Goal: Diagnostic test results will improve Outcome: Progressing Goal: Respiratory complications will improve Outcome: Progressing Goal: Cardiovascular complication will be avoided Outcome: Progressing   Problem: Activity: Goal: Risk for activity intolerance will decrease Outcome: Progressing   Problem: Nutrition: Goal: Adequate nutrition will be maintained Outcome: Progressing   Problem: Coping: Goal: Level of anxiety will decrease Outcome: Progressing   Problem: Elimination: Goal: Will not experience complications related to bowel motility Outcome: Progressing Goal: Will not experience complications related to urinary retention Outcome: Progressing   Problem: Pain Managment: Goal: General experience of comfort will improve Outcome: Progressing   Problem: Safety: Goal: Ability to remain free from injury will improve Outcome: Progressing   Problem: Skin Integrity: Goal: Risk for impaired skin integrity will decrease Outcome: Progressing

## 2020-04-19 ENCOUNTER — Inpatient Hospital Stay (HOSPITAL_COMMUNITY): Payer: Medicare Other

## 2020-04-19 DIAGNOSIS — G9341 Metabolic encephalopathy: Secondary | ICD-10-CM | POA: Diagnosis not present

## 2020-04-19 LAB — CBC
HCT: 40.2 % (ref 36.0–46.0)
Hemoglobin: 13 g/dL (ref 12.0–15.0)
MCH: 31.6 pg (ref 26.0–34.0)
MCHC: 32.3 g/dL (ref 30.0–36.0)
MCV: 97.8 fL (ref 80.0–100.0)
Platelets: 153 10*3/uL (ref 150–400)
RBC: 4.11 MIL/uL (ref 3.87–5.11)
RDW: 12.8 % (ref 11.5–15.5)
WBC: 12.8 10*3/uL — ABNORMAL HIGH (ref 4.0–10.5)
nRBC: 0 % (ref 0.0–0.2)

## 2020-04-19 LAB — COMPREHENSIVE METABOLIC PANEL
ALT: 28 U/L (ref 0–44)
AST: 39 U/L (ref 15–41)
Albumin: 3.1 g/dL — ABNORMAL LOW (ref 3.5–5.0)
Alkaline Phosphatase: 129 U/L — ABNORMAL HIGH (ref 38–126)
Anion gap: 11 (ref 5–15)
BUN: 7 mg/dL — ABNORMAL LOW (ref 8–23)
CO2: 25 mmol/L (ref 22–32)
Calcium: 8.4 mg/dL — ABNORMAL LOW (ref 8.9–10.3)
Chloride: 99 mmol/L (ref 98–111)
Creatinine, Ser: 0.81 mg/dL (ref 0.44–1.00)
GFR, Estimated: >60 mL/min (ref >60)
Glucose, Bld: 115 mg/dL — ABNORMAL HIGH (ref 70–99)
Potassium: 3.3 mmol/L — ABNORMAL LOW (ref 3.5–5.1)
Sodium: 135 mmol/L (ref 135–145)
Total Bilirubin: 0.6 mg/dL (ref 0.3–1.2)
Total Protein: 6.4 g/dL — ABNORMAL LOW (ref 6.5–8.1)

## 2020-04-19 LAB — MAGNESIUM: Magnesium: 1.6 mg/dL — ABNORMAL LOW (ref 1.7–2.4)

## 2020-04-19 LAB — GLUCOSE, CAPILLARY
Glucose-Capillary: 87 mg/dL (ref 70–99)
Glucose-Capillary: 105 mg/dL — ABNORMAL HIGH (ref 70–99)
Glucose-Capillary: 89 mg/dL (ref 70–99)
Glucose-Capillary: 94 mg/dL (ref 70–99)
Glucose-Capillary: 131 mg/dL — ABNORMAL HIGH (ref 70–99)
Glucose-Capillary: 114 mg/dL — ABNORMAL HIGH (ref 70–99)

## 2020-04-19 LAB — PHOSPHORUS: Phosphorus: 1.9 mg/dL — ABNORMAL LOW (ref 2.5–4.6)

## 2020-04-19 MED ORDER — ACETAMINOPHEN 650 MG RE SUPP: 650.0000 mg | Freq: Four times a day (QID) | RECTAL | Status: DC | PRN

## 2020-04-19 MED ORDER — ACETAMINOPHEN 325 MG PO TABS
650.0000 mg | ORAL_TABLET | Freq: Four times a day (QID) | ORAL | Status: DC | PRN
  Administered 2020-04-19: 650 mg via ORAL

## 2020-04-19 MED ORDER — MOMETASONE FURO-FORMOTEROL FUM 100-5 MCG/ACT IN AERO
2.0000 | INHALATION_SPRAY | Freq: Two times a day (BID) | RESPIRATORY_TRACT | Status: DC
  Administered 2020-04-20: 2 via RESPIRATORY_TRACT
  Filled 2020-04-19: qty 8.8

## 2020-04-19 MED ORDER — METOPROLOL TARTRATE 5 MG/5ML IV SOLN: 5.0000 mg | Freq: Three times a day (TID) | INTRAVENOUS | Status: DC

## 2020-04-19 MED ORDER — LEVOTHYROXINE SODIUM 75 MCG PO TABS
75.0000 ug | ORAL_TABLET | Freq: Every day | ORAL | Status: DC
  Administered 2020-04-20: 75 ug via ORAL
  Filled 2020-04-19: qty 1

## 2020-04-19 MED ORDER — MAGNESIUM SULFATE 2 GM/50ML IV SOLN
2.0000 g | Freq: Once | INTRAVENOUS | Status: AC
  Administered 2020-04-19: 2 g via INTRAVENOUS
  Filled 2020-04-19: qty 50

## 2020-04-19 MED ORDER — APIXABAN 2.5 MG PO TABS
2.5000 mg | ORAL_TABLET | Freq: Two times a day (BID) | ORAL | Status: DC
  Administered 2020-04-19 – 2020-04-20 (×2): 2.5 mg via ORAL
  Filled 2020-04-19 (×2): qty 1

## 2020-04-19 MED ORDER — CARVEDILOL 12.5 MG PO TABS
12.5000 mg | ORAL_TABLET | Freq: Two times a day (BID) | ORAL | Status: DC
  Administered 2020-04-19 – 2020-04-20 (×2): 12.5 mg via ORAL
  Filled 2020-04-19 (×2): qty 1

## 2020-04-19 MED ORDER — ALBUTEROL SULFATE HFA 108 (90 BASE) MCG/ACT IN AERS: 1.0000 | INHALATION_SPRAY | Freq: Four times a day (QID) | RESPIRATORY_TRACT | Status: DC | PRN

## 2020-04-19 MED ORDER — POTASSIUM PHOSPHATES 15 MMOLE/5ML IV SOLN
20.0000 mmol | Freq: Once | INTRAVENOUS | Status: AC
  Administered 2020-04-19: 20 mmol via INTRAVENOUS
  Filled 2020-04-19: qty 6.67

## 2020-04-19 MED ORDER — PREDNISONE 1 MG PO TABS
3.0000 mg | ORAL_TABLET | Freq: Every day | ORAL | Status: DC
  Administered 2020-04-20: 3 mg via ORAL
  Filled 2020-04-19 (×2): qty 3

## 2020-04-19 MED ORDER — AMLODIPINE BESYLATE 5 MG PO TABS
2.5000 mg | ORAL_TABLET | Freq: Every day | ORAL | Status: DC
  Administered 2020-04-19 – 2020-04-20 (×2): 2.5 mg via ORAL
  Filled 2020-04-19 (×2): qty 1

## 2020-04-19 MED ORDER — VITAMIN D3 25 MCG (1000 UNIT) PO TABS
1000.0000 [IU] | ORAL_TABLET | Freq: Every day | ORAL | Status: DC
  Administered 2020-04-19 – 2020-04-20 (×2): 1000 [IU] via ORAL
  Filled 2020-04-19 (×4): qty 1

## 2020-04-19 MED ORDER — AMITRIPTYLINE HCL 50 MG PO TABS
50.0000 mg | ORAL_TABLET | Freq: Every day | ORAL | Status: DC
  Administered 2020-04-19: 50 mg via ORAL
  Filled 2020-04-19: qty 1

## 2020-04-19 MED ORDER — MEMANTINE HCL 10 MG PO TABS
10.0000 mg | ORAL_TABLET | Freq: Every day | ORAL | Status: DC
  Administered 2020-04-19 – 2020-04-20 (×2): 10 mg via ORAL
  Filled 2020-04-19 (×2): qty 1

## 2020-04-19 NOTE — Evaluation (Signed)
Occupational Therapy Evaluation Patient Details Name: Jenna Carrillo MRN: [id number] DOB: 1931-03-03 Today's Date: 04/19/2020    History of Present Illness Thisi s an 86 year old female with a history of COPD, RA, polymyalgia rheumatica, paroxysmal atrial fibrillation on eliquis, hypothyroidism, hypertension, hyperlipidemia, HFrEF, recurrent polymicrobial UTIs on trimethoprim prophylaxis, dementia who presented to the ED 04/16/20 with fever, lethargic with generalized weakness. Patient lives with daughter and is typically relatively independent at home but daughter helps with some tasks. Patient is essentially nonverbal but does say 'yes/no'   Clinical Impression   Ms. Jenna Carrillo is an 86 year old woman who presents with significant confusion, generalized weakness, decreased activity tolerance and impaired balance resulting in requiring +2 assistance for standing and ambulation, total assist for toileting and LB ADLs and max assist for UB ADLs. Patient will benefit from skilled OT services while in hospital to improve deficits and learn compensatory strategies as needed in order to return PLOF.  Patient's prior level of function unknown and patient unable to answer questions. Would benefit from short term rehab at this time if limited improvement during hospitalization. If patient improves and has 24/7 assistance may be able to return home.    Follow Up Recommendations  Supervision/Assistance - 24 hour;SNF    Equipment Recommendations  Other (comment) (TBD)    Recommendations for Other Services       Precautions / Restrictions Precautions Precautions: Fall Precaution Comments: incontinence Restrictions Weight Bearing Restrictions: No      Mobility Bed Mobility Overal bed mobility: Needs Assistance Bed Mobility: Supine to Sit     Supine to sit: Mod assist     General bed mobility comments: asssit with legs and 1 UE to pull upright from therapists support.     Transfers Overall transfer level: Needs assistance Equipment used: 2 person hand held assist Transfers: Sit to/from Stand Sit to Stand: +2 safety/equipment         General transfer comment: Two hand holds and mod assist to stand from lower bed. 2 hand holds and tactile cues to ambulate patient to bathroom and perform toilet transfer. Patient needs the tactile cues to move otherwise would stand in place. Patient's legs scissor during turns.    Balance Overall balance assessment: Needs assistance Sitting-balance support: No upper extremity supported;Feet supported Sitting balance-Leahy Scale: Fair     Standing balance support: Bilateral upper extremity supported Standing balance-Leahy Scale: Poor                             ADL either performed or assessed with clinical judgement   ADL Overall ADL's : Needs assistance/impaired Eating/Feeding: Set up;Sitting   Grooming: Maximal assistance;Sitting;Cueing for sequencing   Upper Body Bathing: Set up;Maximal assistance;Sitting;Cueing for sequencing   Lower Body Bathing: Total assistance;Set up;Cueing for sequencing;Sit to/from stand   Upper Body Dressing : Set up;Maximal assistance;Cueing for sequencing   Lower Body Dressing: Total assistance;+2 for safety/equipment;Sit to/from stand;Cueing for sequencing   Toilet Transfer: +2 for safety/equipment;+2 for physical assistance;Maximal assistance;Grab bars;Regular Toilet;Cueing for sequencing Toilet Transfer Details (indicate cue type and reason): +2 to transfer to toiletin - patient unable to follow commands requiring tactile cues to get patient in seated position and placing patient's hand on grab bar. Toileting- Clothing Manipulation and Hygiene: Total assistance;+2 for physical assistance;+2 for safety/equipment;Sit to/from stand Toileting - Clothing Manipulation Details (indicate cue type and reason): Total assist for cleaning up after incontinent episode standing in  front  of toilet.             Vision   Vision Assessment?: No apparent visual deficits     Perception     Praxis      Pertinent Vitals/Pain Pain Assessment: 0-10 Faces Pain Scale: No hurt     Hand Dominance     Extremity/Trunk Assessment     Lower Extremity Assessment Lower Extremity Assessment: Generalized weakness (feet/legs in cosntant motion when not standing up.)   Cervical / Trunk Assessment Cervical / Trunk Assessment: Kyphotic   Communication Communication Communication: Expressive difficulties;Receptive difficulties (says a few words,)   Cognition Arousal/Alertness: Awake/alert Behavior During Therapy: WFL for tasks assessed/performed Overall Cognitive Status: No family/caregiver present to determine baseline cognitive functioning                                 General Comments: patient does follow simple tactile/visual cues when spoken  to directly and firmly   General Comments       Exercises     Shoulder Instructions      Home Living Family/patient expects to be discharged to:: Private residence Living Arrangements: Children Available Help at Discharge: Family;Available 24 hours/day Type of Home: House       Home Layout: One level               Home Equipment: Walker - 4 wheels   Additional Comments: pt unable to provide history due to cognition. Environment from chart review/prior admission.      Prior Functioning/Environment          Comments: chart indicates patient ambulatory in home        OT Problem List: Decreased strength;Decreased activity tolerance;Impaired balance (sitting and/or standing);Decreased cognition;Decreased safety awareness;Decreased knowledge of use of DME or AE      OT Treatment/Interventions: Therapeutic exercise;Self-care/ADL training;DME and/or AE instruction;Therapeutic activities;Cognitive remediation/compensation;Balance training;Patient/family education    OT Goals(Current  goals can be found in the care plan section) Acute Rehab OT Goals OT Goal Formulation: Patient unable to participate in goal setting Time For Goal Achievement: 05/03/20 Potential to Achieve Goals: Fair  OT Frequency: Min 2X/week   Barriers to D/C:            Co-evaluation PT/OT/SLP Co-Evaluation/Treatment: Yes Reason for Co-Treatment: For patient/therapist safety PT goals addressed during session: Mobility/safety with mobility OT goals addressed during session: ADL's and self-care      AM-PAC OT "6 Clicks" Daily Activity     Outcome Measure Help from another person eating meals?: A Little Help from another person taking care of personal grooming?: A Lot Help from another person toileting, which includes using toliet, bedpan, or urinal?: Total Help from another person bathing (including washing, rinsing, drying)?: A Lot Help from another person to put on and taking off regular upper body clothing?: A Lot Help from another person to put on and taking off regular lower body clothing?: Total 6 Click Score: 11   End of Session Equipment Utilized During Treatment: Gait belt;Rolling walker Nurse Communication: Mobility status  Activity Tolerance: Patient tolerated treatment well Patient left: in chair;with call bell/phone within reach;with chair alarm set  OT Visit Diagnosis: Unsteadiness on feet (R26.81);Other symptoms and signs involving cognitive function;History of falling (Z91.81)                Time: 0786-7544 OT Time Calculation (min): 25 min Charges:  OT General Charges $OT Visit: 1 Visit OT Evaluation $  OT Eval Moderate Complexity: 1 Mod  Alfard Cochrane, OTR/L Acute Care Rehab Services  Office [phone] Pager: [phone]   Kelli Churn 04/19/2020, 1:11 PM

## 2020-04-19 NOTE — Progress Notes (Signed)
  Speech Language Pathology Treatment: Dysphagia  Patient Details Name: Jenna Carrillo MRN: [id number] DOB: [birth year] Today's Date: 04/19/2020 Time: 1250-1305 SLP Time Calculation (min) (ACUTE ONLY): 15 min  Assessment / Plan / Recommendation Clinical Impression  Pt seen for skilled dysphagia treatment and determination of effective compensation strategies for dysphagia mitigation.    Pt with open mouth posture breathing slumped in chair upon entrance to room. SLP obtained help to slide her up and conducted po trials.  Use of hand over hand for feeding improved pt's efficiency of swallowing and triggered pt to swallow prior to accepting next bolus when she demonstrated prolonged oral holding.  Icecream bolus assisted to transit cereal bar bolus that pt masticated, Pt did not feed herself independently today but did not demonstrate any indication of airway compromise.  Her delays in swallow initiation were significant and up to 28 seconds without use of strategies to facilitate swallowing.  Pt will need to follow very strict aspiration/dysphagia precautions to aid in nutrition and swallowing efficency with airway protection.  Given swallow efficiency is much improved with liquids compared to solids, maximize liquid nutrition advised.    No family present, upper dentures in place currently. Swallow precautions posted for staff to use for instructions on safety with swallowing. Will follow up for family education and to assure tolerance.    HPI HPI: Patient is an 86 y.o. female with PMH: dementia, COPD, RA, polymyalgia rheumatica, paroxysmal atrial fibrillation on Eliquis, hypothyroidism, HLD, HFrEF, recurrent polymicrobial UTI's and per family is primarily non-verbal. She was admitted secondary to acting lethargic with generalized weakness at home (lives with daughter) followed by fevere of 102F at home, which prompted daughter to call EMS. She was afebrile, hyptertensive and on room air in ED and CT  head did not reveal any acute abnormality; Covid 19 negative, UA with hazy urine but otherwise unremarkable.      SLP Plan  Continue with current plan of care       Recommendations  Diet recommendations: Dysphagia 2 (fine chop);Thin liquid Liquids provided via: Cup;Straw (use tip of straw to aid in pt acclimation to swallowing liquid via straw) Medication Administration: Crushed with puree Supervision: Full supervision/cueing for compensatory strategies;Staff to assist with self feeding (hand over hand feeding assist) Compensations: Minimize environmental distractions;Slow rate;Small sips/bites;Other (Comment) (use puree to transit solids) Postural Changes and/or Swallow Maneuvers: Seated upright 90 degrees;Upright 30-60 min after meal                Oral Care Recommendations: Oral care BID Follow up Recommendations: Home health SLP;24 hour supervision/assistance SLP Visit Diagnosis: Dysphagia, unspecified (R13.10) Plan: Continue with current plan of care       GO                Jenna Carrillo 04/19/2020, 1:12 PM   Jenna Infante, MS [facility] SLP Acute Rehab Services Office [phone] Pager [phone]

## 2020-04-19 NOTE — Progress Notes (Signed)
PROGRESS NOTE    Jenna Carrillo  VEH:[id number] DOB: 07/22/1930 DOA: 04/16/2020 PCP: Shon Baton, MD   Chief Complaint  Patient presents with  . Fever    Brief Narrative:  HPI per Dr. Neysa Bonito History obtained from ED note and daughter over the phone as the patient is nonverbal at baseline  Thisi s an 86 year old female with a history of COPD, RA, polymyalgia rheumatica, paroxysmal atrial fibrillation on eliquis, hypothyroidism, hypertension, hyperlipidemia, HFrEF, recurrent polymicrobial UTIs on trimethoprim prophylaxis, dementia who presented to the ED with fever since this AM. Per daughter, the patient started acting lethargic with generalized weakness yesterday but no fevers at the time. Overnight she had a mild cough though she does occasionally have a cough due to her underlying COPD. This AM she had a fever of 102F, prompting call to EMS. Daughter denies any diarrhea. Patient lives with daughter and is typically relatively independent at home but daughter helps with some tasks. Patient is essentially nonverbal but does say 'yes/no' however today she was not speaking. Her symptoms are typical of prior UTIs in the past. Daughter says that her UA's are typically negative but urine cultures are positive.  Patient is laying in bed and alert but not responding verbally.  PT evaluated.  I spoke with patient's daughter and plan is for possible discharge home tomorrow 04/20/2020.   ED Course: Afebrile, hypertensive, on room air. Notable Labs: Na 136, K 3.9, Glucose 117, BUN 15, Cr 1.01, Lactic acid 1.1, WBC 14.3, UA with Hazy urine but otherwise unremarkable, COVID 19 pending. Notable Imaging: CXR-right basilar atelectasis or scarring. CT head without acute abnormality. Patient received Cefepime, 500 mL NS bolus and LR maintenance.  Assessment & Plan:   Principal Problem:   Acute metabolic encephalopathy Active Problems:   Essential hypertension   Polymyalgia rheumatica (HCC)   Current  chronic use of systemic steroids   AF (paroxysmal atrial fibrillation) (HCC)   AMS (altered mental status)   Hypothyroidism  1 acute metabolic encephalopathy in the setting of dementia Concern for infectious etiology.  Patient noted at baseline to be nonverbal but more energetic.  Patient noted to use simple answers of yes or no.  Urinalysis on admission noted to be negative however per family urine cultures in the past have come back positive.  Chest x-ray noted with some atelectasis.  Patient noted to have some respiratory symptoms on admission and concern for possible community-acquired pneumonia. Patient is alert on sitting out in the chair and feeding herself.  Able to answer yes and no questions.  Patient appears at baseline.  2.  Sepsis without organ damage, present on admission. SIRS with likely source from CAP/aspiration pneumonia Patient met criteria for SIRS on admission with fever of 102 at home, leukocytosis, cough, brief hypoxia, metabolic encephalopathy.  Lactic acid level on admission was 5.9 however improved to 1.9.  Patient noted to be on prophylactic trimethoprim that is on hold.  Patient pancultured. Urine cultures with no growth.  COVID-19 PCR negative Continue with Rocephin and azithromycin as patient is showing remarkable clinical improvement.  Appears at mental status baseline currently. Plan for possible discharge home tomorrow if clinically and hemodynamically stable.  PT/OT on board. .  .  3.  Episode of hypoxia likely from atelectasis versus CAP Hypoxia noted to have resolved.  Due to dementia likely unable to cooperate with flutter valve and incentive spirometry.  Follow.  4.  Paroxysmal atrial fibrillation Restarted patient's carvedilol at 12.5 mg twice daily Added  amlodipine 2.5 mg daily Eliquis 2.5 mg twice daily continued  5.  Hypertension Blood pressure elevated. Continue with home Coreg and amlodipine. Uptitrate blood pressures necessary   6.   Hypothyroidism Continue with levothyroxine .  7.  Rheumatoid arthritis/PMR Resume prednisone at home dose   8.  Bronchiectasis Stable.  9. Hypoglycemia Blood sugar was reported to be low. Continue to encourage oral intake Monitor fingersticks 3 times daily and maintain hypoglycemic protocol   DVT prophylaxis: Eliquis Code Status: DNR Family Communication: I spoke with patient's daughter and discussed plan of care and possible discharge home tomorrow  Disposition: Possible discharge home tomorrow 04/20/2020  Status is: Inpatient    Dispo: The patient is from: Home              Anticipated d/c is to: Likely home              Anticipated d/c date is: 04/20/2020.              Patient currently on IV antibiotics, with acute metabolic encephalopathy.       Consultants:   None  Procedures:   CT head 04/16/2020  Chest x-ray 04/16/2020  Antimicrobials:   IV cefepime 04/16/2020>>>>> 04/17/2020  IV Rocephin 04/17/2020  IV azithromycin 04/16/2020   Subjective: Patient was seen and examined at bedside.  She is laying in bed quietly and not in acute distress.  She is alert but not verbalizing.   Appears at mental status baseline.   Objective: Vitals:   04/19/20 0400 04/19/20 0434 04/19/20 0537 04/19/20 1437  BP: (!) 196/85 (!) 173/88 (!) 176/78 (!) 152/81  Pulse: (!) 105 88 98 99  Resp: 18   20  Temp: 97.7 F (36.5 C)   98.4 F (36.9 C)  TempSrc:    Oral  SpO2: 98% 97% 92% 94%  Weight:      Height:        Intake/Output Summary (Last 24 hours) at 04/19/2020 1507 Last data filed at 04/19/2020 1438 Gross per 24 hour  Intake 710 ml  Output 1400 ml  Net -690 ml   Filed Weights   04/16/20 0806 04/18/20 0351  Weight: 51 kg 64.4 kg    Examination:  General exam: Appears calm and comfortable.  She is laying in bed, alert and nonverbal.  She is not in acute distress. Respiratory system: Clear to auscultation anterior lung fields. Respiratory effort  normal. Cardiovascular system: S1 & S2 heard, RRR. No JVD, murmurs, rubs, gallops or clicks. No pedal edema. Gastrointestinal system: Abdomen is nondistended, soft and nontender. No organomegaly or masses felt. Normal bowel sounds heard. Central nervous system: Alert and oriented. No focal neurological deficits. Extremities: Symmetric 5 x 5 power. Skin: No rashes, lesions or ulcers Psychiatry: Judgement and insight appear normal. Mood & affect appropriate.     Data Reviewed: I have personally reviewed following labs and imaging studies  CBC: Recent Labs  Lab 04/16/20 0812 04/17/20 0424 04/18/20 0430 04/19/20 0417  WBC 14.3* 14.0* 12.6* 12.8*  NEUTROABS 11.7*  --   --   --   HGB 13.9 13.8 13.1 13.0  HCT 43.8 44.1 40.1 40.2  MCV 98.2 99.8 95.0 97.8  PLT 305 228 181 353    Basic Metabolic Panel: Recent Labs  Lab 04/16/20 0812 04/17/20 0424 04/18/20 0430 04/19/20 0417  NA 136 136 136 135  K 3.9 3.8 3.5 3.3*  CL 99 102 100 99  CO2 '26 25 28 25  ' GLUCOSE 117* 81 111* 115*  BUN '15 11 8 ' 7*  CREATININE 1.01* 0.81 0.82 0.81  CALCIUM 9.0 8.7* 8.3* 8.4*  MG  --   --   --  1.6*  PHOS  --   --   --  1.9*    GFR: Estimated Creatinine Clearance: 42.3 mL/min (by C-G formula based on SCr of 0.81 mg/dL).  Liver Function Tests: Recent Labs  Lab 04/16/20 0812 04/19/20 0417  AST 34 39  ALT 24 28  ALKPHOS 112 129*  BILITOT 0.5 0.6  PROT 7.8 6.4*  ALBUMIN 4.0 3.1*    CBG: Recent Labs  Lab 04/18/20 2007 04/18/20 2349 04/19/20 0356 04/19/20 0731 04/19/20 1135  GLUCAP 100* 99 87 105* 89     Recent Results (from the past 240 hour(s))  Blood Culture (routine x 2)     Status: None (Preliminary result)   Collection Time: 04/16/20  8:12 AM   Specimen: BLOOD  Result Value Ref Range Status   Specimen Description   Final    BLOOD BLOOD RIGHT FOREARM Performed at [facility], [place] [address], [place] [address], [place] [address]    Special Requests   Final     BOTTLES DRAWN AEROBIC AND ANAEROBIC Blood Culture adequate volume Performed at [place] [address], [place], [place] [address]    Culture   Final    NO GROWTH 3 DAYS Performed at [facility], [place] [address], [place], [place] [address]    Report Status PENDING  Incomplete  Blood Culture (routine x 2)     Status: None (Preliminary result)   Collection Time: 04/16/20  8:20 AM   Specimen: BLOOD  Result Value Ref Range Status   Specimen Description   Final    BLOOD RIGHT ANTECUBITAL Performed at [place] [address], [place], [place] [address]    Special Requests   Final    BOTTLES DRAWN AEROBIC AND ANAEROBIC Blood Culture results may not be optimal due to an inadequate volume of blood received in culture bottles Performed at [place] [address], [place], [place] [address]    Culture   Final    NO GROWTH 3 DAYS Performed at [facility], [place] [address], [place], [place] [address]    Report Status PENDING  Incomplete  Urine culture     Status: None   Collection Time: 04/16/20  8:23 AM   Specimen: In/Out Cath Urine  Result Value Ref Range Status   Specimen Description   Final    IN/OUT CATH URINE Performed at [place] [address] [place], [place] [address]    Special Requests   Final    NONE Performed at [facility], [place] [address], [place], [place] [address]    Culture   Final    NO GROWTH Performed at [facility], [place] [address] [place], [place] [address]    Report Status 04/17/2020 FINAL  Final  Resp Panel by RT-PCR (Flu A&B, Covid) Nasopharyngeal Swab     Status: None   Collection Time: 04/16/20 11:14 AM   Specimen: Nasopharyngeal Swab; Nasopharyngeal(NP) swabs in vial transport medium  Result Value Ref Range Status   SARS Coronavirus 2 by RT PCR NEGATIVE NEGATIVE Final    Comment: (NOTE) SARS-CoV-2 target nucleic acids  are NOT DETECTED.  The SARS-CoV-2 RNA is generally detectable in upper respiratory specimens during the acute phase of infection. The lowest concentration of SARS-CoV-2 viral  copies this assay can detect is 138 copies/mL. A negative result does not preclude SARS-Cov-2 infection and should not be used as the sole basis for treatment or other patient management decisions. A negative result may occur with  improper specimen collection/handling, submission of specimen other than nasopharyngeal swab, presence of viral mutation(s) within the areas targeted by this assay, and inadequate number of viral copies(<138 copies/mL). A negative result must be combined with clinical observations, patient history, and epidemiological information. The expected result is Negative.  Fact Sheet for Patients:  EntrepreneurPulse.com.au  Fact Sheet for Healthcare Providers:  IncredibleEmployment.be  This test is no t yet approved or cleared by the [place] FDA and  has been authorized for detection and/or diagnosis of SARS-CoV-2 by FDA under an Emergency Use Authorization (EUA). This EUA will remain  in effect (meaning this test can be used) for the duration of the COVID-19 declaration under Section 564(b)(1) of the Act, 21 U.S.C.section 360bbb-3(b)(1), unless the authorization is terminated  or revoked sooner.       Influenza A by PCR NEGATIVE NEGATIVE Final   Influenza B by PCR NEGATIVE NEGATIVE Final    Comment: (NOTE) The Xpert Xpress SARS-CoV-2/FLU/RSV plus assay is intended as an aid in the diagnosis of influenza from Nasopharyngeal swab specimens and should not be used as a sole basis for treatment. Nasal washings and aspirates are unacceptable for Xpert Xpress SARS-CoV-2/FLU/RSV testing.  Fact Sheet for Patients: EntrepreneurPulse.com.au  Fact Sheet for Healthcare Providers: IncredibleEmployment.be  This test is  not yet approved or cleared by the [place] FDA and has been authorized for detection and/or diagnosis of SARS-CoV-2 by FDA under an Emergency Use Authorization (EUA). This EUA will remain in effect (meaning this test can be used) for the duration of the COVID-19 declaration under Section 564(b)(1) of the Act, 21 U.S.C. section 360bbb-3(b)(1), unless the authorization is terminated or revoked.  Performed at [facility], [place] [address], [place], [place] [address]   MRSA PCR Screening     Status: None   Collection Time: 04/16/20 11:52 AM   Specimen: Nasal Mucosa; Nasopharyngeal  Result Value Ref Range Status   MRSA by PCR NEGATIVE NEGATIVE Final    Comment:        The GeneXpert MRSA Assay (FDA approved for NASAL specimens only), is one component of a comprehensive MRSA colonization surveillance program. It is not intended to diagnose MRSA infection nor to guide or monitor treatment for MRSA infections. Performed at [facility], [place] [address] [place], [place] [address]          Radiology Studies: DG Chest 1 View  Result Date: 04/19/2020 CLINICAL DATA:  Dyspnea on exertion EXAM: CHEST  1 VIEW COMPARISON:  April 16, 2020. FINDINGS: The heart size and mediastinal contours are within normal limits. Both lungs are clear. No pneumothorax or pleural effusion is noted. The visualized skeletal structures are unremarkable. IMPRESSION: No active disease. Aortic Atherosclerosis (ICD10-I70.0). Electronically Signed   By: Marijo Conception M.D.   On: 04/19/2020 09:55        Scheduled Meds: . heparin  5,000 Units Subcutaneous Q8H  . metoprolol tartrate  5 mg Intravenous Q8H  . sodium chloride flush  3 mL Intravenous Q12H   Continuous Infusions: . azithromycin 500 mg (04/19/20 1123)  . cefTRIAXone (ROCEPHIN)  IV Stopped (04/18/20 2045)  . dextrose 5 % and 0.9% NaCl 100 mL/hr at 04/19/20 0532  . potassium PHOSPHATE IVPB (in mmol) 20  mmol (04/19/20  1200)     LOS: 3 days    Time spent: 35 minutes    Elie Confer, MD [facility] Pager: [phone]  r.  04/19/2020, 3:07 PM

## 2020-04-19 NOTE — Care Management Important Message (Signed)
Important Message  Patient Details IM Letter given to the Patient. Name: Jenna Carrillo MRN: [id number] Date of Birth: 01-09-1931   Medicare Important Message Given:  Yes     [name] 04/19/2020, 12:25 PM

## 2020-04-19 NOTE — Plan of Care (Signed)
POC discussed with pt, pt nonverbal, shows no signs of distress, VSS. Bed wheels locked, nonskid footwear applied OOB, call bell within reach, encouraged to use call bell for assistance, no falls during shift  Problem: Education: Goal: Knowledge of General Education information will improve Description: Including pain rating scale, medication(s)/side effects and non-pharmacologic comfort measures Outcome: Progressing   Problem: Health Behavior/Discharge Planning: Goal: Ability to manage health-related needs will improve Outcome: Progressing   Problem: Clinical Measurements: Goal: Ability to maintain clinical measurements within normal limits will improve Outcome: Progressing Goal: Will remain free from infection Outcome: Progressing Goal: Diagnostic test results will improve Outcome: Progressing Goal: Respiratory complications will improve Outcome: Progressing Goal: Cardiovascular complication will be avoided Outcome: Progressing   Problem: Activity: Goal: Risk for activity intolerance will decrease Outcome: Progressing   Problem: Nutrition: Goal: Adequate nutrition will be maintained Outcome: Progressing   Problem: Coping: Goal: Level of anxiety will decrease Outcome: Progressing   Problem: Elimination: Goal: Will not experience complications related to bowel motility Outcome: Progressing Goal: Will not experience complications related to urinary retention Outcome: Progressing   Problem: Pain Managment: Goal: General experience of comfort will improve Outcome: Progressing   Problem: Safety: Goal: Ability to remain free from injury will improve Outcome: Progressing   Problem: Skin Integrity: Goal: Risk for impaired skin integrity will decrease Outcome: Progressing

## 2020-04-19 NOTE — Evaluation (Signed)
Physical Therapy Evaluation Patient Details Name: Jenna Carrillo MRN: [id number] DOB: 21-Apr-1931 Today's Date: 04/19/2020   History of Present Illness  Thisi s an 86 year old female with a history of COPD, RA, polymyalgia rheumatica, paroxysmal atrial fibrillation on eliquis, hypothyroidism, hypertension, hyperlipidemia, HFrEF, recurrent polymicrobial UTIs on trimethoprim prophylaxis, dementia who presented to the ED 04/16/20 with fever, lethargic with generalized weakness. Patient lives with daughter and is typically relatively independent at home but daughter helps with some tasks. Patient is essentially nonverbal but does say 'yes/no'  Clinical Impression   The patient  Is awake and alert, answers with yes/no inconsistently. Follows gestures/tactile cues inconsistently/ Patient ambulated with 2 Handhold or IV pole and Hand hold to BR and back. Incontinent of urine just befor the toilet.. Patient has been living with daughter, [name] how much assistance patient has recently required for ambulation/ADL's.  Pt admitted with above diagnosis.  Pt currently with functional limitations due to the deficits listed below (see PT Problem List). Pt will benefit from skilled PT to increase their independence and safety with mobility to allow discharge to the venue listed below.       Follow Up Recommendations SNF;Supervision/Assistance - 24 hour    Equipment Recommendations  None recommended by PT    Recommendations for Other Services       Precautions / Restrictions Precautions Precautions: Fall Precaution Comments: incontinence Restrictions Weight Bearing Restrictions: No      Mobility  Bed Mobility Overal bed mobility: Needs Assistance Bed Mobility: Supine to Sit     Supine to sit: Mod assist     General bed mobility comments: asssit with legs and 1 UE to pull upright from therapists support.    Transfers Overall transfer level: Needs assistance Equipment used: 2 person hand  held assist Transfers: Sit to/from Stand Sit to Stand: +2 safety/equipment         General transfer comment: Two hand holds and mod assist to stand from lower bed. 2 hand holds and tactile cues to ambulate patient to bathroom and perform toilet transfer. Patient needs the tactile cues to move otherwise would stand in place. Patient's legs scissor during turns.  Ambulation/Gait Ambulation/Gait assistance: Mod assist Gait Distance (Feet): 20 Feet (x 2) Assistive device: 2 person hand held assist;IV Pole Gait Pattern/deviations: Step-to pattern;Step-through pattern;Scissoring;Staggering right;Staggering left     General Gait Details: multimodal cues  for mobility. Patient ambulated first with 2 HHA, second walk with 1 HHA and held onto IV pole which was steadier gait.  Stairs            Wheelchair Mobility    Modified Rankin (Stroke Patients Only)       Balance Overall balance assessment: Needs assistance Sitting-balance support: No upper extremity supported;Feet supported Sitting balance-Leahy Scale: Fair     Standing balance support: Bilateral upper extremity supported Standing balance-Leahy Scale: Poor Standing balance comment: requires assistance for balance                             Pertinent Vitals/Pain Pain Assessment: 0-10 Faces Pain Scale: No hurt    Home Living Family/patient expects to be discharged to:: Private residence Living Arrangements: Children Available Help at Discharge: Family;Available 24 hours/day Type of Home: House       Home Layout: One level Home Equipment: Walker - 4 wheels Additional Comments: pt unable to provide history due to cognition. Environment from chart review/prior admission.    Prior Function  Comments: chart indicates patient ambulatory in home     Hand Dominance        Extremity/Trunk Assessment        Lower Extremity Assessment Lower Extremity Assessment: Generalized weakness  (feet/legs in cosntant motion when not standing up.)    Cervical / Trunk Assessment Cervical / Trunk Assessment: Kyphotic  Communication   Communication: Expressive difficulties;Receptive difficulties (says a few words,)  Cognition Arousal/Alertness: Awake/alert Behavior During Therapy: WFL for tasks assessed/performed Overall Cognitive Status: No family/caregiver present to determine baseline cognitive functioning                                 General Comments: patient does follow simple tactile/visual cues when spoken  to directly and firmly      General Comments      Exercises     Assessment/Plan    PT Assessment Patient needs continued PT services  PT Problem List Decreased strength;Decreased mobility;Decreased knowledge of precautions;Decreased activity tolerance;Decreased cognition;Decreased balance       PT Treatment Interventions Therapeutic activities;Cognitive remediation;Gait training;Therapeutic exercise;Patient/family education;Functional mobility training    PT Goals (Current goals can be found in the Care Plan section)  Acute Rehab PT Goals PT Goal Formulation: Patient unable to participate in goal setting Time For Goal Achievement: 05/03/20 Potential to Achieve Goals: Fair    Frequency Min 2X/week   Barriers to discharge        Co-evaluation PT/OT/SLP Co-Evaluation/Treatment: Yes Reason for Co-Treatment: For patient/therapist safety PT goals addressed during session: Mobility/safety with mobility OT goals addressed during session: ADL's and self-care       AM-PAC PT "6 Clicks" Mobility  Outcome Measure Help needed turning from your back to your side while in a flat bed without using bedrails?: A Little Help needed moving from lying on your back to sitting on the side of a flat bed without using bedrails?: A Little Help needed moving to and from a bed to a chair (including a wheelchair)?: A Lot Help needed standing up from a chair  using your arms (e.g., wheelchair or bedside chair)?: A Lot Help needed to walk in hospital room?: A Lot Help needed climbing 3-5 steps with a railing? : Total 6 Click Score: 13    End of Session Equipment Utilized During Treatment: Gait belt Activity Tolerance: Patient tolerated treatment well Patient left: in chair;with call bell/phone within reach;with chair alarm set Nurse Communication: Mobility status PT Visit Diagnosis: Difficulty in walking, not elsewhere classified (R26.2);Muscle weakness (generalized) (M62.81)    Time: 3559-7416 PT Time Calculation (min) (ACUTE ONLY): 25 min   Charges:   PT Evaluation $PT Eval Low Complexity: 1 Low          Blanchard Kelch PT Acute Rehabilitation Services Pager [phone] Office [phone]   Rada Hay 04/19/2020, 1:57 PM

## 2020-04-20 DIAGNOSIS — G9341 Metabolic encephalopathy: Secondary | ICD-10-CM | POA: Diagnosis not present

## 2020-04-20 LAB — CBC
HCT: 35.6 % — ABNORMAL LOW (ref 36.0–46.0)
Hemoglobin: 11.4 g/dL — ABNORMAL LOW (ref 12.0–15.0)
MCH: 31.1 pg (ref 26.0–34.0)
MCHC: 32 g/dL (ref 30.0–36.0)
MCV: 97.3 fL (ref 80.0–100.0)
Platelets: 137 10*3/uL — ABNORMAL LOW (ref 150–400)
RBC: 3.66 MIL/uL — ABNORMAL LOW (ref 3.87–5.11)
RDW: 12.7 % (ref 11.5–15.5)
WBC: 12 10*3/uL — ABNORMAL HIGH (ref 4.0–10.5)
nRBC: 0 % (ref 0.0–0.2)

## 2020-04-20 LAB — BASIC METABOLIC PANEL
Anion gap: 9 (ref 5–15)
BUN: 9 mg/dL (ref 8–23)
CO2: 26 mmol/L (ref 22–32)
Calcium: 8.2 mg/dL — ABNORMAL LOW (ref 8.9–10.3)
Chloride: 102 mmol/L (ref 98–111)
Creatinine, Ser: 0.8 mg/dL (ref 0.44–1.00)
GFR, Estimated: >60 mL/min (ref >60)
Glucose, Bld: 121 mg/dL — ABNORMAL HIGH (ref 70–99)
Potassium: 3.4 mmol/L — ABNORMAL LOW (ref 3.5–5.1)
Sodium: 137 mmol/L (ref 135–145)

## 2020-04-20 LAB — GLUCOSE, CAPILLARY
Glucose-Capillary: 98 mg/dL (ref 70–99)
Glucose-Capillary: 83 mg/dL (ref 70–99)

## 2020-04-20 MED ORDER — POTASSIUM CHLORIDE ER 20 MEQ PO TBCR: 20.0000 meq | EXTENDED_RELEASE_TABLET | Freq: Every day | ORAL | 0 refills | Status: DC

## 2020-04-20 MED ORDER — POTASSIUM CHLORIDE 20 MEQ PO PACK
40.0000 meq | PACK | Freq: Once | ORAL | Status: AC
  Administered 2020-04-20: 40 meq via ORAL
  Filled 2020-04-20: qty 2

## 2020-04-20 MED ORDER — AMLODIPINE BESYLATE 2.5 MG PO TABS
2.5000 mg | ORAL_TABLET | Freq: Every day | ORAL | 0 refills | Status: DC
Start: 2020-04-21 — End: 2020-07-31

## 2020-04-20 MED ORDER — CEFDINIR 300 MG PO CAPS: 300.0000 mg | ORAL_CAPSULE | Freq: Two times a day (BID) | ORAL | 0 refills | Status: AC

## 2020-04-20 NOTE — Progress Notes (Signed)
Physical Therapy Treatment Patient Details Name: FUJIKO PICAZO MRN: [id number] DOB: 25-Oct-1930 Today's Date: 04/20/2020    History of Present Illness Thisi s an 86 year old female with a history of COPD, RA, polymyalgia rheumatica, paroxysmal atrial fibrillation on eliquis, hypothyroidism, hypertension, hyperlipidemia, HFrEF, recurrent polymicrobial UTIs on trimethoprim prophylaxis, dementia who presented to the ED 04/16/20 with fever, lethargic with generalized weakness. Patient lives with daughter and is typically relatively independent at home but daughter helps with some tasks. Patient is essentially nonverbal but does say 'yes/no'    PT Comments    Pt OOB in recliner.  Pleasant.  General Comments: patient does follow simple repeat commands and mostly answers "yes"  "no" Assisted with amb.  General transfer comment: Two hand holds and mod assist to stand from lower bed. 2 hand holds and tactile cues to ambulate patient to bathroom and perform toilet transfer. Patient needs the tactile cues to move otherwise would stand in place. Patient's legs scissor during turns.General Gait Details: required + 1 hand held assist (Hx dementia/PLOF no AD) amb to and from bathroom.  Unsteady.  Poor balance.  weak.  Follow Up Recommendations  SNF or  return home with Assistance - 24 hour (per Case Manager [email or web address] looking to provide 24/7 Aide)     Equipment Recommendations  None recommended by PT    Recommendations for Other Services       Precautions / Restrictions Precautions Precautions: Fall Precaution Comments: incontinence, dementia    Mobility  Bed Mobility               General bed mobility comments: OOB in recliner  Transfers Overall transfer level: Needs assistance Equipment used: 1 person hand held assist Transfers: Sit to/from [employer] Sit to Stand: Min assist Stand pivot transfers: Min assist;Mod assist       General transfer comment: Two hand holds  and mod assist to stand from lower bed. 2 hand holds and tactile cues to ambulate patient to bathroom and perform toilet transfer. Patient needs the tactile cues to move otherwise would stand in place. Patient's legs scissor during turns.  Ambulation/Gait Ambulation/Gait assistance: Min assist;Mod assist Gait Distance (Feet): 22 Feet (to and from bathroom) Assistive device: 1 person hand held assist Gait Pattern/deviations: Step-to pattern;Step-through pattern;Staggering right;Staggering left Gait velocity: decreased   General Gait Details: required + 1 hand held assist (Hx dementia/PLOF no AD) amb to and from bathroom.  Unsteady.  Poor balance.  weak.   Stairs             Wheelchair Mobility    Modified Rankin (Stroke Patients Only)       Balance                                            Cognition   Behavior During Therapy: WFL for tasks assessed/performed;Flat affect Overall Cognitive Status: No family/caregiver present to determine baseline cognitive functioning                                 General Comments: patient does follow simple repeat commands and mostly answers "yes"  "no"      Exercises      General Comments        Pertinent Vitals/Pain      Home Living  Prior Function            PT Goals (current goals can now be found in the care plan section) Progress towards PT goals: Progressing toward goals    Frequency    Min 2X/week      PT Plan Current plan remains appropriate    Co-evaluation              AM-PAC PT "6 Clicks" Mobility   Outcome Measure  Help needed turning from your back to your side while in a flat bed without using bedrails?: A Little Help needed moving from lying on your back to sitting on the side of a flat bed without using bedrails?: A Little Help needed moving to and from a bed to a chair (including a wheelchair)?: A Little Help needed  standing up from a chair using your arms (e.g., wheelchair or bedside chair)?: A Little Help needed to walk in hospital room?: A Little Help needed climbing 3-5 steps with a railing? : A Lot 6 Click Score: 17    End of Session Equipment Utilized During Treatment: Gait belt Activity Tolerance: Patient tolerated treatment well Patient left: in chair;with call bell/phone within reach;with chair alarm set Nurse Communication: Mobility status PT Visit Diagnosis: Difficulty in walking, not elsewhere classified (R26.2);Muscle weakness (generalized) (M62.81)     Time: 2202-5427 PT Time Calculation (min) (ACUTE ONLY): 25 min  Charges:  $Gait Training: 8-22 mins $Therapeutic Activity: 8-22 mins                     Felecia Shelling  PTA Acute  Rehabilitation Services Pager      [phone] Office      [phone]

## 2020-04-20 NOTE — TOC Transition Note (Signed)
Transition of Care [facility]) - CM/SW Discharge Note   Patient Details  Name: QUANIYAH BUGH MRN: [id number] Date of Birth: [birth year]  Transition of Care [facility]) CM/SW Contact:  Ida Rogue, LCSW Phone Number: 04/20/2020, 2:12 PM   Clinical Narrative:  Patient who lives with daughter will transition home today.  I spoke with Ms Powers and went over PT findings, recommendation.  She states she already has caregivers set up that help with both her mother and her brother with advanced MS, and she feels they are equipped to handle her mother's current condition.  She is accepting of [facility] services. Cindie with [facility] agreed to provide [facility] services.  No further needs identified.  Daughter will pick patient up when she is ready. TOC sign off.    Final next level of care: Home w Home Health Services Barriers to Discharge: No Barriers Identified   Patient Goals and CMS Choice        Discharge Placement                       Discharge Plan and Services                                     Social Determinants of Health (SDOH) Interventions     Readmission Risk Interventions No flowsheet data found.

## 2020-04-20 NOTE — Discharge Summary (Signed)
Physician Discharge Summary  LORRAINE CIMMINO XUX:[id number] DOB: [birth year] DOA: 04/16/2020  PCP: Shon Baton, MD  Admit date: 04/16/2020 Discharge date: 04/20/2020  Admitted From: Home Disposition:  Home  Discharge Condition:Stable CODE STATUS: DNR Diet recommendation:  Dysphagia 2  Brief/Interim Summary:  Thisi s an 86 year old female with a history of COPD, RA, polymyalgia rheumatica, paroxysmal atrial fibrillation on eliquis, hypothyroidism, hypertension, hyperlipidemia, HFrEF, recurrent polymicrobial UTIs on trimethoprim prophylaxis, dementia who presented to the ED with fever.  She was suspected to have community-acquired pneumonia and was admitted for further management.  She was started on antibiotics.  Hospital course was stable with improvement in the respiratory status.  There was concern about being more confused at home, currently she is at her baseline.  She was seen by physical therapy and occupational therapist and was recommended skilled nursing facility on discharge but her daughter wants to take her home with home health.  She is medically stable for discharge today to home.  Following problems were addressed during her hospitalization:   1 acute metabolic encephalopathy in the setting of dementia Concern for infectious etiology.  Mental status currently at baseline.  She is confused at baseline.  She is calm and cooperative  2.  Sepsis without organ damage, present on admission. SIRS with likely source from CAP/aspiration pneumonia Patient met criteria for SIRS on admission with fever of 102 at home, leukocytosis, cough, brief hypoxia, metabolic encephalopathy.  Lactic acid level on admission was 5.9 however improved to 1.9.  Patient noted to be on prophylactic trimethoprim that is on hold.  Patient pancultured. Urine cultures with no growth.    Blood cultures have not shown any growth.  COVID-19 PCR negative She was treated with Rocephin and azithromycin ..  .  3.   Episode of hypoxia likely from atelectasis versus CAP Hypoxia noted to have resolved.  Due to dementia likely unable to cooperate with flutter valve and incentive spirometry.    Currently she is on room air  4.  Paroxysmal atrial fibrillation Restarted patient's carvedilol at 12.5 mg twice daily Added amlodipine 2.5 mg daily Eliquis 2.5 mg twice daily continued  5.  Hypertension Continue current medications   6.  Hypothyroidism Continue with levothyroxine  7.  Rheumatoid arthritis/PMR Resume prednisone at home dose   8.  Bronchiectasis Stable.  9. Hypoglycemia Resolved   Discharge Diagnoses:  Principal Problem:   Acute metabolic encephalopathy Active Problems:   Essential hypertension   Polymyalgia rheumatica (HCC)   Current chronic use of systemic steroids   AF (paroxysmal atrial fibrillation) (HCC)   AMS (altered mental status)   Hypothyroidism   Discharge Instructions  Discharge Instructions    Diet general   Complete by: As directed    Dysphagia 2   Discharge instructions   Complete by: As directed    1)Please take prescribed medications as instructed 2)Follow up with your PCP in a week. 3)Follow up with home health   Increase activity slowly   Complete by: As directed      Allergies as of 04/20/2020      Reactions   Sulfonamide Derivatives Rash      Medication List    TAKE these medications   acetaminophen 325 MG tablet Commonly known as: TYLENOL Take 650 mg by mouth every 6 (six) hours as needed for mild pain, fever or headache.   AeroChamber MV inhaler Use as instructed   albuterol 108 (90 Base) MCG/ACT inhaler Commonly known as: VENTOLIN HFA Inhale 1-2 puffs  into the lungs every 6 (six) hours as needed for shortness of breath or wheezing.   amitriptyline 50 MG tablet Commonly known as: ELAVIL Take 50 mg by mouth at bedtime.   amLODipine 2.5 MG tablet Commonly known as: NORVASC Take 1 tablet (2.5 mg total) by mouth  daily. Start taking on: April 21, 2020   carvedilol 12.5 MG tablet Commonly known as: COREG Take 1 tablet (12.5 mg total) by mouth 2 (two) times daily with a meal.   cefdinir 300 MG capsule Commonly known as: OMNICEF Take 1 capsule (300 mg total) by mouth 2 (two) times daily for 2 days. Start taking on: April 21, 2020   Eliquis 2.5 MG Tabs tablet Generic drug: apixaban TAKE 1 TABLET BY MOUTH TWICE A DAY What changed: how much to take   levothyroxine 75 MCG tablet Commonly known as: SYNTHROID Take 1 tablet (75 mcg total) by mouth daily at 6 (six) AM.   memantine 5 MG tablet Commonly known as: NAMENDA Take 10 mg by mouth daily.   omeprazole 20 MG capsule Commonly known as: PRILOSEC Take 20 mg by mouth daily.   Potassium Chloride ER 20 MEQ Tbcr Take 20 mEq by mouth daily for 7 days.   predniSONE 1 MG tablet Commonly known as: DELTASONE Take 3 mg by mouth daily.   trimethoprim 100 MG tablet Commonly known as: TRIMPEX Take 100 mg by mouth daily.   Vitamin D3 25 MCG tablet Commonly known as: Vitamin D Take 1 tablet (1,000 Units total) by mouth daily.   Wixela Inhub 100-50 MCG/DOSE Aepb Generic drug: Fluticasone-Salmeterol Inhale 2 puffs into the lungs daily.       Follow-up Information    [facility] Follow up.   Specialty: [place] Why: This is the home health agency that will be working with you starting next Monday, Tuesday at the latest Contact information: [place] [address] [phone]        Shon Baton, MD. Schedule an appointment as soon as possible for a visit in 1 week(s).   Specialty: Internal Medicine Contact information: [place] [address] [phone]              Allergies  Allergen Reactions  . Sulfonamide Derivatives Rash    Consultations:  None   Procedures/Studies: DG Chest 1 View  Result Date: 04/19/2020 CLINICAL DATA:  Dyspnea on  exertion EXAM: CHEST  1 VIEW COMPARISON:  April 16, 2020. FINDINGS: The heart size and mediastinal contours are within normal limits. Both lungs are clear. No pneumothorax or pleural effusion is noted. The visualized skeletal structures are unremarkable. IMPRESSION: No active disease. Aortic Atherosclerosis (ICD10-I70.0). Electronically Signed   By: Marijo Conception M.D.   On: 04/19/2020 09:55   CT Head Wo Contrast  Result Date: 04/16/2020 CLINICAL DATA:  Altered mental status. EXAM: CT HEAD WITHOUT CONTRAST TECHNIQUE: Contiguous axial images were obtained from the base of the skull through the vertex without intravenous contrast. COMPARISON:  Sep 21, 2019. FINDINGS: Brain: Mild diffuse cortical atrophy is noted. Mild chronic ischemic white matter disease is noted. No mass effect or midline shift is noted. Ventricular size is within normal limits. There is no evidence of mass lesion, hemorrhage or acute infarction. Vascular: No hyperdense vessel or unexpected calcification. Skull: Normal. Negative for fracture or focal lesion. Sinuses/Orbits: No acute finding. Other: None. IMPRESSION: Mild diffuse cortical atrophy. Mild chronic ischemic white matter disease. No acute intracranial abnormality seen. Electronically  Signed   By: Marijo Conception M.D.   On: 04/16/2020 10:23   DG Chest Port 1 View  Result Date: 04/16/2020 CLINICAL DATA:  Sepsis. EXAM: PORTABLE CHEST 1 VIEW COMPARISON:  November 25, 2019. FINDINGS: The heart size and mediastinal contours are within normal limits. No pneumothorax or pleural effusion is noted. Left lung is clear. Minimal right basilar subsegmental atelectasis or scarring is noted. The visualized skeletal structures are unremarkable. IMPRESSION: Minimal right basilar subsegmental atelectasis or scarring. Aortic Atherosclerosis (ICD10-I70.0). Electronically Signed   By: Marijo Conception M.D.   On: 04/16/2020 08:48       Subjective: Patient seen and examined the bedside this  afternoon.  Hemodynamically stable for discharge today  Discharge Exam: Vitals:   04/20/20 0851 04/20/20 1353  BP:  (!) 117/48  Pulse:  80  Resp:  18  Temp:  98.2 F (36.8 C)  SpO2: 94% 93%   Vitals:   04/20/20 0447 04/20/20 0500 04/20/20 0851 04/20/20 1353  BP: (!) 145/62   (!) 117/48  Pulse: 78   80  Resp: 15   18  Temp: 98.8 F (37.1 C)   98.2 F (36.8 C)  TempSrc: Oral   Oral  SpO2: 96%  94% 93%  Weight:  64.4 kg    Height:        General: Pt is alert, awake, not in acute distress Cardiovascular: RRR, S1/S2 +, no rubs, no gallops Respiratory: CTA bilaterally, no wheezing, no rhonchi Abdominal: Soft, NT, ND, bowel sounds + Extremities: no edema, no cyanosis    The results of significant diagnostics from this hospitalization (including imaging, microbiology, ancillary and laboratory) are listed below for reference.     Microbiology: Recent Results (from the past 240 hour(s))  Blood Culture (routine x 2)     Status: None (Preliminary result)   Collection Time: 04/16/20  8:12 AM   Specimen: BLOOD  Result Value Ref Range Status   Specimen Description   Final    BLOOD BLOOD RIGHT FOREARM Performed at [facility] [address], [place], [place] [address]    Special Requests   Final    BOTTLES DRAWN AEROBIC AND ANAEROBIC Blood Culture adequate volume Performed at [facility] [address] [place], [place] [address]    Culture   Final    NO GROWTH 4 DAYS Performed at [facility], [place] [address] [place], [place] [address]    Report Status PENDING  Incomplete  Blood Culture (routine x 2)     Status: None (Preliminary result)   Collection Time: 04/16/20  8:20 AM   Specimen: BLOOD  Result Value Ref Range Status   Specimen Description   Final    BLOOD RIGHT ANTECUBITAL Performed at [facility] [address], [place], [place] [address]    Special Requests   Final    BOTTLES DRAWN AEROBIC  AND ANAEROBIC Blood Culture results may not be optimal due to an inadequate volume of blood received in culture bottles Performed at [facility] [address] [place], [place] [address]    Culture   Final    NO GROWTH 4 DAYS Performed at [facility], [place] [address] [place], [place] [address]    Report Status PENDING  Incomplete  Urine culture     Status: None   Collection Time: 04/16/20  8:23 AM   Specimen: In/Out Cath Urine  Result Value Ref Range Status  Specimen Description   Final    IN/OUT CATH URINE Performed at [facility] [address], [place], [place] [address]    Special Requests   Final    NONE Performed at [facility], [place] [address], [place], [place] [address]    Culture   Final    NO GROWTH Performed at [facility], [place] [address] [place], [place] [address]    Report Status 04/17/2020 FINAL  Final  Resp Panel by RT-PCR (Flu A&B, Covid) Nasopharyngeal Swab     Status: None   Collection Time: 04/16/20 11:14 AM   Specimen: Nasopharyngeal Swab; Nasopharyngeal(NP) swabs in vial transport medium  Result Value Ref Range Status   SARS Coronavirus 2 by RT PCR NEGATIVE NEGATIVE Final    Comment: (NOTE) SARS-CoV-2 target nucleic acids are NOT DETECTED.  The SARS-CoV-2 RNA is generally detectable in upper respiratory specimens during the acute phase of infection. The lowest concentration of SARS-CoV-2 viral copies this assay can detect is 138 copies/mL. A negative result does not preclude SARS-Cov-2 infection and should not be used as the sole basis for treatment or other patient management decisions. A negative result may occur with  improper specimen collection/handling, submission of specimen other than nasopharyngeal swab, presence of viral mutation(s) within the areas targeted by this assay, and inadequate number of viral copies(<138 copies/mL). A negative result must be combined  with clinical observations, patient history, and epidemiological information. The expected result is Negative.  Fact Sheet for Patients:  EntrepreneurPulse.com.au  Fact Sheet for Healthcare Providers:  IncredibleEmployment.be  This test is no t yet approved or cleared by the [place] FDA and  has been authorized for detection and/or diagnosis of SARS-CoV-2 by FDA under an Emergency Use Authorization (EUA). This EUA will remain  in effect (meaning this test can be used) for the duration of the COVID-19 declaration under Section 564(b)(1) of the Act, 21 U.S.C.section 360bbb-3(b)(1), unless the authorization is terminated  or revoked sooner.       Influenza A by PCR NEGATIVE NEGATIVE Final   Influenza B by PCR NEGATIVE NEGATIVE Final    Comment: (NOTE) The Xpert Xpress SARS-CoV-2/FLU/RSV plus assay is intended as an aid in the diagnosis of influenza from Nasopharyngeal swab specimens and should not be used as a sole basis for treatment. Nasal washings and aspirates are unacceptable for Xpert Xpress SARS-CoV-2/FLU/RSV testing.  Fact Sheet for Patients: EntrepreneurPulse.com.au  Fact Sheet for Healthcare Providers: IncredibleEmployment.be  This test is not yet approved or cleared by the [place] FDA and has been authorized for detection and/or diagnosis of SARS-CoV-2 by FDA under an Emergency Use Authorization (EUA). This EUA will remain in effect (meaning this test can be used) for the duration of the COVID-19 declaration under Section 564(b)(1) of the Act, 21 U.S.C. section 360bbb-3(b)(1), unless the authorization is terminated or revoked.  Performed at [facility], [place] [address], [place], [place] [address]   MRSA PCR Screening     Status: None   Collection Time: 04/16/20 11:52 AM   Specimen: Nasal Mucosa; Nasopharyngeal  Result Value Ref Range Status   MRSA by PCR  NEGATIVE NEGATIVE Final    Comment:        The GeneXpert MRSA Assay (FDA approved for NASAL specimens only), is one component of a comprehensive MRSA colonization surveillance program. It is not intended to diagnose MRSA infection nor to guide or monitor treatment for MRSA infections. Performed at [facility],  [place] [address], [place], [place] [address]      Labs: BNP (last 3 results) Recent Labs    09/21/19 1421  BNP 2,778.2*   Basic Metabolic Panel: Recent Labs  Lab 04/16/20 0812 04/17/20 0424 04/18/20 0430 04/19/20 0417 04/20/20 0345  NA 136 136 136 135 137  K 3.9 3.8 3.5 3.3* 3.4*  CL 99 102 100 99 102  CO2 _0 GLUCOSE 117* 81 111* 115* 121*  BUN _1 7* 9  CREATININE 1.01* 0.81 0.82 0.81 0.80  CALCIUM 9.0 8.7* 8.3* 8.4* 8.2*  MG  --   --   --  1.6*  --   PHOS  --   --   --  1.9*  --    Liver Function Tests: Recent Labs  Lab 04/16/20 0812 04/19/20 0417  AST 34 39  ALT 24 28  ALKPHOS 112 129*  BILITOT 0.5 0.6  PROT 7.8 6.4*  ALBUMIN 4.0 3.1*   No results for input(s): LIPASE, AMYLASE in the last 168 hours. Recent Labs  Lab 04/17/20 1033  AMMONIA 20   CBC: Recent Labs  Lab 04/16/20 0812 04/17/20 0424 04/18/20 0430 04/19/20 0417 04/20/20 0345  WBC 14.3* 14.0* 12.6* 12.8* 12.0*  NEUTROABS 11.7*  --   --   --   --   HGB 13.9 13.8 13.1 13.0 11.4*  HCT 43.8 44.1 40.1 40.2 35.6*  MCV 98.2 99.8 95.0 97.8 97.3  PLT 305 228 181 153 137*   Cardiac Enzymes: No results for input(s): CKTOTAL, CKMB, CKMBINDEX, TROPONINI in the last 168 hours. BNP: Invalid input(s): POCBNP CBG: Recent Labs  Lab 04/19/20 1721 04/19/20 2000 04/19/20 2313 04/20/20 0736 04/20/20 1151  GLUCAP 94 131* 114* 98 83   D-Dimer No results for input(s): DDIMER in the last 72 hours. Hgb A1c No results for input(s): HGBA1C in the last 72 hours. Lipid Profile No results for input(s): CHOL, HDL, LDLCALC, TRIG, CHOLHDL, LDLDIRECT in the  last 72 hours. Thyroid function studies No results for input(s): TSH, T4TOTAL, T3FREE, THYROIDAB in the last 72 hours.  Invalid input(s): FREET3 Anemia work up No results for input(s): VITAMINB12, FOLATE, FERRITIN, TIBC, IRON, RETICCTPCT in the last 72 hours. Urinalysis    Component Value Date/Time   COLORURINE YELLOW 04/16/2020 0823   APPEARANCEUR HAZY (A) 04/16/2020 0823   LABSPEC 1.010 04/16/2020 0823   PHURINE 8.0 04/16/2020 0823   GLUCOSEU NEGATIVE 04/16/2020 0823   HGBUR NEGATIVE 04/16/2020 0823   BILIRUBINUR NEGATIVE 04/16/2020 0823   KETONESUR NEGATIVE 04/16/2020 0823   PROTEINUR NEGATIVE 04/16/2020 0823   NITRITE NEGATIVE 04/16/2020 0823   LEUKOCYTESUR NEGATIVE 04/16/2020 0823   Sepsis Labs Invalid input(s): PROCALCITONIN,  WBC,  LACTICIDVEN Microbiology Recent Results (from the past 240 hour(s))  Blood Culture (routine x 2)     Status: None (Preliminary result)   Collection Time: 04/16/20  8:12 AM   Specimen: BLOOD  Result Value Ref Range Status   Specimen Description   Final    BLOOD BLOOD RIGHT FOREARM Performed at [facility], [place] [address] [place], [place] [address]    Special Requests   Final    BOTTLES DRAWN AEROBIC AND ANAEROBIC Blood Culture adequate volume Performed at [facility] [address], [place], [place] [address]    Culture   Final    NO GROWTH 4 DAYS Performed at [facility], [place] [address] [place], [place] [address]    Report Status PENDING  Incomplete  Blood Culture (routine x 2)     Status: None (Preliminary result)   Collection Time: 04/16/20  8:20 AM   Specimen: BLOOD  Result Value Ref Range Status   Specimen Description   Final    BLOOD RIGHT ANTECUBITAL Performed at [facility] [address] [place], [place] [address]    Special Requests   Final    BOTTLES DRAWN AEROBIC AND ANAEROBIC Blood Culture results may not be optimal due to an inadequate volume of  blood received in culture bottles Performed at [facility] [address] [place], [place] [address]    Culture   Final    NO GROWTH 4 DAYS Performed at [facility], [place] [address], [place], [place] [address]    Report Status PENDING  Incomplete  Urine culture     Status: None   Collection Time: 04/16/20  8:23 AM   Specimen: In/Out Cath Urine  Result Value Ref Range Status   Specimen Description   Final    IN/OUT CATH URINE Performed at [facility] [address] [place], [place] [address]    Special Requests   Final    NONE Performed at [facility], [place] [address], [place], [place] [address]    Culture   Final    NO GROWTH Performed at [facility], [place] [address], [place], [place] [address]    Report Status 04/17/2020 FINAL  Final  Resp Panel by RT-PCR (Flu A&B, Covid) Nasopharyngeal Swab     Status: None   Collection Time: 04/16/20 11:14 AM   Specimen: Nasopharyngeal Swab; Nasopharyngeal(NP) swabs in vial transport medium  Result Value Ref Range Status   SARS Coronavirus 2 by RT PCR NEGATIVE NEGATIVE Final    Comment: (NOTE) SARS-CoV-2 target nucleic acids are NOT DETECTED.  The SARS-CoV-2 RNA is generally detectable in upper respiratory specimens during the acute phase of infection. The lowest concentration of SARS-CoV-2 viral copies this assay can detect is 138 copies/mL. A negative result does not preclude SARS-Cov-2 infection and should not be used as the sole basis for treatment or other patient management decisions. A negative result may occur with  improper specimen collection/handling, submission of specimen other than nasopharyngeal swab, presence of viral mutation(s) within the areas targeted by this assay, and inadequate number of viral copies(<138 copies/mL). A negative result must be combined with clinical observations, patient history, and epidemiological information. The expected  result is Negative.  Fact Sheet for Patients:  EntrepreneurPulse.com.au  Fact Sheet for Healthcare Providers:  IncredibleEmployment.be  This test is no t yet approved or cleared by the [place] FDA and  has been authorized for detection and/or diagnosis of SARS-CoV-2 by FDA under an Emergency Use Authorization (EUA). This EUA will remain  in effect (meaning this test can be used) for the duration of the COVID-19 declaration under Section 564(b)(1) of the Act, 21 U.S.C.section 360bbb-3(b)(1), unless the authorization is terminated  or revoked sooner.       Influenza A by PCR NEGATIVE NEGATIVE Final   Influenza B by PCR NEGATIVE NEGATIVE Final    Comment: (NOTE) The Xpert Xpress SARS-CoV-2/FLU/RSV plus assay is intended as an aid in the diagnosis of influenza from Nasopharyngeal swab specimens and should not be used as a sole basis for treatment. Nasal washings and aspirates are unacceptable for Xpert Xpress SARS-CoV-2/FLU/RSV testing.  Fact Sheet for Patients: EntrepreneurPulse.com.au  Fact Sheet for Healthcare Providers: IncredibleEmployment.be  This  test is not yet approved or cleared by the [place] and has been authorized for detection and/or diagnosis of SARS-CoV-2 by FDA under an Emergency Use Authorization (EUA). This EUA will remain in effect (meaning this test can be used) for the duration of the COVID-19 declaration under Section 564(b)(1) of the Act, 21 U.S.C. section 360bbb-3(b)(1), unless the authorization is terminated or revoked.  Performed at [facility], [place] [address] [place], [place] [address]   MRSA PCR Screening     Status: None   Collection Time: 04/16/20 11:52 AM   Specimen: Nasal Mucosa; Nasopharyngeal  Result Value Ref Range Status   MRSA by PCR NEGATIVE NEGATIVE Final    Comment:        The GeneXpert MRSA Assay (FDA approved for NASAL  specimens only), is one component of a comprehensive MRSA colonization surveillance program. It is not intended to diagnose MRSA infection nor to guide or monitor treatment for MRSA infections. Performed at [facility], [place] [address] [place], [place] [address]     Please note: You were cared for by a hospitalist during your hospital stay. Once you are discharged, your primary care physician will handle any further medical issues. Please note that NO REFILLS for any discharge medications will be authorized once you are discharged, as it is imperative that you return to your primary care physician (or establish a relationship with a primary care physician if you do not have one) for your post hospital discharge needs so that they can reassess your need for medications and monitor your lab values.    Time coordinating discharge: 40 minutes  SIGNED:   Shelly Coss, MD  [facility] 04/20/2020, 2:35 PM Pager [phone]  If 7PM-7AM, please contact night-coverage [email or web address] Password [email or web address]

## 2020-04-21 LAB — CULTURE, BLOOD (ROUTINE X 2)
Culture: NO GROWTH
Culture: NO GROWTH
Special Requests: ADEQUATE

## 2020-05-28 ENCOUNTER — Ambulatory Visit: Payer: Medicare Other | Admitting: Pulmonary Disease

## 2020-05-30 ENCOUNTER — Ambulatory Visit: Payer: Medicare HMO | Admitting: Pulmonary Disease

## 2020-05-30 ENCOUNTER — Other Ambulatory Visit: Payer: Self-pay

## 2020-05-30 ENCOUNTER — Encounter: Payer: Self-pay | Admitting: Pulmonary Disease

## 2020-05-30 VITALS — BP 108/64 | HR 58 | Ht 64.0 in | Wt 115.8 lb

## 2020-05-30 DIAGNOSIS — J471 Bronchiectasis with (acute) exacerbation: Secondary | ICD-10-CM | POA: Diagnosis not present

## 2020-05-30 NOTE — Patient Instructions (Signed)
Follow up in 6 months 

## 2020-05-30 NOTE — Progress Notes (Signed)
[facility]  Chief Complaint  Patient presents with  . Follow-up    Pt's daughter states that pt has been doing pretty good since last visit. Pt will sometimes run a fever which will make her more confused. Pt has been in and out of the hospital several times recently.    Constitutional:  BP 108/64 (BP Location: Left Arm, Cuff Size: Normal)   Pulse (!) 58   Ht 5\' 4"  (1.626 m)   Wt 115 lb 12.8 oz (52.5 kg)   SpO2 99%   BMI 19.88 kg/m   Past Medical History:  DJD, Eczema, PUD, GERD, HH, HLD, HTN, Hypothyroidism, OA  Past Surgical History:  She  has a past surgical history that includes Total abdominal hysterectomy; Breast cyst excision; Rhinoplasty; broken tail bone; and Carotid endarterectomy.  Brief Summary:  Jenna Carrillo is a 87 y.o. female former smoker with COPD, and bronchiectasis with history of polymyalgia rheumatica.       Subjective:   She is here with her daughter.  I last saw her in 2019.  She was treated for pneumonia in 2020.  She was been in and out of hospital several times.  Now on chronic antibiotics for recurrent UTIs.  She is living with her daughter.  Has dementia, but still able to keep up with most ADLs.  Sleep is an issue, but maintaining her appetite.  Wasn't able to afford spiriva.  Switched to wixela.  This has been working.  Hasn't needed to use albuterol much recently.  Physical Exam:   Appearance - thin  ENMT - no sinus tenderness, no oral exudate, no LAN, Mallampati 3 airway, no stridor, wears dentures  Respiratory - equal breath sounds bilaterally, no wheezing or rales  CV - s1s2 regular rate and rhythm, no murmurs  Ext - no clubbing, no edema  Skin - no rashes  Psych - normal mood and affect   Pulmonary testing:   PFT 02/02/09 >> FEV1 0.99(53%), FVC 1.58(58%), FEV1% 63, TLC 3.79(79%), DLCO 51%, +BD  Spirometry 01/09/11 >> FEV1 0.97 (49%), FEV1% 67  Chest Imaging:   CT chest  11/27/15 >> scattered nodules, moderate cylindrical/varicoid BTX Rt > Lt lung, patchy tree in bud  Cardiac Tests:   Echo 09/23/19 >> EF 50 to 55%, grade 2 DD, mod elevation in PASP  Social History:  She  reports that she quit smoking about 62 years ago. Her smoking use included cigarettes. She has a 4.00 pack-year smoking history. She has never used smokeless tobacco. She reports that she does not drink alcohol and does not use drugs.  Family History:  Her family history includes Breast cancer in her mother; COPD in her mother; Emphysema in her father and mother; Heart attack in her father; Liver disease in her son.     Assessment/Plan:   COPD with Bronchiectasis. - continue wixela - prn albuterol; discussed when she should use this - prn mucinex  Dementia. - f/u with PCP  Polymyalgia rheumatica. - remains on 3 mg prednisone daily through her PCP  Time Spent Involved in Patient Care on Day of Examination:  21 minutes  Follow up:  Patient Instructions  Follow up in 6 months   Medication List:   Allergies as of 05/30/2020      Reactions   Sulfonamide Derivatives Rash      Medication List       Accurate as of May 30, 2020 10:53 AM. If you have any questions,  ask your nurse or doctor.        STOP taking these medications   Potassium Chloride ER 20 MEQ Tbcr Stopped by: [name], MD     TAKE these medications   acetaminophen 325 MG tablet Commonly known as: TYLENOL Take 650 mg by mouth every 6 (six) hours as needed for mild pain, fever or headache.   AeroChamber MV inhaler Use as instructed   albuterol 108 (90 Base) MCG/ACT inhaler Commonly known as: VENTOLIN HFA Inhale 1-2 puffs into the lungs every 6 (six) hours as needed for shortness of breath or wheezing.   amitriptyline 50 MG tablet Commonly known as: ELAVIL Take 50 mg by mouth at bedtime.   amLODipine 2.5 MG tablet Commonly known as: NORVASC Take 1 tablet (2.5 mg total) by mouth daily.    carvedilol 12.5 MG tablet Commonly known as: COREG Take 1 tablet (12.5 mg total) by mouth 2 (two) times daily with a meal.   Eliquis 2.5 MG Tabs tablet Generic drug: apixaban TAKE 1 TABLET BY MOUTH TWICE A DAY What changed: how much to take   levothyroxine 75 MCG tablet Commonly known as: SYNTHROID Take 1 tablet (75 mcg total) by mouth daily at 6 (six) AM.   memantine 10 MG tablet Commonly known as: NAMENDA Take 10 mg by mouth at bedtime.   omeprazole 20 MG capsule Commonly known as: PRILOSEC Take 20 mg by mouth daily.   predniSONE 1 MG tablet Commonly known as: DELTASONE Take 3 mg by mouth daily.   trimethoprim 100 MG tablet Commonly known as: TRIMPEX Take 100 mg by mouth daily.   Vitamin D3 25 MCG tablet Commonly known as: Vitamin D Take 1 tablet (1,000 Units total) by mouth daily.   Wixela Inhub 100-50 MCG/DOSE Aepb Generic drug: Fluticasone-Salmeterol Inhale 2 puffs into the lungs daily.       Signature:  [name], MD [facility] Pulmonary/Critical Care Pager - [phone] 05/30/2020, 10:53 AM

## 2020-06-25 DIAGNOSIS — I2510 Atherosclerotic heart disease of native coronary artery without angina pectoris: Secondary | ICD-10-CM | POA: Diagnosis not present

## 2020-06-25 DIAGNOSIS — Z0000 Encounter for general adult medical examination without abnormal findings: Secondary | ICD-10-CM | POA: Diagnosis not present

## 2020-06-25 DIAGNOSIS — I5022 Chronic systolic (congestive) heart failure: Secondary | ICD-10-CM | POA: Diagnosis not present

## 2020-06-25 DIAGNOSIS — R627 Adult failure to thrive: Secondary | ICD-10-CM | POA: Diagnosis not present

## 2020-06-25 DIAGNOSIS — I480 Paroxysmal atrial fibrillation: Secondary | ICD-10-CM | POA: Diagnosis not present

## 2020-06-25 DIAGNOSIS — E559 Vitamin D deficiency, unspecified: Secondary | ICD-10-CM | POA: Diagnosis not present

## 2020-06-25 DIAGNOSIS — F0391 Unspecified dementia with behavioral disturbance: Secondary | ICD-10-CM | POA: Diagnosis not present

## 2020-06-25 DIAGNOSIS — Z7901 Long term (current) use of anticoagulants: Secondary | ICD-10-CM | POA: Diagnosis not present

## 2020-06-25 DIAGNOSIS — I110 Hypertensive heart disease with heart failure: Secondary | ICD-10-CM | POA: Diagnosis not present

## 2020-06-25 DIAGNOSIS — E785 Hyperlipidemia, unspecified: Secondary | ICD-10-CM | POA: Diagnosis not present

## 2020-06-25 DIAGNOSIS — E039 Hypothyroidism, unspecified: Secondary | ICD-10-CM | POA: Diagnosis not present

## 2020-06-25 DIAGNOSIS — J449 Chronic obstructive pulmonary disease, unspecified: Secondary | ICD-10-CM | POA: Diagnosis not present

## 2020-06-25 DIAGNOSIS — J479 Bronchiectasis, uncomplicated: Secondary | ICD-10-CM | POA: Diagnosis not present

## 2020-07-01 DIAGNOSIS — Z20822 Contact with and (suspected) exposure to covid-19: Secondary | ICD-10-CM | POA: Diagnosis not present

## 2020-07-11 ENCOUNTER — Ambulatory Visit: Payer: Medicare HMO | Admitting: Cardiovascular Disease

## 2020-07-30 NOTE — Progress Notes (Signed)
Cardiology Office Note:   Date:  07/31/2020  NAME:  Jenna Carrillo    MRN: [id number] DOB:  1930-05-26   PCP:  Creola Corn, MD  Cardiologist:  Reatha Harps, MD   Referring MD: Creola Corn, MD   Chief Complaint  Patient presents with  . Congestive Heart Failure   History of Present Illness:   Jenna Carrillo is a 87 y.o. female with a hx of stress-induced CM, pAF, dementia, COPD who presents for follow-up. Numerous admissions over the recent years.  She presents with her daughter today.  She has had numerous admissions over the last year for UTI and other issues.  Heart function is remained stable.  She does have progressively worsening dementia.  They are looking into a memory care unit.  She is still living at home.  She is on Eliquis but having considerable bruising and bleeding in the lower extremities.  She is on chronic prednisone due to polymyalgia rheumatica which clearly makes her bleeding issues a bit worse.  Given her advanced dementia and remote history of atrial fibrillation we discussed stopping her Eliquis.  They are okay to do this.  BP also very stable today in office.  She was on carvedilol and an ACE inhibitor in the past due to a Takotsubo cardiomyopathy.  Her EF did recover and we stopped aggressive medical therapy.  I think her blood pressure is a bit low.  She is eating but her BMI is 19.  We discussed scaling back medications.  The daughter is in agreement with this.  Unclear how much of a benefit she will get from aggressive medical therapy at this point in her life.  Surprisingly she appears to be doing well.  She is without any complaints.  Problem List 1. Systolic HF -01/2019 40-45% with WMA (stress induced) -04/2019 60-65%, no WMA -09/2019 50-55%, WMA 2. Paroxysmal Afib -CHADSVASC=4 (age, female, HTN) 3. COPD 4. HTN 5. Dementia   Past Medical History: Past Medical History:  Diagnosis Date  . Bronchiectasis   . COPD (chronic obstructive pulmonary disease)  (HCC)   . Degenerative disk disease   . Eczema   . Gastric ulcer   . GERD (gastroesophageal reflux disease)   . Hiatal hernia   . Hyperlipidemia   . Hypertension   . Hypothyroidism   . Osteoarthritis   . Polymyalgia rheumatica (HCC)   . Pulmonary nodule, right   . Vertebrobasilar insufficiency     Past Surgical History: Past Surgical History:  Procedure Laterality Date  . BREAST CYST EXCISION     right  . broken tail bone    . CAROTID ENDARTERECTOMY     right  . RHINOPLASTY    . TOTAL ABDOMINAL HYSTERECTOMY      Current Medications: Current Meds  Medication Sig  . amitriptyline (ELAVIL) 50 MG tablet Take 50 mg by mouth at bedtime.  . carvedilol (COREG) 12.5 MG tablet Take 1 tablet (12.5 mg total) by mouth 2 (two) times daily with a meal.  . cholecalciferol (VITAMIN D) 25 MCG tablet Take 1 tablet (1,000 Units total) by mouth daily.  Marland Kitchen levothyroxine (SYNTHROID) 75 MCG tablet Take 1 tablet (75 mcg total) by mouth daily at 6 (six) AM.  . memantine (NAMENDA) 10 MG tablet Take 1 tablet by mouth 2 (two) times daily.  Marland Kitchen omeprazole (PRILOSEC) 20 MG capsule Take 20 mg by mouth daily.  . predniSONE (DELTASONE) 1 MG tablet Take 3 mg by mouth daily.   . sertraline (ZOLOFT)  50 MG tablet Take 50 mg by mouth daily. 1 Tablet Daily  . trimethoprim (TRIMPEX) 100 MG tablet Take 100 mg by mouth daily.  Monte Fantasia INHUB 100-50 MCG/DOSE AEPB Inhale 2 puffs into the lungs daily.   . [DISCONTINUED] amLODipine (NORVASC) 2.5 MG tablet Take 1 tablet (2.5 mg total) by mouth daily.  . [DISCONTINUED] apixaban (ELIQUIS) 2.5 MG TABS tablet Take 2.5 mg by mouth 2 (two) times daily. 1 Tablet Twice Daily     Allergies:    Sulfonamide derivatives   Social History: Social History   Socioeconomic History  . Marital status: Widowed    Spouse name: Not on file  . Number of children: Not on file  . Years of education: Not on file  . Highest education level: Not on file  Occupational History  .  Occupation: Retired    Comment: staining glass  Tobacco Use  . Smoking status: Former Smoker    Packs/day: 2.00    Years: 2.00    Pack years: 4.00    Types: Cigarettes    Quit date: 05/19/1958    Years since quitting: 62.2  . Smokeless tobacco: Never Used  Vaping Use  . Vaping Use: Never used  Substance and Sexual Activity  . Alcohol use: No  . Drug use: Never  . Sexual activity: Not on file  Other Topics Concern  . Not on file  Social History Narrative   Patient lives in [place], with her son who is bedridden from multiple sclerosis.   Daughter helps in the care and is planning to move both of them in with her, once everyone is stable.   Social Determinants of Health   Financial Resource Strain: Not on file  Food Insecurity: Not on file  Transportation Needs: Not on file  Physical Activity: Not on file  Stress: Not on file  Social Connections: Not on file     Family History: The patient's family history includes Breast cancer in her mother; COPD in her mother; Emphysema in her father and mother; Heart attack in her father; Liver disease in her son.  ROS:   All other ROS reviewed and negative. Pertinent positives noted in the HPI.     EKGs/Labs/Other Studies Reviewed:   The following studies were personally reviewed by me today:  TTE 09/23/2019 1. Left ventricular ejection fraction, by estimation, is 50 to 55%. The  left ventricle has low normal function. The left ventricle demonstrates  regional wall motion abnormalities (see scoring diagram/findings for  description). Left ventricular diastolic  parameters are consistent with Grade II diastolic dysfunction  (pseudonormalization). There is mild hypokinesis of the left ventricular,  basal-mid anteroseptal wall and inferoseptal wall.  2. Right ventricular systolic function is normal. The right ventricular  size is normal. There is moderately elevated pulmonary artery systolic  pressure.  3. Left atrial size was  mildly dilated.  4. The mitral valve is degenerative. Mild mitral valve regurgitation. No  evidence of mitral stenosis.  5. The aortic valve is tricuspid. Aortic valve regurgitation is mild.  Mild aortic valve sclerosis is present, with no evidence of aortic valve  stenosis.  6. The inferior vena cava is normal in size with greater than 50%  respiratory variability, suggesting right atrial pressure of 3 mmHg.   Recent Labs: 09/21/2019: B Natriuretic Peptide 1,475.7; TSH 2.439 04/19/2020: ALT 28; Magnesium 1.6 04/20/2020: BUN 9; Creatinine, Ser 0.80; Hemoglobin 11.4; Platelets 137; Potassium 3.4; Sodium 137   Recent Lipid Panel No results found for: CHOL,  TRIG, HDL, CHOLHDL, VLDL, LDLCALC, LDLDIRECT  Physical Exam:   VS:  BP 128/70   Pulse 71   Ht  (1.6 m)   Wt 112 lb 9.6 oz (51.1 kg)   SpO2 98%   BMI 19.95 kg/m    Wt Readings from Last 3 Encounters:  07/31/20 112 lb 9.6 oz (51.1 kg)  05/30/20 115 lb 12.8 oz (52.5 kg)  04/20/20 141 lb 15.6 oz (64.4 kg)    General: Well nourished, well developed, in no acute distress Head: Atraumatic, normal size  Eyes: PEERLA, EOMI  Neck: Supple, no JVD Endocrine: No thryomegaly Cardiac: Normal S1, S2; RRR; no murmurs, rubs, or gallops Lungs: Clear to auscultation bilaterally, no wheezing, rhonchi or rales  Abd: Soft, nontender, no hepatomegaly  Ext: No edema, pulses 2+ Musculoskeletal: No deformities, BUE and BLE strength normal and equal Skin: Warm and dry, no rashes   Neuro: Alert and oriented to person, place, time, and situation, CNII-XII grossly intact, no focal deficits  Psych: Normal mood and affect   ASSESSMENT:   Jenna Carrillo is a 87 y.o. female who presents for the following: 1. Takotsubo cardiomyopathy   2. Chronic systolic heart failure (HCC)   3. Paroxysmal atrial fibrillation (HCC)   4. Essential hypertension     PLAN:   1. Takotsubo cardiomyopathy -She was diagnosed with a stress-induced cardiomyopathy in  September 2020.  This was related to sepsis.  EF has improved.  She has advanced dementia.  I have not recommended aggressive medical care.  She was on an ACE inhibitor due to this condition but since EF improved we stopped it.  BP is a bit low.  Stop amlodipine.  We will continue Coreg for now.  If her blood pressure continues to get lower she may stop this medication as well.  I did discuss with the daughter that aggressive cardiovascular care is not indicated in the setting of advanced dementia.  She is in agreement.  2. Chronic systolic heart failure (HCC) -Recovery of EF as above.  3. Paroxysmal atrial fibrillation (HCC) -She also was diagnosed with A. fib in the setting of sepsis.  No further recurrence.  She is having significant bruising and bleeding.  Due to her advanced dementia I really do not see benefit of aggressive medical care.  We will stop her Eliquis.  The daughter is in agreement with this.  We did have a shared decision making about the risks and benefits of discontinuation of anticoagulation.  At this time they wish to stop.  4. Essential hypertension -Stop amlodipine.  Continue Coreg.  Again, all of her cardiovascular medications are debatable.  I have not recommended aggressive medical care given her advanced dementia.  Disposition: Return in about 1 year (around 07/31/2021).  Medication Adjustments/Labs and Tests Ordered: Current medicines are reviewed at length with the patient today.  Concerns regarding medicines are outlined above.  No orders of the defined types were placed in this encounter.  No orders of the defined types were placed in this encounter.   Patient Instructions  Medication Instructions:  Stop Amlodipine  Stop Eliquis   *If you need a refill on your cardiac medications before your next appointment, please call your pharmacy*   Follow-Up: At [facility], you and your health needs are our priority.  As part of our continuing mission to provide  you with exceptional heart care, we have created designated Provider Care Teams.  These Care Teams include your primary Cardiologist (physician) and Advanced Practice  Providers (APPs -  Physician Assistants and Nurse Practitioners) who all work together to provide you with the care you need, when you need it.  We recommend signing up for the patient portal called "MyChart".  Sign up information is provided on this After Visit Summary.  MyChart is used to connect with patients for Virtual Visits (Telemedicine).  Patients are able to view lab/test results, encounter notes, upcoming appointments, etc.  Non-urgent messages can be sent to your provider as well.   To learn more about what you can do with MyChart, go to [email or web address].    Your next appointment:   12 month(s)  The format for your next appointment:   In Person  Provider:   Lennie Odor, MD       Time Spent with Patient: I have spent a total of 25 minutes with patient reviewing hospital notes, telemetry, EKGs, labs and examining the patient as well as establishing an assessment and plan that was discussed with the patient.  > 50% of time was spent in direct patient care.  Signed, Lenna Gilford. Flora Lipps, MD, [facility]  [facility]  [address] [place], [place] [address] [phone]  07/31/2020 12:46 PM

## 2020-07-31 ENCOUNTER — Other Ambulatory Visit: Payer: Self-pay

## 2020-07-31 ENCOUNTER — Ambulatory Visit: Payer: Medicare HMO | Admitting: Cardiovascular Disease

## 2020-07-31 ENCOUNTER — Encounter: Payer: Self-pay | Admitting: Cardiovascular Disease

## 2020-07-31 VITALS — BP 128/70 | HR 71 | Ht 63.0 in | Wt 112.6 lb

## 2020-07-31 DIAGNOSIS — I5022 Chronic systolic (congestive) heart failure: Secondary | ICD-10-CM

## 2020-07-31 DIAGNOSIS — I480 Paroxysmal atrial fibrillation: Secondary | ICD-10-CM | POA: Diagnosis not present

## 2020-07-31 DIAGNOSIS — I5181 Takotsubo syndrome: Secondary | ICD-10-CM

## 2020-07-31 DIAGNOSIS — I10 Essential (primary) hypertension: Secondary | ICD-10-CM

## 2020-07-31 NOTE — Patient Instructions (Signed)
Medication Instructions:  Stop Amlodipine  Stop Eliquis   *If you need a refill on your cardiac medications before your next appointment, please call your pharmacy*   Follow-Up: At [facility], you and your health needs are our priority.  As part of our continuing mission to provide you with exceptional heart care, we have created designated Provider Care Teams.  These Care Teams include your primary Cardiologist (physician) and Advanced Practice Providers (APPs -  Physician Assistants and Nurse Practitioners) who all work together to provide you with the care you need, when you need it.  We recommend signing up for the patient portal called "MyChart".  Sign up information is provided on this After Visit Summary.  MyChart is used to connect with patients for Virtual Visits (Telemedicine).  Patients are able to view lab/test results, encounter notes, upcoming appointments, etc.  Non-urgent messages can be sent to your provider as well.   To learn more about what you can do with MyChart, go to [email or web address].    Your next appointment:   12 month(s)  The format for your next appointment:   In Person  Provider:   Lennie Odor, MD

## 2020-09-24 ENCOUNTER — Other Ambulatory Visit: Payer: Self-pay

## 2020-09-24 DIAGNOSIS — F0391 Unspecified dementia with behavioral disturbance: Secondary | ICD-10-CM

## 2020-09-25 ENCOUNTER — Telehealth: Payer: Self-pay

## 2020-09-25 ENCOUNTER — Other Ambulatory Visit: Payer: Self-pay

## 2020-09-25 NOTE — Telephone Encounter (Signed)
   Telephone encounter was:  Unsuccessful.  09/25/2020 Name: Jenna Carrillo MRN: [id number] DOB: Mar 22, 1931  Unsuccessful outbound call made today to assist with:  demential resources  Outreach Attempt:  1st Attempt  A HIPAA compliant voice message was left requesting a return call.  Instructed patient to call back at [phone].  Chelsea Nusz, AAS Paralegal, [facility] Care Guide . Embedded Care Coordination [facility]  Care Management  [address] [place], [place] [address] [email or web address]  ?? [phone]   [email or web address]

## 2020-09-25 NOTE — Patient Outreach (Signed)
[facility]) Care Management  09/25/2020  Jenna Carrillo August 06, 1930 [id number]   Telephone Screen  Referral Date: 09/25/2020 Referral Source:Nurse Call Center Referral Reason: Patient daughter [name] is looking for assistance with getting her mother into a memory care unit.  Insurance: [employer]   Outreach attempt # 1 to patient/caregiver. No answer. RN CM left HIPAA compliant voicemail message along with contact info.     Plan: RN CM will make outreach attempt to patient within 3-4 business days. RN CM will send unsuccessful outreach letter to patient.   Antionette Fairy, RN,BSN,CCM [facility] Care Management Telephonic Care Management Coordinator Direct Phone: [phone] Toll Free: [phone] Fax: [phone]

## 2020-09-26 ENCOUNTER — Other Ambulatory Visit: Payer: Self-pay

## 2020-09-26 NOTE — Patient Outreach (Signed)
[facility]) Care Management  09/26/2020  ZALIA HAUTALA [birth year] [id number]    Telephone Screen  Referral Date: 09/25/2020 Referral Source:Nurse Call Center Referral Reason: Patient daughter [name] is looking for assistance with getting her mother into a memory care unit.  Insurance: [employer] Initial Assessment  Outreach attempt # 2 to patient/caregiver. Spoke with daughter-[name] who handles patient's affairs. Discussed referral. She reports that patient is having worsening/progressive dementia. She currently lives with the patient. She is requiring assistance with ADLs/IADLs. No recent falls. Family has toured and researched some facilities. They are interested in [facility] and [facility] as possible options. Daughter also states that patient was recently approved for Medicaid but they are awaiting for her to receive her card. She is agreeable to SW referral for assistance it placement. Patient has not seen PCP in the a few months. Caregiver is aware that MD will need to complete a face to face visit in order to complete neccessary paperwork.   Conditions: Per chart review, patient has PMH that includes but not limited to dementia, glaucoma,, macular degeneration, HTN,A-fib and COPD. caregiver states that patient was diagnosed with dementia about 1.5 yrs ago . She has noticed that weekly patient's condition is declining. She is unable to carry on meaningful conversation. She requires assistance with all tasks.    Medications Reviewed Today    Reviewed by Charlyn Minerva, RN (Registered Nurse) on 09/26/20 at 1520  Med List Status: <None>  Medication Order Taking? Sig Documenting Provider Last Dose Status Informant  amitriptyline (ELAVIL) 50 MG tablet [id number] No Take 50 mg by mouth at bedtime. [provider] Taking Active Child  carvedilol (COREG) 12.5 MG tablet [id number] No Take 1 tablet (12.5 mg total) by mouth 2 (two) times  daily with a meal. O'Neal, Ronnald Ramp, MD Taking Active Child  cholecalciferol (VITAMIN D) 25 MCG tablet [id number] No Take 1 tablet (1,000 Units total) by mouth daily. Almon Hercules, MD Taking Active Child  levothyroxine (SYNTHROID) 75 MCG tablet [id number] No Take 1 tablet (75 mcg total) by mouth daily at 6 (six) AM. Rolly Salter, MD Taking Active Child  memantine [facility]) 10 MG tablet [id number] No Take 1 tablet by mouth 2 (two) times daily. [provider] Taking Active   omeprazole (PRILOSEC) 20 MG capsule [id number] No Take 20 mg by mouth daily. [provider] Taking Active Child           Med Note Para March, MACI D   Thu Sep 01, 2019  6:51 PM)    predniSONE (DELTASONE) 1 MG tablet [id number] No Take 3 mg by mouth daily.  [provider] Taking Active Child  sertraline (ZOLOFT) 50 MG tablet [id number] No Take 50 mg by mouth daily. 1 Tablet Daily [provider] Taking Active   trimethoprim (TRIMPEX) 100 MG tablet [id number] No Take 100 mg by mouth daily. [provider] Taking Active Child  WIXELA INHUB 100-50 MCG/DOSE AEPB [id number] No Inhale 2 puffs into the lungs daily.  [provider] Taking Active Child          Fall Risk  09/26/2020  Falls in the past year? 0  Number falls in past yr: 0  Injury with Fall? 0  Risk for fall due to : Impaired vision;Medication side effect;Impaired balance/gait  Follow up Education provided;Falls evaluation completed   Depression screen [facility] 2/9 09/26/2020  Decreased Interest 0  Down, Depressed, Hopeless 0  PHQ - 2 Score 0   SDOH  Screenings   Alcohol Screen: Not on file  Depression (PHQ2-9): Low Risk   . PHQ-2 Score: 0  Financial Resource Strain: Not on file  Food Insecurity: No Food Insecurity  . Worried About Programme researcher, broadcasting/film/video in the Last Year: Never true  . Ran Out of Food in the Last Year: Never true  Housing: Not on file  Physical Activity: Not on file  Social Connections: Not on  file  Stress: Not on file  Tobacco Use: Medium Risk  . Smoking Tobacco Use: Former Smoker  . Smokeless Tobacco Use: Never Used  Transportation Needs: No Transportation Needs  . Lack of Transportation (Medical): No  . Lack of Transportation (Non-Medical): No   Goals Addressed              This Visit's Progress   .  (THN)Planning for Long-Term Care-Dementia (pt-stated)        Timeframe:  Long-Range Goal Priority:  High Start Date:   09/26/20                          Expected End Date:  01/15/2021                     Follow Up Date June 2022    Barriers: Knowledge   - check out other places when staying at home is no longer possible (assisted living center, nursing home) - check out services like in-home help or adult day care - look at health insurance policy and other saving accounts so you know how much money you have - make a list of people who can help and what they can do    Why is this important?    Learning that you or your loved one has dementia can be scary and stressful.   You can reduce stress by planning.   Preparing for the future is one of the most important things to do.   Thinking about how much care you/your loved one will need and how much it will cost is not easy.   Early on, you/your loved one can be part of making decisions for the future.   Making sure that your/your loved one's wishes for care are known is important.     Notes:  09/26/20-Daughter reports that they are interested in SNF placement for patient. She has toured and looked at some facilities already. She would like to begin process-referred to SW for assistance with placement.    .  (THN)Prevent Falls-Dementia (pt-stated)        Timeframe:  Long-Range Goal Priority:  High Start Date:  09/26/20                           Expected End Date:  01/15/2021                     Follow Up Date June 2022    Barriers: Knowledge   - always wear shoes or slippers with non-slip sole - make an  emergency alert plan in case I fall - use a cane or walker    Why is this important?    There may be trouble with balance and getting around. Falls can happen.    Notes:  09/26/20-Caregiver reports no recent falls. Patient is "whobbly" at times. She has walker but only uses prn-primarily outside of the home.       Consent:THN services reviewed  and discussed with caregiver. Verbal consent for services given.   Plan: RN CM discussed with caregiver next outreach within the month of June. Caregiver gave verbal consent and in agreement with RN CM follow up and timeframe. Caregiver aware that they may contact RN CM sooner for any issues or concerns. RN CM reviewed goals and plan of care with caregiver. Caregiver agrees to care plan and follow up. RN CM will send barriers letter and route encounter to PCP. RN CM will send welcome letter to patient. RN CM placed referral to [facility] SW for assistance with placement.   Antionette Fairy, RN,BSN,CCM [facility] Care Management Telephonic Care Management Coordinator Direct Phone: [phone] Toll Free: [phone] Fax: [phone]

## 2020-09-27 ENCOUNTER — Telehealth: Payer: Self-pay

## 2020-09-27 NOTE — Telephone Encounter (Signed)
   Telephone encounter was:  Successful.  09/27/2020 Name: Jenna Carrillo MRN: [id number] DOB: 1930/08/10  Jenna Carrillo is a 87 y.o. year old female who is a primary care patient of Creola Corn, MD . The community resource team was consulted for assistance with dementia resources  Care guide performed the following interventions: Spoke with patient's daughter [name] Power she stated that she has been referred to a SW for assistance with SNF placement for her mother. Verified email [email or web address] sent infomation about [facility].                                                                        .  Follow Up Plan:  No further follow up planned at this time. The patient has been provided with needed resources.  Trixy Loyola, AAS Paralegal, [facility] Care Guide . Embedded Care Coordination [facility]  Care Management  [address] [place], [place] [address] ??[email or web address]  ?? [phone]   [email or web address]

## 2020-10-04 ENCOUNTER — Encounter: Payer: Self-pay | Admitting: *Deleted

## 2020-10-04 ENCOUNTER — Other Ambulatory Visit: Payer: Self-pay | Admitting: *Deleted

## 2020-10-04 NOTE — Patient Outreach (Signed)
Care Management Clinical Social Work Note  10/04/2020 Name: Jenna Carrillo MRN: [id number] DOB: Feb 04, 1931  Jenna Carrillo is a 87 y.o. year old female who is a primary care patient of Creola Corn, MD.  The Care Management team was consulted for assistance with chronic disease management and coordination needs.  Patient with Altered Mental Status, History of Falls/Fall Risk, Generalized Weakness, Unsteady Gait, Impaired Mobility, Balance Problems and Hypertension needs assistance with initiating long-term placement into a memory care assisted living facility.    Engaged with patient and daughter, Jenna Carrillo by telephone for initial telephone outreach call in response to provider referral for social work chronic care management and care coordination services.  Consent to Services:  Ms. Bonsall was given information about Care Management services today including:  1. Care Management services includes personalized support from designated clinical staff supervised by her physician, including individualized plan of care and coordination with other care providers 2. 24/7 contact phone numbers for assistance for urgent and routine care needs. 3. The patient may stop case management services at any time by phone call to the office staff.  Patient agreed to services and consent obtained.   Assessment: Review of patient past medical history, allergies, medications, and health status, including review of relevant consultants reports was performed today as part of a comprehensive evaluation and provision of chronic care management and care coordination services.  SDOH (Social Determinants of Health) assessments and interventions performed:  SDOH Interventions   Flowsheet Row Most Recent Value  SDOH Interventions   Food Insecurity Interventions Intervention Not Indicated, Other (Comment)  [Verified by daughter [name] Powers]  Financial Strain Interventions Intervention Not Indicated, Other (Comment)   [Verified by daughter [name] Powers]  Housing Interventions Intervention Not Indicated, Other (Comment)  [Verified by daughter [name] Powers]  Intimate Partner Violence Interventions Intervention Not Indicated, Other (Comment)  [Verified by daughter [name] Powers]  Physical Activity Interventions Intervention Not Indicated, Other (Comments)  [Verified by daughter [name] Powers]  Stress Interventions Intervention Not Indicated, Other (Comment)  [Verified by daughter [name] Powers]  Social Connections Interventions Intervention Not Indicated, Other (Comment)  [Verified by daughter [name] Powers]  Transportation Interventions Intervention Not Indicated, Other (Comment)  [Verified by daughter [name] Powers]       Advanced Directives Status: Not addressed in this encounter.  Care Plan  Allergies  Allergen Reactions  . Sulfonamide Derivatives Rash    Outpatient Encounter Medications as of 10/04/2020  Medication Sig  . amitriptyline (ELAVIL) 50 MG tablet Take 50 mg by mouth at bedtime.  . carvedilol (COREG) 12.5 MG tablet Take 1 tablet (12.5 mg total) by mouth 2 (two) times daily with a meal.  . cholecalciferol (VITAMIN D) 25 MCG tablet Take 1 tablet (1,000 Units total) by mouth daily.  Marland Kitchen levothyroxine (SYNTHROID) 75 MCG tablet Take 1 tablet (75 mcg total) by mouth daily at 6 (six) AM.  . memantine (NAMENDA) 10 MG tablet Take 1 tablet by mouth 2 (two) times daily.  Marland Kitchen omeprazole (PRILOSEC) 20 MG capsule Take 20 mg by mouth daily.  . predniSONE (DELTASONE) 1 MG tablet Take 3 mg by mouth daily.   . sertraline (ZOLOFT) 50 MG tablet Take 50 mg by mouth daily. 1 Tablet Daily  . trimethoprim (TRIMPEX) 100 MG tablet Take 100 mg by mouth daily.  Monte Fantasia INHUB 100-50 MCG/DOSE AEPB Inhale 2 puffs into the lungs daily.    No facility-administered encounter medications on file as of 10/04/2020.  Patient Active Problem List   Diagnosis Date Noted  . Hypothyroidism   . AMS (altered mental  status) 04/16/2020  . Acute respiratory failure with hypoxia and hypercapnia (HCC) 10/23/2019  . Sepsis secondary to UTI (HCC) 09/21/2019  . Proteus mirabilis infection 09/18/2019  . Acute lower UTI 09/18/2019  . Metabolic encephalopathy 09/02/2019  . Acute metabolic encephalopathy 09/01/2019  . Leukocytosis 09/01/2019  . Acute systolic heart failure (HCC) 01/28/2019  . AF (paroxysmal atrial fibrillation) (HCC)   . Current chronic use of systemic steroids 01/25/2019  . Encephalopathy 01/25/2019  . Fall from slip, trip, or stumble, initial encounter   . Generalized weakness   . Sepsis due to pneumonia (HCC) 01/24/2019  . Bronchiectasis with (acute) exacerbation (HCC) 12/20/2018  . Lobar pneumonia, unspecified organism (HCC) 02/15/2018  . Nuclear sclerosis of both eyes 03/05/2016  . Glaucoma suspect, bilateral 03/05/2016  . CAP (community acquired pneumonia) 11/12/2015  . Pulmonary nodules 10/12/2015  . Cough 02/03/2014  . Macular degeneration, left eye 12/28/2013  . Glaucoma suspect, both eyes 12/28/2013  . Allergic rhinitis 11/12/2012  . Nuclear cataract 08/08/2011  . BRONCHIECTASIS 03/09/2009  . COPD (chronic obstructive pulmonary disease) (HCC) 03/09/2009  . Essential hypertension 01/31/2009  . Polymyalgia rheumatica (HCC) 01/31/2009    Conditions to be addressed/monitored: Patient with Altered Mental Status, History of Falls/Fall Risk, Generalized Weakness, Unsteady Gait, Impaired Mobility, Balance Problems and Hypertension needs assistance with initiating long-term placement into a memory care assisted living facility.   Level of Care Concerns, ADL/IADL Limitations, Limited Access to Caregiver and Memory Deficits.  Care Plan : LCSW Plan of Care  Updates made by Karolee Stamps, LCSW since 10/04/2020 12:00 AM    Problem: Receive Assistance w/ Long-Term Care Placement into a Memory Care Assisted Living Facility.   Priority: High    Goal: Receive Assistance w/ Long-Term  Care Placement into a Memory Care Assisted Living Facility.   Start Date: 10/04/2020  Expected End Date: 10/26/2020  This Visit's Progress: On track  Priority: High  Note:   Current Barriers:    Patient with Altered Mental Status, History of Falls/Fall Risk, Generalized Weakness, Unsteady Gait, Impaired Mobility, Balance Problems and Hypertension needs assistance with initiating long-term placement into a memory care assisted living facility.    Patient's health has declined and patient is no longer able to complete activities of daily living independently.  Patient with Altered Mental Status now requires 24 hour care and supervision for safety.  Patient acknowledges deficits with education and support in order to meet this need. Clinical Goal(s):   Over the next 30 to 45 days, patient will be placed into a long-term memory care assisted living facility.    Patient and daughter, Jenna Carrillo will work with LCSW to coordinate needs for long-term memory care assisted living placement. Interventions:  Collaboration with Primary Care Physician, Dr. Creola Corn regarding development and update of comprehensive plan of care as evidenced by provider attestation and co-signature.  Inter-disciplinary care team collaboration (see longitudinal plan of care).  Assessed needs and provided education on level of care and facility placement process.  Completed FL-2 Form and faxed to Primary Care Physician, Dr. Creola Corn for review and signature.  Obtained patient and daughter, Jenna Carrillo verbal consent to fax FL-2 Form to facilities of interest.  Obtained patient and daughter, Jenna Carrillo verbal consent to initiate a PASSAR number.  Reviewed and initiated process to obtain PASSAR number.  Once completed and signed FL-2 Form is obtained from patient's Primary  Care Physician, Dr. Creola Corn, LCSW agreed to fax to all facilities of interest to try and pursue bed offers.  Obtained facilities of  interest from daughter, Jenna Carrillo, which include:  [facility], [facility] and [facility].  Encouraged patient and daughter, Jenna Carrillo to decide on at least 3 additional facilities of interest, in the event that [facility], [facility] and [facility] do not have long-term female beds available.  Collaboration with Primary Care Physician, Dr. Creola Corn to request a rapid COVID-19 Screening.  Collaboration with representative from [employer] to try and obtain approval/prior authorization for long-term memory care assisted living placement.    Discussed plans with patient and daughter, Jenna Carrillo for ongoing care management follow up and provided patient with direct contact information for care management team.  LCSW agreed to obtain patient's most recent Chest X-Ray results.  Advised patient's daughter, Jenna Carrillo to contact patient's Medicaid Case Worker, with the [facility] Department of [facility], to begin the process of changing patient's Adult Medicaid coverage to Special Assistance Long-Term Care Medicaid coverage, providing instructions for doing so. Patient Goals/Self-Care Activities:  Continue to work with Key, Private Agency In-Home Care Aide, 20 hours per week, to receive supervision and assistance with activities of daily living.  Contact Medicaid Case Worker, with the [facility] Department of Social Services, to begin the process of changing patient's Adult Medicaid coverage to Special Assistance Long-Term Care Medicaid coverage.  Work with LCSW on a weekly basis to secure placement into a long-term memory care assisted living facility.  Obtain rapid COVID-19 Screening within 72 hours of admission into a long-term memory care assisted living facility. Follow-Up:  10/11/2020 at 1:30pm.      Follow Up Plan: 10/11/2020 at 1:30pm.  Danford Bad, BSW, MSW, LCSW  Licensed Clinical  Social Worker  [facility]  Mailing [place]. [address] [place], [place] [address] Physical Address-[address], [place], [place] [address] Toll Free Main # [phone] Fax # [phone] Cell # [phone]  [email or web address]

## 2020-10-11 ENCOUNTER — Other Ambulatory Visit: Payer: Self-pay | Admitting: *Deleted

## 2020-10-11 NOTE — Patient Outreach (Signed)
[facility]) Care Management  [facility] Social Work  10/11/2020  Jenna Carrillo 1930-10-02 [id number]  Patient with Altered Mental Status, History of Falls/Fall Risk, Generalized Weakness, Unsteady Gait, Impaired Mobility, Balance Problems and Hypertension needs assistance with initiating long-term placement into a memory care assisted living facility.    Encounter Medications:  Outpatient Encounter Medications as of 10/11/2020  Medication Sig  . amitriptyline (ELAVIL) 50 MG tablet Take 50 mg by mouth at bedtime.  . carvedilol (COREG) 12.5 MG tablet Take 1 tablet (12.5 mg total) by mouth 2 (two) times daily with a meal.  . cholecalciferol (VITAMIN D) 25 MCG tablet Take 1 tablet (1,000 Units total) by mouth daily.  Marland Kitchen levothyroxine (SYNTHROID) 75 MCG tablet Take 1 tablet (75 mcg total) by mouth daily at 6 (six) AM.  . memantine (NAMENDA) 10 MG tablet Take 1 tablet by mouth 2 (two) times daily.  Marland Kitchen omeprazole (PRILOSEC) 20 MG capsule Take 20 mg by mouth daily.  . predniSONE (DELTASONE) 1 MG tablet Take 3 mg by mouth daily.   . sertraline (ZOLOFT) 50 MG tablet Take 50 mg by mouth daily. 1 Tablet Daily  . trimethoprim (TRIMPEX) 100 MG tablet Take 100 mg by mouth daily.  Monte Fantasia INHUB 100-50 MCG/DOSE AEPB Inhale 2 puffs into the lungs daily.    No facility-administered encounter medications on file as of 10/11/2020.    Functional Status:  In your present state of health, do you have any difficulty performing the following activities: 10/04/2020 09/26/2020  Hearing? N N  Vision? N N  Difficulty concentrating or making decisions? Y N  Comment Altered Mental Status -  Walking or climbing stairs? [name]  Comment Has private agency in-home care aid to assist "wobbly" per caregiver, walker prn  Dressing or bathing? [name]  Comment Has private agency in-home care aid to assist requires assistance  Doing errands, shopping? [name]  Comment Has private agency in-home care aid to assist  requires assistance  Preparing Food and eating ? [name]  Comment Has private agency in-home care aid to assist requires assistance  Using the Toilet? [name]  Comment Has private agency in-home care aid to assist requires assistance  In the past six months, have you accidently leaked urine? Y N  Comment Wears Depends -  Do you have problems with loss of bowel control? Y N  Comment Wears Depends -  Managing your Medications? [name]  Comment Has private agency in-home care aid to assist family assists  Managing your Finances? [name]  Comment Has private agency in-home care aid to assist family assists  Housekeeping or managing your Housekeeping? [name]  Comment Has private agency in-home care aid to assist family assists  Some recent data might be hidden    Fall/Depression Screening:  PHQ 2/9 Scores 10/04/2020 09/26/2020  PHQ - 2 Score 0 0  Exception Documentation Medical reason Medical reason    Assessment:  Goals Addressed            This Visit's Progress   . Receive Assistance w/ Long-Term Care Placement into a Memory Care Assisted Living Facility.   On track    Timeframe:  Short-Term Goal Priority:  High Start Date:  10/04/2020                         Expected End Date:   10/26/2020  Follow Up Date:  10/17/2020 at 4:00pm.  Patient Goals/Self-Care Activities:  Continue to work with Key, Private Agency In-Home Care Aide, 20 hours per week, to receive supervision and assistance with activities of daily living.  Receive assistance from daughter/healthcare power of attorney, Ronald Pippins with contacting Medicaid Case Worker, with the [facility] Department of [facility], to begin the process of changing Adult Medicaid coverage to Special Assistance Long-Term Care Medicaid coverage.  Work with LCSW on a weekly basis to secure placement into a long-term memory care assisted living facility.  Await long-term care bed offer from [facility]  at [facility].  Obtain rapid COVID-19 Screening within 72 hours of admission into a long-term memory care assisted living facility.      Follow-up:  10/17/2020 at 4:00pm.  Danford Bad, BSW, MSW, LCSW  Licensed Clinical Social Worker  [facility]  Mailing [place]. [address], [place], [place] [address] Physical Address-[address], [place], [place] [address] Toll Free Main # [phone] Fax # [phone] Cell # [phone]  [email or web address]

## 2020-10-17 ENCOUNTER — Other Ambulatory Visit: Payer: Self-pay

## 2020-10-17 ENCOUNTER — Other Ambulatory Visit: Payer: Self-pay | Admitting: *Deleted

## 2020-10-17 NOTE — Patient Outreach (Signed)
[facility]) Care Management  10/17/2020  Jenna Carrillo [birth year] [id number]   Telephone Assessment   Successful outreach to caregiver/daughter-[name]. She provides update of status of patient getting LTC placement. She is still working with [facility] SW and MD office to complete necessary paperwork and enrollment info. Daughter shares that [facility] evaluated patient a second time and deemed she needs not ALF but memory care and they have a waiting list. Daughter had already deposited $500 and is working with facility to get her money refunded since they do not have any beds available for patient.  Family is trying to pursue [facility] facility as they currently do not have waiting lit and able tot take patient as soon as all necessary pre-requisite documents and things completed. Medically wise she voices that patient is doing the same-no change. Patient aware of placement ad is actually looking forward to it per caregiver report. She denies any RN CM needs or concerns at this time.   Medications Reviewed Today    Reviewed by Charlyn Minerva, RN (Registered Nurse) on 10/17/20 at 1354  Med List Status: <None>  Medication Order Taking? Sig Documenting Provider Last Dose Status Informant  amitriptyline (ELAVIL) 50 MG tablet [id number] No Take 50 mg by mouth at bedtime. [provider] Taking Active Child  carvedilol (COREG) 12.5 MG tablet [id number] No Take 1 tablet (12.5 mg total) by mouth 2 (two) times daily with a meal. O'Neal, Ronnald Ramp, MD Taking Active Child  cholecalciferol (VITAMIN D) 25 MCG tablet [id number] No Take 1 tablet (1,000 Units total) by mouth daily. Almon Hercules, MD Taking Active Child  levothyroxine (SYNTHROID) 75 MCG tablet [id number] No Take 1 tablet (75 mcg total) by mouth daily at 6 (six) AM. Rolly Salter, MD Taking Active Child  memantine [facility]) 10 MG tablet [id number] No Take 1 tablet by mouth 2 (two) times daily.  [provider] Taking Active   omeprazole (PRILOSEC) 20 MG capsule [id number] No Take 20 mg by mouth daily. [provider] Taking Active Child           Med Note Para March, MACI D   Thu Sep 01, 2019  6:51 PM)    predniSONE (DELTASONE) 1 MG tablet [id number] No Take 3 mg by mouth daily.  [provider] Taking Active Child  sertraline (ZOLOFT) 50 MG tablet [id number] No Take 50 mg by mouth daily. 1 Tablet Daily [provider] Taking Active   trimethoprim (TRIMPEX) 100 MG tablet [id number] No Take 100 mg by mouth daily. [provider] Taking Active Child  WIXELA INHUB 100-50 MCG/DOSE AEPB [id number] No Inhale 2 puffs into the lungs daily.  [provider] Taking Active Child          Goals Addressed              This Visit's Progress   .  (THN)Planning for Long-Term Care-Dementia (pt-stated)        Timeframe:  Long-Range Goal Priority:  High Start Date:   09/26/20                          Expected End Date:  01/15/2021                     Follow Up Date July 2022   Barriers: Other -paperwork process   - check out other places when staying at home is no longer possible (assisted living center, nursing  home) - check out services like in-home help or adult day care - look at health insurance policy and other saving accounts so you know how much money you have - make a list of people who can help and what they can do    Why is this important?    Learning that you or your loved one has dementia can be scary and stressful.   You can reduce stress by planning.   Preparing for the future is one of the most important things to do.   Thinking about how much care you/your loved one will need and how much it will cost is not easy.   Early on, you/your loved one can be part of making decisions for the future.   Making sure that your/your loved one's wishes for care are known is important.     Notes:  09/26/20-Daughter reports that they  are interested in SNF placement for patient. She has toured and looked at some facilities already. She would like to begin process-referred to SW for assistance with placement. 10/17/2020-Daughter continues to work with [facility] SW and MD office to complete necessary paperwork to get patient approve for placement. She is hopeful that patient will be placed by the end of the month.     .  (THN)Prevent Falls-Dementia (pt-stated)        Timeframe:  Long-Range Goal Priority:  High Start Date:  09/26/20                           Expected End Date:  01/15/2021                     Follow Up Date July 2022   Barriers: Health Behaviors     - always wear shoes or slippers with non-slip sole - make an emergency alert plan in case I fall - use a cane or walker    Why is this important?    There may be trouble with balance and getting around. Falls can happen.    Notes:  09/26/20-Caregiver reports no recent falls. Patient is "whobbly" at times. She has walker but only uses prn-primarily outside of the home.   10/17/2020-caregiver confirms no recent falls. Patient continues to ambulate primarily without assistance.         Plan: RN CM discussed with caregiver next outreach within the month of July. Caregiver gave verbal consent and in agreement with RN CM follow up and timeframe. Caregiver aware that they may contact RN CM sooner for any issues or concerns. RN CM reviewed goals and plan of care with caregiver. Caregiver agrees to care plan and follow up.  Antionette Fairy, RN,BSN,CCM [facility] Care Management Telephonic Care Management Coordinator Direct Phone: [phone] Toll Free: [phone] Fax: [phone]

## 2020-10-17 NOTE — Patient Outreach (Signed)
[facility]) Care Management  [facility] Social Work  10/17/2020  Jenna Carrillo 1930-12-25 [id number]  Patient with Altered Mental Status, History of Falls/Fall Risk, Generalized Weakness, Unsteady Gait, Impaired Mobility, Balance Problems and Hypertension needs assistance with initiating long-term placement into a memory care assisted living facility.  Patient's health has declined and patient is no longer able to complete activities of daily living independently.  Encounter Medications:  Outpatient Encounter Medications as of 10/17/2020  Medication Sig  . amitriptyline (ELAVIL) 50 MG tablet Take 50 mg by mouth at bedtime.  . carvedilol (COREG) 12.5 MG tablet Take 1 tablet (12.5 mg total) by mouth 2 (two) times daily with a meal.  . cholecalciferol (VITAMIN D) 25 MCG tablet Take 1 tablet (1,000 Units total) by mouth daily.  Marland Kitchen levothyroxine (SYNTHROID) 75 MCG tablet Take 1 tablet (75 mcg total) by mouth daily at 6 (six) AM.  . memantine (NAMENDA) 10 MG tablet Take 1 tablet by mouth 2 (two) times daily.  Marland Kitchen omeprazole (PRILOSEC) 20 MG capsule Take 20 mg by mouth daily.  . predniSONE (DELTASONE) 1 MG tablet Take 3 mg by mouth daily.   . sertraline (ZOLOFT) 50 MG tablet Take 50 mg by mouth daily. 1 Tablet Daily  . trimethoprim (TRIMPEX) 100 MG tablet Take 100 mg by mouth daily.  Monte Fantasia INHUB 100-50 MCG/DOSE AEPB Inhale 2 puffs into the lungs daily.    No facility-administered encounter medications on file as of 10/17/2020.    Functional Status:  In your present state of health, do you have any difficulty performing the following activities: 10/04/2020 09/26/2020  Hearing? N N  Vision? N N  Difficulty concentrating or making decisions? Y N  Comment Altered Mental Status -  Walking or climbing stairs? [name]  Comment Has private agency in-home care aid to assist "wobbly" per caregiver, walker prn  Dressing or bathing? [name]  Comment Has private agency in-home care aid to assist  requires assistance  Doing errands, shopping? [name]  Comment Has private agency in-home care aid to assist requires assistance  Preparing Food and eating ? [name]  Comment Has private agency in-home care aid to assist requires assistance  Using the Toilet? [name]  Comment Has private agency in-home care aid to assist requires assistance  In the past six months, have you accidently leaked urine? Y N  Comment Wears Depends -  Do you have problems with loss of bowel control? Y N  Comment Wears Depends -  Managing your Medications? [name]  Comment Has private agency in-home care aid to assist family assists  Managing your Finances? [name]  Comment Has private agency in-home care aid to assist family assists  Housekeeping or managing your Housekeeping? [name]  Comment Has private agency in-home care aid to assist family assists  Some recent data might be hidden    Fall/Depression Screening:  PHQ 2/9 Scores 10/04/2020 09/26/2020  PHQ - 2 Score 0 0  Exception Documentation Medical reason Medical reason    Assessment:  Goals Addressed            This Visit's Progress   . Receive Assistance w/ Long-Term Care Placement into a Memory Care Assisted Living Facility.   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  Receive assistance from daughter/healthcare power of attorney, Jenna Carrillo with contacting Medicaid Case Worker, with the [facility] Department of Social Services, to change Adult Medicaid coverage to Special Assistance Long-Term Care Medicaid coverage.  Work with LCSW on a weekly basis to secure placement into a long-term memory care assisted living  facility.  Await "official" long-term care bed offer from [facility] Memory Care Assisted Living facility.  Obtain negative rapid COVID-19 Screening within 72 hours of admission into a long-term memory care assisted living facility.  Obtain negative Tuberculosis test results and submit to long-term care assisted living facility of choice, once bed offer is confirmed.  Await appointment with your Primary Care Physician, Dr. Creola Corn to undergo a complete assessment and obtain official diagnosis of Dementia.      Follow-up:  10/25/2020 at 9:00am.  Danford Bad, BSW, MSW, LCSW  Licensed Clinical Social Worker  [facility]  Mailing [place]. [address] [place], [place] [address] Physical Address-[address], [place], [place] [address] Toll Free Main # [phone] Fax # [phone] Cell # [phone]  [email or web address]

## 2020-10-18 ENCOUNTER — Ambulatory Visit: Payer: Self-pay

## 2020-10-25 ENCOUNTER — Encounter: Payer: Self-pay | Admitting: *Deleted

## 2020-10-25 ENCOUNTER — Other Ambulatory Visit: Payer: Self-pay | Admitting: *Deleted

## 2020-10-25 NOTE — Patient Outreach (Signed)
[facility]) Care Management  Jenna Carrillo [facility] Social Work  10/25/2020  Jenna Carrillo 15-Aug-1930 [id number]  Encounter Medications:  Outpatient Encounter Medications as of 10/25/2020  Medication Sig   amitriptyline (ELAVIL) 50 MG tablet Take 50 mg by mouth at bedtime.   carvedilol (COREG) 12.5 MG tablet Take 1 tablet (12.5 mg total) by mouth 2 (two) times daily with a meal.   cholecalciferol (VITAMIN D) 25 MCG tablet Take 1 tablet (1,000 Units total) by mouth daily.   levothyroxine (SYNTHROID) 75 MCG tablet Take 1 tablet (75 mcg total) by mouth daily at 6 (six) AM.   memantine (NAMENDA) 10 MG tablet Take 1 tablet by mouth 2 (two) times daily.   omeprazole (PRILOSEC) 20 MG capsule Take 20 mg by mouth daily.   predniSONE (DELTASONE) 1 MG tablet Take 3 mg by mouth daily.    sertraline (ZOLOFT) 50 MG tablet Take 50 mg by mouth daily. 1 Tablet Daily   trimethoprim (TRIMPEX) 100 MG tablet Take 100 mg by mouth daily.   WIXELA INHUB 100-50 MCG/DOSE AEPB Inhale 2 puffs into the lungs daily.    No facility-administered encounter medications on file as of 10/25/2020.    Functional Status:  In your present state of health, do you have any difficulty performing the following activities: 10/04/2020 09/26/2020  Hearing? N N  Vision? N N  Difficulty concentrating or making decisions? Y N  Comment Altered Mental Status -  Walking or climbing stairs? Jenna Carrillo  Comment Has private agency in-home care aid to assist "wobbly" per caregiver, walker prn  Dressing or bathing? Jenna Carrillo  Comment Has private agency in-home care aid to assist requires assistance  Doing errands, shopping? Jenna Carrillo  Comment Has private agency in-home care aid to assist requires assistance  Preparing Food and eating ? Jenna Carrillo  Comment Has private agency in-home care aid to assist requires assistance  Using the Toilet? Jenna Carrillo  Comment Has private agency in-home care aid to assist requires assistance  In the past six months, have you accidently  leaked urine? Y N  Comment Wears Depends -  Do you have problems with loss of bowel control? Y N  Comment Wears Depends -  Managing your Medications? Jenna Carrillo  Comment Has private agency in-home care aid to assist family assists  Managing your Finances? Jenna Carrillo  Comment Has private agency in-home care aid to assist family assists  Housekeeping or managing your Housekeeping? Jenna Carrillo  Comment Has private agency in-home care aid to assist family assists  Some recent data might be hidden    Fall/Depression Screening:  PHQ 2/9 Scores 10/04/2020 09/26/2020  PHQ - 2 Score 0 0  Exception Documentation Medical reason Medical reason    Assessment:  Care Plan Care Plan : LCSW Plan of Care  Updates made by Jenna Stamps, LCSW since 10/25/2020 12:00 AM     Problem: Receive Assistance w/ Long-Term Care Placement into a Memory Care Assisted Living Facility. Resolved 10/25/2020  Priority: High     Goal: Receive Assistance w/ Long-Term Care Placement into a Memory Care Assisted Living Facility. Completed 10/25/2020  Start Date: 10/04/2020  Expected End Date: 10/25/2020  This Visit's Progress: On track  Recent Progress: On track  Priority: High  Note:   Current Barriers:   Patient with Altered Mental Status, History of Falls/Fall Risk, Generalized Weakness, Unsteady Gait, Impaired Mobility, Balance Problems and Hypertension needs assistance with initiating long-term placement into a memory care assisted living facility.  Patient's health has declined and patient is no longer able to complete activities of daily living independently. Patient with Altered Mental Status now requires 24 hour care and supervision for safety. Patient acknowledges deficits with education and support in order to meet this need. Clinical Goal(s):  Over the next 30 to 45 days, patient will be placed into a long-term memory care assisted living facility.   Patient and daughter, Jenna Carrillo will work with LCSW to coordinate needs for  long-term memory care assisted living placement. Interventions: Collaboration with Primary Care Physician, Dr. Creola Corn regarding development and update of comprehensive plan of care as evidenced by provider attestation and co-signature. Inter-disciplinary care team collaboration (see longitudinal plan of care). Assessed needs and provided education on level of care and facility placement process. Completed FL-2 Form and faxed to Primary Care Physician, Dr. Creola Corn for review and signature. Obtained patient and daughter, Jenna Carrillo verbal consent to fax FL-2 Form to facilities of interest. Obtained patient and daughter, Jenna Carrillo verbal consent to initiate a PASSAR number. Reviewed and initiated process to obtain PASSAR number. Once completed and signed FL-2 Form is obtained from patient's Primary Care Physician, Dr. Creola Corn, LCSW agreed to fax to all facilities of interest to try and pursue bed offers. Obtained facilities of interest from daughter, Jenna Carrillo, which include:  [facility], [facility] and [facility]. Encouraged patient and daughter, Jenna Carrillo to decide on at least 3 additional facilities of interest, in the event that [facility], [facility] and [facility] do not have long-term female beds available. Collaboration with Primary Care Physician, Dr. Creola Corn to request a rapid COVID-19 Screening. Collaboration with representative from [employer] to try and obtain approval/prior authorization for long-term memory care assisted living placement.   Discussed plans with patient and daughter, Jenna Carrillo for ongoing care management follow up and provided patient with direct contact information for care management team. LCSW agreed to obtain patient's most recent Chest X-Ray results. Advised patient's daughter, Jenna Carrillo to contact patient's Medicaid Case Worker, with the [facility] Department  of [facility], to begin the process of changing patient's Adult Medicaid coverage to Special Assistance Long-Term Care Medicaid coverage, providing instructions for doing so. Patient Goals/Self-Care Activities: Continue to work with [employer], Private Agency In-Home Care Aide, 20 hours per week, to receive supervision and assistance with activities of daily living. Receive assistance from daughter/healthcare power of attorney, Jenna Carrillo with contacting Medicaid Case Worker, with the [facility] Department of Social Services, to change Adult Medicaid coverage to Special Assistance Long-Term Care Medicaid coverage. Continue to await "official" long-term care bed offer from [facility] Memory Care Assisted Living facility. All paperwork has been faxed to [facility] and patient is on waiting list for bed availability.   Obtain negative rapid COVID-19 Screening within 72 hours of admission into a long-term memory care assisted living facility. Obtain negative Tuberculosis test results and submit to long-term care assisted living facility of choice, once bed offer is confirmed. Await appointment with your Primary Care Physician, Dr. Creola Corn to undergo a complete assessment and obtain official diagnosis of Dementia. Contact LCSW directly at # [phone] if you wish to pursue other long-term memory care assisted living facilities, or if additional social work needs are identified in the near future. Follow-Up:  No Follow-Up Required.      Goals Addressed  This Visit's Progress    COMPLETED: Receive Assistance w/ Long-Term Care Placement into a Memory Care Assisted Living Facility.   On track    Timeframe:  Short-Term Goal Priority:  High Start Date:  10/04/2020                         Expected End Date:   10/25/2020                Follow Up Date:  No Follow-Up Required.  Patient Goals/Self-Care Activities: Continue to work  with [employer], Private Agency In-Home Care Aide, 20 hours per week, to receive supervision and assistance with activities of daily living. Receive assistance from daughter/healthcare power of attorney, Jenna Carrillo with contacting Medicaid Case Worker, with the [facility] Department of Social Services, to change Adult Medicaid coverage to Special Assistance Long-Term Care Medicaid coverage. Continue to await "official" long-term care bed offer from [facility] Memory Care Assisted Living facility. All paperwork has been faxed to [facility] and patient is on waiting list for bed availability.   Obtain negative rapid COVID-19 Screening within 72 hours of admission into a long-term memory care assisted living facility. Obtain negative Tuberculosis test results and submit to long-term care assisted living facility of choice, once bed offer is confirmed. Await appointment with your Primary Care Physician, Dr. Creola Corn to undergo a complete assessment and obtain official diagnosis of Dementia. Contact LCSW directly at # [phone] if you wish to pursue other long-term memory care assisted living facilities, or if additional social work needs are identified in the near future.        Follow-Up:  No Follow-Up Required.  Danford Bad, BSW, MSW, LCSW  Licensed Restaurant manager, [facility]  Mailing [place] [address], [place], [place] [address] Physical Address-[address] [place], [place] [address] Toll Free Main # [phone] Fax # [phone] Cell # [phone]  [email or web address]

## 2020-11-23 ENCOUNTER — Other Ambulatory Visit: Payer: Self-pay

## 2020-11-23 NOTE — Patient Outreach (Signed)
[facility]) Care Management  11/23/2020  Jenna Carrillo July 20, 1930 [id number]   Telephone Assessment    Successful call placed to patient's daughter-Jenna Carrillo. She is pleased to report that patient was finally able to get into facility. She has been there for about ten days and is adjusting fairly well. Family is pleased with the care that patient is receiving and satisfied with services being provided. Family appreciative of [facility] support in getting patient placed. No other needs or concerns at this time.     Plan: RN CM will close case at this time.    Antionette Fairy, RN,BSN,CCM [facility] Care Management Telephonic Care Management Coordinator Direct Phone: [phone] Toll Free: [phone] Fax: [phone]

## 2020-12-07 DIAGNOSIS — L851 Acquired keratosis [keratoderma] palmaris et plantaris: Secondary | ICD-10-CM | POA: Diagnosis not present

## 2020-12-07 DIAGNOSIS — M79673 Pain in unspecified foot: Secondary | ICD-10-CM | POA: Diagnosis not present

## 2020-12-07 DIAGNOSIS — G300 Alzheimer's disease with early onset: Secondary | ICD-10-CM | POA: Diagnosis not present

## 2020-12-07 DIAGNOSIS — B351 Tinea unguium: Secondary | ICD-10-CM | POA: Diagnosis not present

## 2020-12-07 DIAGNOSIS — R269 Unspecified abnormalities of gait and mobility: Secondary | ICD-10-CM | POA: Diagnosis not present

## 2020-12-07 DIAGNOSIS — L603 Nail dystrophy: Secondary | ICD-10-CM | POA: Diagnosis not present

## 2020-12-16 ENCOUNTER — Emergency Department (HOSPITAL_COMMUNITY): Payer: Medicare HMO

## 2020-12-16 ENCOUNTER — Emergency Department (HOSPITAL_COMMUNITY)
Admission: EM | Admit: 2020-12-16 | Discharge: 2021-01-17 | Disposition: E | Payer: Medicare HMO | Attending: Emergency Medicine | Admitting: Emergency Medicine

## 2020-12-16 DIAGNOSIS — Z87891 Personal history of nicotine dependence: Secondary | ICD-10-CM | POA: Insufficient documentation

## 2020-12-16 DIAGNOSIS — D72829 Elevated white blood cell count, unspecified: Secondary | ICD-10-CM | POA: Diagnosis not present

## 2020-12-16 DIAGNOSIS — J449 Chronic obstructive pulmonary disease, unspecified: Secondary | ICD-10-CM | POA: Insufficient documentation

## 2020-12-16 DIAGNOSIS — R0682 Tachypnea, not elsewhere classified: Secondary | ICD-10-CM | POA: Insufficient documentation

## 2020-12-16 DIAGNOSIS — R4020 Unspecified coma: Secondary | ICD-10-CM | POA: Diagnosis not present

## 2020-12-16 DIAGNOSIS — E039 Hypothyroidism, unspecified: Secondary | ICD-10-CM | POA: Diagnosis not present

## 2020-12-16 DIAGNOSIS — J80 Acute respiratory distress syndrome: Secondary | ICD-10-CM | POA: Diagnosis not present

## 2020-12-16 DIAGNOSIS — R0902 Hypoxemia: Secondary | ICD-10-CM | POA: Diagnosis not present

## 2020-12-16 DIAGNOSIS — I110 Hypertensive heart disease with heart failure: Secondary | ICD-10-CM | POA: Insufficient documentation

## 2020-12-16 DIAGNOSIS — Z7951 Long term (current) use of inhaled steroids: Secondary | ICD-10-CM | POA: Insufficient documentation

## 2020-12-16 DIAGNOSIS — R0989 Other specified symptoms and signs involving the circulatory and respiratory systems: Secondary | ICD-10-CM | POA: Insufficient documentation

## 2020-12-16 DIAGNOSIS — T17308A Unspecified foreign body in larynx causing other injury, initial encounter: Secondary | ICD-10-CM

## 2020-12-16 DIAGNOSIS — R000 Tachycardia, unspecified: Secondary | ICD-10-CM | POA: Insufficient documentation

## 2020-12-16 DIAGNOSIS — I5021 Acute systolic (congestive) heart failure: Secondary | ICD-10-CM | POA: Insufficient documentation

## 2020-12-16 DIAGNOSIS — T17490A Other foreign object in trachea causing asphyxiation, initial encounter: Secondary | ICD-10-CM | POA: Diagnosis not present

## 2020-12-16 DIAGNOSIS — Z79899 Other long term (current) drug therapy: Secondary | ICD-10-CM | POA: Diagnosis not present

## 2020-12-16 DIAGNOSIS — R404 Transient alteration of awareness: Secondary | ICD-10-CM | POA: Diagnosis not present

## 2020-12-16 DIAGNOSIS — R4182 Altered mental status, unspecified: Secondary | ICD-10-CM | POA: Diagnosis not present

## 2020-12-16 DIAGNOSIS — R918 Other nonspecific abnormal finding of lung field: Secondary | ICD-10-CM | POA: Diagnosis not present

## 2020-12-16 DIAGNOSIS — J8410 Pulmonary fibrosis, unspecified: Secondary | ICD-10-CM | POA: Diagnosis not present

## 2020-12-16 DIAGNOSIS — R0689 Other abnormalities of breathing: Secondary | ICD-10-CM | POA: Diagnosis not present

## 2020-12-16 LAB — CBC WITH DIFFERENTIAL/PLATELET
Abs Immature Granulocytes: 1.31 10*3/uL — ABNORMAL HIGH (ref 0.00–0.07)
Basophils Absolute: 0.2 10*3/uL — ABNORMAL HIGH (ref 0.0–0.1)
Basophils Relative: 1 %
Eosinophils Absolute: 0.1 10*3/uL (ref 0.0–0.5)
Eosinophils Relative: 1 %
HCT: 38.9 % (ref 36.0–46.0)
Hemoglobin: 11.5 g/dL — ABNORMAL LOW (ref 12.0–15.0)
Immature Granulocytes: 5 %
Lymphocytes Relative: 9 %
Lymphs Abs: 2.7 10*3/uL (ref 0.7–4.0)
MCH: 30.7 pg (ref 26.0–34.0)
MCHC: 29.6 g/dL — ABNORMAL LOW (ref 30.0–36.0)
MCV: 104 fL — ABNORMAL HIGH (ref 80.0–100.0)
Monocytes Absolute: 0.8 10*3/uL (ref 0.1–1.0)
Monocytes Relative: 3 %
Neutro Abs: 24.3 10*3/uL — ABNORMAL HIGH (ref 1.7–7.7)
Neutrophils Relative %: 81 %
Platelets: 209 10*3/uL (ref 150–400)
RBC: 3.74 MIL/uL — ABNORMAL LOW (ref 3.87–5.11)
RDW: 13.6 % (ref 11.5–15.5)
WBC: 29.4 10*3/uL — ABNORMAL HIGH (ref 4.0–10.5)
nRBC: 0.1 % (ref 0.0–0.2)

## 2020-12-16 LAB — I-STAT VENOUS BLOOD GAS, ED
Acid-base deficit: 3 mmol/L — ABNORMAL HIGH (ref 0.0–2.0)
Bicarbonate: 24.3 mmol/L (ref 20.0–28.0)
Calcium, Ion: 1.04 mmol/L — ABNORMAL LOW (ref 1.15–1.40)
HCT: 37 % (ref 36.0–46.0)
Hemoglobin: 12.6 g/dL (ref 12.0–15.0)
O2 Saturation: 90 %
Potassium: 6.3 mmol/L (ref 3.5–5.1)
Sodium: 137 mmol/L (ref 135–145)
TCO2: 26 mmol/L (ref 22–32)
pCO2, Ven: 52.5 mmHg (ref 44.0–60.0)
pH, Ven: 7.273 (ref 7.250–7.430)
pO2, Ven: 67 mmHg — ABNORMAL HIGH (ref 32.0–45.0)

## 2020-12-16 LAB — COMPREHENSIVE METABOLIC PANEL
ALT: 499 U/L — ABNORMAL HIGH (ref 0–44)
AST: 719 U/L — ABNORMAL HIGH (ref 15–41)
Albumin: 2.8 g/dL — ABNORMAL LOW (ref 3.5–5.0)
Alkaline Phosphatase: 163 U/L — ABNORMAL HIGH (ref 38–126)
Anion gap: 13 (ref 5–15)
BUN: 18 mg/dL (ref 8–23)
CO2: 21 mmol/L — ABNORMAL LOW (ref 22–32)
Calcium: 8.7 mg/dL — ABNORMAL LOW (ref 8.9–10.3)
Chloride: 103 mmol/L (ref 98–111)
Creatinine, Ser: 1.68 mg/dL — ABNORMAL HIGH (ref 0.44–1.00)
GFR, Estimated: 29 mL/min — ABNORMAL LOW (ref >60)
Glucose, Bld: 202 mg/dL — ABNORMAL HIGH (ref 70–99)
Potassium: 6.3 mmol/L (ref 3.5–5.1)
Sodium: 137 mmol/L (ref 135–145)
Total Bilirubin: 0.4 mg/dL (ref 0.3–1.2)
Total Protein: 5.8 g/dL — ABNORMAL LOW (ref 6.5–8.1)

## 2020-12-16 LAB — CBG MONITORING, ED: Glucose-Capillary: 197 mg/dL — ABNORMAL HIGH (ref 70–99)

## 2020-12-16 MED ORDER — LORAZEPAM 1 MG PO TABS: 1.0000 mg | ORAL_TABLET | ORAL | Status: DC | PRN

## 2020-12-16 MED ORDER — GLYCOPYRROLATE 0.2 MG/ML IJ SOLN: 0.2000 mg | INTRAMUSCULAR | Status: DC | PRN

## 2020-12-16 MED ORDER — LORAZEPAM 2 MG/ML PO CONC: 1.0000 mg | ORAL | Status: DC | PRN

## 2020-12-16 MED ORDER — HYDROMORPHONE HCL 1 MG/ML IJ SOLN: 0.5000 mg | INTRAMUSCULAR | Status: DC | PRN

## 2020-12-16 MED ORDER — ONDANSETRON 4 MG PO TBDP: 4.0000 mg | ORAL_TABLET | Freq: Four times a day (QID) | ORAL | Status: DC | PRN

## 2020-12-16 MED ORDER — ONDANSETRON HCL 4 MG/2ML IJ SOLN: 4.0000 mg | Freq: Four times a day (QID) | INTRAMUSCULAR | Status: DC | PRN

## 2020-12-16 MED ORDER — LORAZEPAM 2 MG/ML IJ SOLN: 1.0000 mg | INTRAMUSCULAR | Status: DC | PRN

## 2020-12-16 MED ORDER — SODIUM CHLORIDE 0.9 % IV BOLUS
1000.0000 mL | Freq: Once | INTRAVENOUS | Status: AC
  Administered 2020-12-16: 1000 mL via INTRAVENOUS

## 2020-12-16 MED ORDER — GLYCOPYRROLATE 1 MG PO TABS
1.0000 mg | ORAL_TABLET | ORAL | Status: DC | PRN
  Filled 2020-12-16: qty 1

## 2020-12-16 NOTE — ED Notes (Signed)
PA Layden aware of BP. Fluids started. Dr. Adela Lank at bedside

## 2020-12-16 NOTE — ED Notes (Signed)
[facility] notified, pt is not eligible for donor services. Referral number 12/12/2020-077 - spoke to [facility]

## 2020-12-16 NOTE — ED Notes (Signed)
Time of death 27 - confirmed by PA Reece Agar RN

## 2020-12-16 NOTE — ED Provider Notes (Signed)
[facility] EMERGENCY DEPARTMENT Provider Note   CSN: [id number] Arrival date & time: January 05, 2021  1742     History Chief Complaint  Patient presents with   Choking    Food obstructed airway     Jenna Carrillo is a 87 y.o. female with PMH/o COPD, GERD, HLD by EMS for choking.  EMS reports that patient was at dinner when she started choking on a piece of chicken that she was eating for dinner.  EMS reports that once they were on scene, they were able to remove piece of the chicken.  They do report that afterwards, patient had hypoxia and she was placed on a nonrebreather.  Patient was awake but was altered.  Per EMS, baseline patient is alert and oriented and cannot talk.  EM LEVEL 5 CAVEAT DUE TO AMS  The history is provided by the patient and the EMS personnel.      Past Medical History:  Diagnosis Date   Bronchiectasis    COPD (chronic obstructive pulmonary disease) (HCC)    Degenerative disk disease    Eczema    Gastric ulcer    GERD (gastroesophageal reflux disease)    Hiatal hernia    Hyperlipidemia    Hypertension    Hypothyroidism    Osteoarthritis    Polymyalgia rheumatica (HCC)    Pulmonary nodule, right    Vertebrobasilar insufficiency     Patient Active Problem List   Diagnosis Date Noted   Hypothyroidism    AMS (altered mental status) 04/16/2020   Acute respiratory failure with hypoxia and hypercapnia (HCC) 10/23/2019   Sepsis secondary to UTI (HCC) 09/21/2019   Proteus mirabilis infection 09/18/2019   Acute lower UTI 09/18/2019   Metabolic encephalopathy 09/02/2019   Acute metabolic encephalopathy 09/01/2019   Leukocytosis 09/01/2019   Acute systolic heart failure (HCC) 01/28/2019   AF (paroxysmal atrial fibrillation) (HCC)    Current chronic use of systemic steroids 01/25/2019   Encephalopathy 01/25/2019   Fall from slip, trip, or stumble, initial encounter    Generalized weakness    Sepsis due to pneumonia (HCC) 01/24/2019    Bronchiectasis with (acute) exacerbation (HCC) 12/20/2018   Lobar pneumonia, unspecified organism (HCC) 02/15/2018   Nuclear sclerosis of both eyes 03/05/2016   Glaucoma suspect, bilateral 03/05/2016   CAP (community acquired pneumonia) 11/12/2015   Pulmonary nodules 10/12/2015   Cough 02/03/2014   Macular degeneration, left eye 12/28/2013   Glaucoma suspect, both eyes 12/28/2013   Allergic rhinitis 11/12/2012   Nuclear cataract 08/08/2011   BRONCHIECTASIS 03/09/2009   COPD (chronic obstructive pulmonary disease) (HCC) 03/09/2009   Essential hypertension 01/31/2009   Polymyalgia rheumatica (HCC) 01/31/2009    Past Surgical History:  Procedure Laterality Date   BREAST CYST EXCISION     right   broken tail bone     CAROTID ENDARTERECTOMY     right   RHINOPLASTY     TOTAL ABDOMINAL HYSTERECTOMY       OB History   No obstetric history on file.     Family History  Problem Relation Age of Onset   COPD Mother    Breast cancer Mother    Emphysema Mother    Liver disease Son    Emphysema Father    Heart attack Father     Social History   Tobacco Use   Smoking status: Former    Packs/day: 2.00    Years: 2.00    Pack years: 4.00    Types: Cigarettes  Quit date: 05/19/1958    Years since quitting: 62.6   Smokeless tobacco: Never  Vaping Use   Vaping Use: Never used  Substance Use Topics   Alcohol use: No   Drug use: Never    Home Medications Prior to Admission medications   Medication Sig Start Date End Date Taking? Authorizing Provider  amitriptyline (ELAVIL) 50 MG tablet Take 50 mg by mouth at bedtime.    [provider]  carvedilol (COREG) 12.5 MG tablet Take 1 tablet (12.5 mg total) by mouth 2 (two) times daily with a meal. 04/07/19   O'Neal, Ronnald Ramp, MD  cholecalciferol (VITAMIN D) 25 MCG tablet Take 1 tablet (1,000 Units total) by mouth daily. 09/25/19   Almon Hercules, MD  levothyroxine (SYNTHROID) 75 MCG tablet Take 1 tablet (75 mcg total) by  mouth daily at 6 (six) AM. 01/31/19   Rolly Salter, MD  memantine (NAMENDA) 10 MG tablet Take 1 tablet by mouth 2 (two) times daily.    [provider]  omeprazole (PRILOSEC) 20 MG capsule Take 20 mg by mouth daily.    [provider]  predniSONE (DELTASONE) 1 MG tablet Take 3 mg by mouth daily.     [provider]  sertraline (ZOLOFT) 50 MG tablet Take 50 mg by mouth daily. 1 Tablet Daily    [provider]  trimethoprim (TRIMPEX) 100 MG tablet Take 100 mg by mouth daily. 04/01/20   [provider]  Monte Fantasia INHUB 100-50 MCG/DOSE AEPB Inhale 2 puffs into the lungs daily.  04/01/20   [provider]    Allergies    Sulfonamide derivatives  Review of Systems   Review of Systems  Unable to perform ROS: Acuity of condition   Physical Exam Updated Vital Signs BP (!) 74/48   Pulse (!) 120   Temp 98.6 F (37 C) (Oral)   Resp (!) 30   SpO2 98%   Physical Exam Vitals and nursing note reviewed.  Constitutional:      Appearance: She is ill-appearing.     Comments: Lethargic. Minimally responsive to stimuli.   HENT:     Head: Normocephalic and atraumatic.     Mouth/Throat:     Mouth: Mucous membranes are dry.     Comments: Dry mucous membranes. No obvious foreign body. Posterior oropharynx is clear.  Eyes:     General: Lids are normal.     Conjunctiva/sclera: Conjunctivae normal.     Pupils: Pupils are equal, round, and reactive to light.     Comments: Pupils 2-3 mm bilaterally.   Neck:     Comments: Equal carotid pulse bilaterally.  Small ecchymosis noted on the lateral neck.  No underlying hematoma.  No crepitus Cardiovascular:     Rate and Rhythm: Regular rhythm. Tachycardia present.     Pulses:          Radial pulses are 1+ on the right side and 1+ on the left side.       Dorsalis pedis pulses are 1+ on the right side and 1+ on the left side.     Heart sounds: Normal heart sounds. No murmur heard.   No friction rub. No  gallop.  Pulmonary:     Effort: Pulmonary effort is normal. Tachypnea present.     Breath sounds: No stridor. Rhonchi present.     Comments: Tachypnea noted.  Increased work of breathing noted.  No stridor.  Diffuse rhonchi noted throughout all lung fields.  No acute decreased breath  sounds noted. Abdominal:     Palpations: Abdomen is soft. Abdomen is not rigid.     Tenderness: There is no abdominal tenderness. There is no guarding.     Comments: Abdomen is soft, non-distended, non-tender. No rigidity, No guarding. No peritoneal signs.  Musculoskeletal:        General: Normal range of motion.  Skin:    General: Skin is warm and dry.     Capillary Refill: Capillary refill takes less than 2 seconds.  Neurological:     Comments: Some spontaneous movement but does not respond to my questions.   Psychiatric:        Speech: Speech normal.    ED Results / Procedures / Treatments   Labs (all labs ordered are listed, but only abnormal results are displayed) Labs Reviewed  CBC WITH DIFFERENTIAL/PLATELET - Abnormal; Notable for the following components:      Result Value   WBC 29.4 (*)    RBC 3.74 (*)    Hemoglobin 11.5 (*)    MCV 104.0 (*)    MCHC 29.6 (*)    Neutro Abs 24.3 (*)    Basophils Absolute 0.2 (*)    Abs Immature Granulocytes 1.31 (*)    All other components within normal limits  COMPREHENSIVE METABOLIC PANEL - Abnormal; Notable for the following components:   Potassium 6.3 (*)    CO2 21 (*)    Glucose, Bld 202 (*)    Creatinine, Ser 1.68 (*)    Calcium 8.7 (*)    Total Protein 5.8 (*)    Albumin 2.8 (*)    AST 719 (*)    ALT 499 (*)    Alkaline Phosphatase 163 (*)    GFR, Estimated 29 (*)    All other components within normal limits  CBG MONITORING, ED - Abnormal; Notable for the following components:   Glucose-Capillary 197 (*)    All other components within normal limits  I-STAT VENOUS BLOOD GAS, ED - Abnormal; Notable for the following components:   pO2, Ven  67.0 (*)    Acid-base deficit 3.0 (*)    Potassium 6.3 (*)    Calcium, Ion 1.04 (*)    All other components within normal limits    EKG None  Radiology CT Head Wo Contrast  Result Date: 2020-12-24 CLINICAL DATA:  Altered mental status. Choking on food. Per medic, patient had chicken in throat. Medic endorses that patient was more alert PTA. Upon arrival, patient mentation altered, skin color slightly pallor EXAM: CT HEAD WITHOUT CONTRAST TECHNIQUE: Contiguous axial images were obtained from the base of the skull through the vertex without intravenous contrast. COMPARISON:  CT head 04/16/2020 FINDINGS: Brain: Cerebral ventricle sizes are concordant with the degree of cerebral volume loss. Patchy and confluent areas of decreased attenuation are noted throughout the deep and periventricular white matter of the cerebral hemispheres bilaterally, compatible with chronic microvascular ischemic disease. No evidence of large-territorial acute infarction. No parenchymal hemorrhage. No mass lesion. No extra-axial collection. No mass effect or midline shift. No hydrocephalus. Basilar cisterns are patent. Vascular: No hyperdense vessel. Skull: No acute fracture or focal lesion. Sinuses/Orbits: Paranasal sinuses and mastoid air cells are clear. Bilateral lens replacement. Otherwise the orbits are unremarkable. Other: None. IMPRESSION: No acute intracranial abnormality. Electronically Signed   By: Tish Frederickson M.D.   On: 12/24/20 19:40   DG Chest Port 1 View  Result Date: 12-24-2020 CLINICAL DATA:  Aspiration. EXAM: PORTABLE CHEST 1 VIEW COMPARISON:  Chest radiograph 04/19/2020 FINDINGS:  Lung volumes are low. Normal heart size. Normal mediastinal contours with aortic atherosclerosis. Again seen calcified granuloma in the right mid lung. No acute airspace disease. No pleural fluid or pneumothorax. There is gaseous gastric distension. Bones are under mineralized without acute osseous abnormality. IMPRESSION: 1.  Low lung volumes without acute abnormality. 2. Gaseous gastric distension. Electronically Signed   By: Narda Rutherford M.D.   On: 12/10/2020 18:40    Procedures .Critical Care  Date/Time: 11/30/2020 8:14 PM Performed by: Maxwell Caul, PA-C Authorized by: Maxwell Caul, PA-C   Critical care provider statement:    Critical care time (minutes):  35   Critical care was necessary to treat or prevent imminent or life-threatening deterioration of the following conditions:  Respiratory failure   Critical care was time spent personally by me on the following activities:  Discussions with consultants, evaluation of patient's response to treatment, examination of patient, ordering and performing treatments and interventions, ordering and review of laboratory studies, ordering and review of radiographic studies, pulse oximetry, re-evaluation of patient's condition, obtaining history from patient or surrogate and review of old charts   I assumed direction of critical care for this patient from another provider in my specialty: yes     Medications Ordered in ED Medications  HYDROmorphone (DILAUDID) injection 0.5 mg (has no administration in time range)  LORazepam (ATIVAN) tablet 1 mg (has no administration in time range)    Or  LORazepam (ATIVAN) 2 MG/ML concentrated solution 1 mg (has no administration in time range)    Or  LORazepam (ATIVAN) injection 1 mg (has no administration in time range)  ondansetron (ZOFRAN-ODT) disintegrating tablet 4 mg (has no administration in time range)    Or  ondansetron (ZOFRAN) injection 4 mg (has no administration in time range)  glycopyrrolate (ROBINUL) tablet 1 mg (has no administration in time range)    Or  glycopyrrolate (ROBINUL) injection 0.2 mg (has no administration in time range)    Or  glycopyrrolate (ROBINUL) injection 0.2 mg (has no administration in time range)  sodium chloride 0.9 % bolus 1,000 mL (0 mLs Intravenous Stopped 12/09/2020 2002)     ED Course  I have reviewed the triage vital signs and the nursing notes.  Pertinent labs & imaging results that were available during my care of the patient were reviewed by me and considered in my medical decision making (see chart for details).    MDM Rules/Calculators/A&P                           87 y.o. F BIB EMS for choking just prior to ED arrival.  Per EMS, patient was at dinner at the nursing home when she started choking on a piece of chicken.  On EMS arrival, they were able to get the chicken around.  They do report she was hypoxic afterwards and placed on a nonrebreather which improved O2 sats.  She also was more confused.  Per EMS, at baseline, patient is able to talk.  Initially arrival, she is tachycardic and tachypneic.  Blood pressure is stable.  I have removed her off the nonrebreather during my evaluation.  She did drop to 89%.  I placed her on 2 L of oxygen which improved her O2 sats to 94-96%.  On my evaluation, she is alert and minimally responsive to stimuli.  She cannot answer my questions.  She spontaneously moves her extremities.  She does not have any stridor.  Lung  exam shows diffuse rhonchi.  Given choking, concern for aspiration.  Additionally, EMS reports that she is not at mental baseline.  We will obtain a CT head for evaluation of.  VBG shows pH of 7.2.  CMP shows potassium of 6.3, BUN of 18, creatinine 1.68.  CBC shows leukocytosis of 29.4, hemoglobin of 11.5.  Chest x-ray shows low lung volumes.  There is some gaseous gastric distention noted.  Patient with likely aspiration pneumonia. Will cover with broad spectrum.   RN inform me that patient's daughter was in the room.  I went and discussed with daughter.  Daughter states that patient is not at baseline and that she normally is able to answer questions, alert.  Patient returned from CT head and I noticed that patient's breathing was significantly more labored, more tachypneic.  O2 sats were reading between  70-85% on 2 L.  Patient is DNR.  At this point, ED attending and I had a discussion with daughter.  Daughter decided to make patient comfort care.  I was notified by nurse patient was bradycardic, hypoxic. On my evaluation, patient had no pupillary response.  No pulses.  No heart sounds. Time of death was called at 60.   Portions of this note were generated with Scientist, clinical (histocompatibility and immunogenetics). Dictation errors may occur despite best attempts at proofreading.   Final Clinical Impression(s) / ED Diagnoses Final diagnoses:  Choking, initial encounter    Rx / DC Orders ED Discharge Orders     None        Maxwell Caul, PA-C 12/25/2020 0012    Melene Plan, DO 12/29/2020 1606

## 2020-12-16 NOTE — Progress Notes (Signed)
Chaplain responded to call from ED bridge.  Patient deceased.  Son and daughter and daughter in law bedside.  Chaplain offered words of scripture and prayer. Rev. Lynnell Chad

## 2020-12-16 NOTE — ED Triage Notes (Signed)
Patient Jenna Carrillo from Jenna Carrillo  due to choking on food. Per medic, patient had chicken in throat. Medic endorses that patient was more alert PTA. Upon arrival, patient mentation altered, skin color slightly pallor and currently on NRB at 10L. VS per medic, 200/palp to 120/100, HR 120, initially o2 in 50s but up to 90s with NRB.

## 2020-12-17 DEATH — deceased

## 2021-01-17 DEATH — deceased
